# Patient Record
Sex: Female | Born: 1935 | Race: White | Hispanic: No | Smoking: Former smoker
Health system: Southern US, Community
[De-identification: ages and names within clinical notes are randomized; demographics above are authoritative.]

## PROBLEM LIST (undated history)

## (undated) DIAGNOSIS — I10 Essential (primary) hypertension: Secondary | ICD-10-CM

## (undated) DIAGNOSIS — I495 Sick sinus syndrome: Secondary | ICD-10-CM

## (undated) DIAGNOSIS — E785 Hyperlipidemia, unspecified: Secondary | ICD-10-CM

## (undated) DIAGNOSIS — M1990 Unspecified osteoarthritis, unspecified site: Secondary | ICD-10-CM

## (undated) HISTORY — DX: Essential (primary) hypertension: I10

## (undated) HISTORY — PX: TONSILLECTOMY: SUR1361

## (undated) HISTORY — PX: VESICOVAGINAL FISTULA CLOSURE W/ TAH: SUR271

## (undated) HISTORY — DX: Unspecified osteoarthritis, unspecified site: M19.90

## (undated) HISTORY — PX: ABDOMINAL HYSTERECTOMY: SHX81

## (undated) HISTORY — DX: Sick sinus syndrome: I49.5

## (undated) HISTORY — PX: APPENDECTOMY: SHX54

## (undated) HISTORY — DX: Hyperlipidemia, unspecified: E78.5

---

## 2008-03-02 ENCOUNTER — Encounter: Payer: Self-pay | Admitting: Family Medicine

## 2008-07-07 ENCOUNTER — Ambulatory Visit: Payer: Self-pay | Admitting: Family Medicine

## 2008-07-07 DIAGNOSIS — E785 Hyperlipidemia, unspecified: Secondary | ICD-10-CM | POA: Insufficient documentation

## 2008-07-07 DIAGNOSIS — M810 Age-related osteoporosis without current pathological fracture: Secondary | ICD-10-CM | POA: Insufficient documentation

## 2008-07-07 DIAGNOSIS — R3915 Urgency of urination: Secondary | ICD-10-CM | POA: Insufficient documentation

## 2008-07-07 DIAGNOSIS — G894 Chronic pain syndrome: Secondary | ICD-10-CM | POA: Insufficient documentation

## 2008-07-07 DIAGNOSIS — I10 Essential (primary) hypertension: Secondary | ICD-10-CM | POA: Insufficient documentation

## 2008-07-13 ENCOUNTER — Telehealth: Payer: Self-pay | Admitting: Family Medicine

## 2008-08-12 ENCOUNTER — Telehealth: Payer: Self-pay | Admitting: Family Medicine

## 2008-09-10 ENCOUNTER — Ambulatory Visit: Payer: Self-pay | Admitting: Family Medicine

## 2008-09-10 DIAGNOSIS — F341 Dysthymic disorder: Secondary | ICD-10-CM | POA: Insufficient documentation

## 2008-10-11 ENCOUNTER — Telehealth: Payer: Self-pay | Admitting: Family Medicine

## 2008-11-09 ENCOUNTER — Telehealth: Payer: Self-pay | Admitting: Family Medicine

## 2008-12-10 ENCOUNTER — Telehealth: Payer: Self-pay | Admitting: Family Medicine

## 2009-01-10 ENCOUNTER — Telehealth: Payer: Self-pay | Admitting: Family Medicine

## 2009-01-27 ENCOUNTER — Ambulatory Visit: Payer: Self-pay | Admitting: Family Medicine

## 2009-01-28 LAB — CONVERTED CEMR LAB
Chloride: 96 meq/L (ref 96–112)
GFR calc non Af Amer: 56.92 mL/min (ref >60)
Glucose, Bld: 96 mg/dL (ref 70–99)
Potassium: 4.2 meq/L (ref 3.5–5.1)
Sodium: 131 meq/L — ABNORMAL LOW (ref 135–145)
Vit D, 25-Hydroxy: 25 ng/mL — ABNORMAL LOW (ref 30–89)

## 2009-02-09 ENCOUNTER — Telehealth: Payer: Self-pay | Admitting: *Deleted

## 2009-03-09 ENCOUNTER — Ambulatory Visit: Payer: Self-pay | Admitting: Family Medicine

## 2009-03-10 ENCOUNTER — Telehealth: Payer: Self-pay | Admitting: Family Medicine

## 2009-03-10 LAB — CONVERTED CEMR LAB
CO2: 28 meq/L (ref 19–32)
Chloride: 95 meq/L — ABNORMAL LOW (ref 96–112)
Potassium: 4 meq/L (ref 3.5–5.1)

## 2009-04-06 ENCOUNTER — Ambulatory Visit: Payer: Self-pay | Admitting: Family Medicine

## 2009-04-06 DIAGNOSIS — E871 Hypo-osmolality and hyponatremia: Secondary | ICD-10-CM | POA: Insufficient documentation

## 2009-04-06 DIAGNOSIS — S62109A Fracture of unspecified carpal bone, unspecified wrist, initial encounter for closed fracture: Secondary | ICD-10-CM | POA: Insufficient documentation

## 2009-04-08 ENCOUNTER — Ambulatory Visit (HOSPITAL_BASED_OUTPATIENT_CLINIC_OR_DEPARTMENT_OTHER): Admission: RE | Admit: 2009-04-08 | Discharge: 2009-04-08 | Payer: Self-pay | Admitting: Orthopedic Surgery

## 2009-04-14 ENCOUNTER — Telehealth: Payer: Self-pay | Admitting: Family Medicine

## 2009-05-04 ENCOUNTER — Ambulatory Visit: Payer: Self-pay | Admitting: Family Medicine

## 2009-05-05 ENCOUNTER — Telehealth: Payer: Self-pay | Admitting: Family Medicine

## 2009-05-05 LAB — CONVERTED CEMR LAB: Vit D, 25-Hydroxy: 24 ng/mL — ABNORMAL LOW (ref 30–89)

## 2009-05-12 ENCOUNTER — Telehealth: Payer: Self-pay | Admitting: Family Medicine

## 2009-06-13 ENCOUNTER — Telehealth: Payer: Self-pay | Admitting: Family Medicine

## 2009-07-15 ENCOUNTER — Telehealth: Payer: Self-pay | Admitting: Family Medicine

## 2009-08-15 ENCOUNTER — Telehealth: Payer: Self-pay | Admitting: Family Medicine

## 2009-08-25 ENCOUNTER — Ambulatory Visit: Payer: Self-pay | Admitting: Family Medicine

## 2009-08-25 DIAGNOSIS — F22 Delusional disorders: Secondary | ICD-10-CM | POA: Insufficient documentation

## 2009-08-26 ENCOUNTER — Telehealth: Payer: Self-pay | Admitting: Family Medicine

## 2009-08-26 LAB — CONVERTED CEMR LAB
ALT: 20 units/L (ref 0–35)
Basophils Relative: 0.3 % (ref 0.0–3.0)
Bilirubin, Direct: 0.1 mg/dL (ref 0.0–0.3)
CO2: 30 meq/L (ref 19–32)
Chloride: 105 meq/L (ref 96–112)
Eosinophils Relative: 0.8 % (ref 0.0–5.0)
HCT: 36.4 % (ref 36.0–46.0)
Hemoglobin: 12.6 g/dL (ref 12.0–15.0)
Lymphs Abs: 2.3 10*3/uL (ref 0.7–4.0)
MCV: 97 fL (ref 78.0–100.0)
Monocytes Absolute: 0.8 10*3/uL (ref 0.1–1.0)
Neutro Abs: 5.1 10*3/uL (ref 1.4–7.7)
RBC: 3.75 M/uL — ABNORMAL LOW (ref 3.87–5.11)
Sodium: 143 meq/L (ref 135–145)
Total Protein: 6.9 g/dL (ref 6.0–8.3)
Vit D, 25-Hydroxy: 33 ng/mL (ref 30–89)
WBC: 8.3 10*3/uL (ref 4.5–10.5)

## 2009-09-13 ENCOUNTER — Telehealth: Payer: Self-pay | Admitting: Family Medicine

## 2009-09-16 ENCOUNTER — Telehealth: Payer: Self-pay | Admitting: Family Medicine

## 2009-10-17 ENCOUNTER — Telehealth: Payer: Self-pay | Admitting: Family Medicine

## 2009-11-16 ENCOUNTER — Telehealth: Payer: Self-pay | Admitting: Family Medicine

## 2009-11-26 ENCOUNTER — Encounter: Payer: Self-pay | Admitting: Internal Medicine

## 2009-12-16 ENCOUNTER — Telehealth: Payer: Self-pay | Admitting: Family Medicine

## 2009-12-22 ENCOUNTER — Telehealth: Payer: Self-pay | Admitting: Family Medicine

## 2010-01-18 ENCOUNTER — Telehealth: Payer: Self-pay | Admitting: Family Medicine

## 2010-01-23 ENCOUNTER — Ambulatory Visit: Payer: Self-pay | Admitting: Family Medicine

## 2010-01-23 DIAGNOSIS — R5383 Other fatigue: Secondary | ICD-10-CM

## 2010-01-23 DIAGNOSIS — D485 Neoplasm of uncertain behavior of skin: Secondary | ICD-10-CM | POA: Insufficient documentation

## 2010-01-23 DIAGNOSIS — R5381 Other malaise: Secondary | ICD-10-CM | POA: Insufficient documentation

## 2010-01-24 LAB — CONVERTED CEMR LAB
Basophils Absolute: 0 10*3/uL (ref 0.0–0.1)
Calcium: 8.7 mg/dL (ref 8.4–10.5)
GFR calc non Af Amer: 36.69 mL/min (ref >60)
Glucose, Bld: 94 mg/dL (ref 70–99)
Hemoglobin: 12.8 g/dL (ref 12.0–15.0)
Lymphocytes Relative: 34.3 % (ref 12.0–46.0)
Monocytes Relative: 10.2 % (ref 3.0–12.0)
Neutro Abs: 4 10*3/uL (ref 1.4–7.7)
Platelets: 258 10*3/uL (ref 150.0–400.0)
RDW: 12.9 % (ref 11.5–14.6)
Sodium: 135 meq/L (ref 135–145)
Vit D, 25-Hydroxy: 39 ng/mL (ref 30–89)

## 2010-01-26 ENCOUNTER — Encounter: Payer: Self-pay | Admitting: Internal Medicine

## 2010-01-26 ENCOUNTER — Ambulatory Visit: Payer: Self-pay

## 2010-01-26 ENCOUNTER — Encounter: Admission: RE | Admit: 2010-01-26 | Discharge: 2010-01-26 | Payer: Self-pay | Admitting: Family Medicine

## 2010-01-26 ENCOUNTER — Encounter: Payer: Self-pay | Admitting: Family Medicine

## 2010-02-01 ENCOUNTER — Ambulatory Visit: Payer: Self-pay | Admitting: Family Medicine

## 2010-02-01 ENCOUNTER — Telehealth: Payer: Self-pay | Admitting: Family Medicine

## 2010-02-08 ENCOUNTER — Encounter: Admission: RE | Admit: 2010-02-08 | Discharge: 2010-02-08 | Payer: Self-pay | Admitting: Family Medicine

## 2010-02-15 ENCOUNTER — Ambulatory Visit: Payer: Self-pay | Admitting: Family Medicine

## 2010-02-17 ENCOUNTER — Telehealth: Payer: Self-pay | Admitting: Family Medicine

## 2010-03-17 ENCOUNTER — Telehealth: Payer: Self-pay | Admitting: Family Medicine

## 2010-03-22 ENCOUNTER — Telehealth: Payer: Self-pay | Admitting: Family Medicine

## 2010-03-29 ENCOUNTER — Ambulatory Visit
Admission: RE | Admit: 2010-03-29 | Discharge: 2010-03-29 | Payer: Self-pay | Source: Home / Self Care | Attending: Family Medicine | Admitting: Family Medicine

## 2010-03-29 DIAGNOSIS — G4700 Insomnia, unspecified: Secondary | ICD-10-CM | POA: Insufficient documentation

## 2010-04-02 ENCOUNTER — Encounter: Payer: Self-pay | Admitting: Family Medicine

## 2010-04-11 NOTE — Progress Notes (Signed)
Summary: Pt req refill of Oxycontin 60mg  #90 tid, last filled 5/9  Phone Note Call from Patient Call back at [phone] Dianna   Caller: daughter in law - Dianna Summary of Call: Pt needs refill of Oxycontin 60mg  #90 three times a day.   Pls call when ready for pick up.  Initial call taken by: Braulio Bosch,  August 15, 2009 8:16 AM  Follow-up for Phone Call        #90 last filled on 5/9. Nira Conn LPN  June  6, 624THL [id number] AM   Additional Follow-up for Phone Call Additional follow up Details #1::        will refill Additional Follow-up by: Carolann Littler MD,  August 15, 2009 12:24 PM    Additional Follow-up for Phone Call Additional follow up Details #2::    [name] informed , she will pick-up Follow-up by: Nira Conn LPN,  June  6, 624THL 624THL PM  Prescriptions: OXYCONTIN 60 MG XR12H-TAB (OXYCODONE HCL) three times a day  #90 x 0   Entered and Authorized by:   Carolann Littler MD   Signed by:   Carolann Littler MD on 08/15/2009   Method used:   Print then Give to Patient   RxID:   [id number]

## 2010-04-11 NOTE — Procedures (Signed)
Summary: pacer check/[employer] jude   Current Medications (verified): 1)  Vitamin D 50000 Unit Caps (Ergocalciferol) .... One Tab Weekly 2)  Carvedilol 12.5 Mg Tabs (Carvedilol) .... One By Mouth Two Times A Day 3)  Hydralazine Hcl 25 Mg Tabs (Hydralazine Hcl) .... Two Times A Day 4)  Oxycontin 60 Mg Xr12h-Tab (Oxycodone Hcl) .... Three Times A Day 5)  Adult Aspirin Ec Low Strength 81 Mg Tbec (Aspirin) .... Once Daily 6)  Boniva 150 Mg Tabs (Ibandronate Sodium) .... One Tab Monthly 7)  Calcium Gluconate 500 Mg Tabs (Calcium Gluconate) .... Once Daily 8)  Centrum Silver Ultra Womens  Tabs (Multiple Vitamins-Minerals) .... Once Daily 9)  Haloperidol 1 Mg Tabs (Haloperidol) .... One By Mouth At Bedtime  Allergies (verified): No Known Drug Allergies  PPM Specifications Following MD:  Cristopher Peru, MD     Referring MD:  Romeo Apple, MD PPM Vendor:  [facility] Jude     PPM Model Number:  [id number]     PPM Serial Number:  [id number] PPM DOI:  08/27/2006     PPM Implanting MD:  NOT IMPLANTED BY [place]  Lead 1    Location: RA     DOI: 08/27/2006     Model #: [id number]     Serial #: [id number]     Status: active Lead 2    Location: RV     DOI: 08/27/2006     Model #: [id number]     Serial #: [id number]     Status: active  Magnet Response Rate:  BOL 98.6 ERI 86.3  Indications:  Loletha Grayer   PPM Follow Up Remote Check?  No Battery Voltage:  2.73 V     Battery Est. Longevity:  1 years     Pacer Dependent:  No       PPM Device Measurements Atrium  Amplitude: 1.4 mV, Impedance: 471 ohms, Threshold: 0.625 V at 0.4 msec Right Ventricle  Amplitude: 6.1 mV, Impedance: 756 ohms, Threshold: 1.375 V at 0.8 msec  Episodes MS Episodes:  0     Percent Mode Switch:  0     Coumadin:  No Ventricular High Rate:  4     Atrial Pacing:  85%     Ventricular Pacing:  <1%  Parameters Mode:  DDDR     Lower Rate Limit:  60     Upper Rate Limit:  110 Paced AV Delay:  275     Sensed AV Delay:  250 Next Cardiology Appt Due:  04/12/2010 Tech  Comments:  No parameter changes. Device function normal.  Battery nearing ERI.  4 VHR episodes 3-5 seconds.  ROV 3 months with Dr. Lovena Le. Alma Friendly, LPN  November 17, 624THL 2:28 PM

## 2010-04-11 NOTE — Assessment & Plan Note (Signed)
Summary: SKIN LESION EXCISION/BX/NJR   Vital Signs:  Patient profile:   77 year old female Weight:      154 pounds Temp:     98.8 degrees F oral BP sitting:   122 / 62  (left arm) Cuff size:   regular  Vitals Entered By: Nira Conn LPN (November 23, 624THL 11:30 AM)  History of Present Illness: Patient seen for skin lesion excision right wrist. Noted about one month ago. Nonpainful. No personal history of skin cancer. Concerning for possible squamous cell CA  Allergies (verified): No Known Drug Allergies  Past History:  Past Medical History: Last updated: 01/27/2009 Arthritis Hypertension Hyperlipidemia Osteoporosis sick sinus syndrome ? hx cva  Physical Exam  General:  Well-developed,well-nourished,in no acute distress; alert,appropriate and cooperative throughout examination Lungs:  Normal respiratory effort, chest expands symmetrically. Lungs are clear to auscultation, no crackles or wheezes. Heart:  normal rate and regular rhythm.   Skin:  patient has nodular lesion which is approximately 8 mm diameter right wrist dorsally. She has somewhat ulcerative surface. No pigment change   Impression & Recommendations:  Problem # 1:  NEOPLASM OF UNCERTAIN BEHAVIOR OF SKIN (ICD-238.2) risk and benefits of shave excision right wrist lesion discussed. Patient consented.  Anesth 1% Xylocaine with epinephrine. Prepped with Betadine. Using #15 blade shave excision without difficulty. Minimal bleeding controlled with drysol. Antibiotic dressing applied. Specimen sent to pathology for further evaluation Orders: Shave Skin Lesion 0.6-1.0 cm/trunk/arm/leg (11301)  Complete Medication List: 1)  Vitamin D 50000 Unit Caps (Ergocalciferol) .... One tab weekly 2)  Carvedilol 12.5 Mg Tabs (Carvedilol) .... One by mouth two times a day 3)  Hydralazine Hcl 25 Mg Tabs (Hydralazine hcl) .... Two times a day 4)  Oxycontin 60 Mg Xr12h-tab (Oxycodone hcl) .... Three times a day 5)  Adult  Aspirin Ec Low Strength 81 Mg Tbec (Aspirin) .... Once daily 6)  Boniva 150 Mg Tabs (Ibandronate sodium) .... One tab monthly 7)  Calcium Gluconate 500 Mg Tabs (Calcium gluconate) .... Once daily 8)  Centrum Silver Ultra Womens Tabs (Multiple vitamins-minerals) .... Once daily 9)  Haloperidol 1 Mg Tabs (Haloperidol) .... One by mouth at bedtime  Patient Instructions: 1)  Keep wound dry for the first 24 hours then clean with soap and water. 2)  Topical antibiotic daily for one week and keep covered with bandage for 4-5 dayd   Orders Added: 1)  Shave Skin Lesion 0.6-1.0 cm/trunk/arm/leg [11301]

## 2010-04-11 NOTE — Miscellaneous (Signed)
Summary: Device preload  Clinical Lists Changes  Observations: Added new observation of PPM INDICATN: Brady (11/26/2009 14:45) Added new observation of MAGNET RTE: BOL 98.6 ERI 86.3 (11/26/2009 14:45) Added new observation of PPMLEADSTAT2: active (11/26/2009 14:45) Added new observation of PPMLEADSER2: [id number] (11/26/2009 14:45) Added new observation of PPMLEADMOD2: 1788TC (11/26/2009 14:45) Added new observation of PPMLEADDOI2: 08/27/2006 (11/26/2009 14:45) Added new observation of PPMLEADLOC2: RV (11/26/2009 14:45) Added new observation of PPMLEADSTAT1: active (11/26/2009 14:45) Added new observation of PPMLEADSER1: [id number] (11/26/2009 14:45) Added new observation of PPMLEADMOD1: 1788TC (11/26/2009 14:45) Added new observation of PPMLEADDOI1: 08/27/2006 (11/26/2009 14:45) Added new observation of PPMLEADLOC1: RA (11/26/2009 14:45) Added new observation of PPM IMP MD: NOT IMPLANTED BY [place] (11/26/2009 14:45) Added new observation of PPM DOI: 08/27/2006 (11/26/2009 14:45) Added new observation of PPM SERL#: [id number]  (11/26/2009 14:45) Added new observation of PPM MODL#: [id number]  (11/26/2009 14:45) Added new observation of PACEMAKERMFG: [facility]  (11/26/2009 14:45) Added new observation of PPM REFER MD: Romeo Apple, MD  (11/26/2009 14:45) Added new observation of PACEMAKER MD: Cristopher Peru, MD  (11/26/2009 14:45)      PPM Specifications Following MD:  Cristopher Peru, MD     Referring MD:  Romeo Apple, MD PPM Vendor:  [facility]     PPM Model Number:  [id number]     PPM Serial Number:  [id number] PPM DOI:  08/27/2006     PPM Implanting MD:  NOT IMPLANTED BY [place]  Lead 1    Location: RA     DOI: 08/27/2006     Model #: [id number]     Serial #: [id number]     Status: active Lead 2    Location: RV     DOI: 08/27/2006     Model #: [id number]     Serial #: [id number]     Status: active  Magnet Response Rate:  BOL 98.6 ERI 86.3  Indications:  Heather Jarvis

## 2010-04-11 NOTE — Assessment & Plan Note (Signed)
Summary: CPX (PT WILL COME IN FASTING) // RS/PTS DAUGHTER RSC/CJR   Vital Signs:  Patient profile:   77 year old female Height:      60.5 inches Weight:      152 pounds Temp:     98.8 degrees F oral Pulse rate:   72 / minute Pulse rhythm:   regular Resp:     12 per minute BP sitting:   130 / 68  (left arm) Cuff size:   regular  Vitals Entered By: Nira Conn LPN (November 14, 624THL 10:37 AM)  History of Present Illness: Pt here for medicare wellness visit and multiple med problem follow up.  Here for Medicare AWV:  1.   Risk factors based on Past M, S, F history:  Hx osteoporosis, depression, osteoarthritis, hypertension, hyperlipidemia, and delusional disorder controlled with Haldol.  FH of heart disease and diabetes.  Pt has prior hx of ?sick sinus syndrome and ?past hx of CVA. 2.   Physical Activities: walks twice daily for exercise. 3.   Depression/mood: past hx of depression but currently stable. 4.   Hearing: no gross defecits. 5.   ADL's: Independent in all ADLs. 6.   Fall Risk: risks include osteoarthritis and some mild visual difficulty.  No recent falls and steady on feet. 7.   Home Safety: No major concerns identified.  Discussed fall risks such as throw rugs. 8.   Height, weight, &visual acuity:vision tested here today and 20/50 OD and OS but pt did not have here glasses with her (she uses bifocals)  Weight and height are stable. 9.   Counseling: regular weight bearing exercise.  Need for yearly mammogram.  No need for pap as hx hysterctomy for benign diseasse. 10.   Labs ordered based on risk factors: Vit D, CBC, and BMP. 11.           Referral Coordination  no referrals needed at this time.  She is scheduled to see me back for skin lesion excision. 12.           Care Plan  Flu vaccine given,  labs as above.  Skin lesion excision to be scheduled.  Discussed calcium and Vit D use.  Needs f/u DEXA and will schedule with mammogram later this month 13.            Cognitive  Assessment  oriented to time, place, and person.  No short term memory defecits  Osteoporosis hx on Boniva and takes regularly along with Ca and Vit D.  No recent falls. No recent DEXA.  Walks for exercise.  Hx chronic low back pain and has been on Oxycontin for years with good control.  No constipation or other side effects.  Past hx of some mostly nightime delusions which have been well controlled on low dose Haldol.  no side effects but pt and daughter in law state she is not sleepingas well and request increase in dose.  No extrapyramidal side effects.  hypertension on mult meds with no side effects.  New problem of R wrist dorsal skin nodule present for at least a few weeks and seems to be  growing rapidly.  Nonpainful.  Hypertension History:      She denies headache, chest pain, palpitations, dyspnea with exertion, orthopnea, peripheral edema, visual symptoms, syncope, and side effects from treatment.  She notes no problems with any antihypertensive medication side effects.        Positive major cardiovascular risk factors include female age 35 years old or  older, hyperlipidemia, and hypertension.  Negative major cardiovascular risk factors include non-tobacco-user status.     Clinical Review Panels:  Immunizations   Last Flu Vaccine:  Fluvax 3+ (01/23/2010)   Last Pneumovax:  given (01/15/2007)   Allergies (verified): No Known Drug Allergies  Past History:  Past Medical History: Last updated: 01/27/2009 Arthritis Hypertension Hyperlipidemia Osteoporosis sick sinus syndrome ? hx cva  Past Surgical History: Last updated: 07/07/2008 Appendectomy Hysterectomy Tonsillectomy  Family History: Last updated: 01/27/2009  Family History Hypertension mother, sister Heart disease, mother, brother Diabetes, mother  Social History: Last updated: 07/07/2008 Retired Widow/Widower Never Smoked Alcohol use-no  Risk Factors: Smoking Status: never  (07/07/2008) PMH-FH-SH reviewed for relevance  Review of Systems  The patient denies anorexia, fever, weight loss, weight gain, vision loss, decreased hearing, hoarseness, chest pain, syncope, dyspnea on exertion, peripheral edema, prolonged cough, headaches, hemoptysis, abdominal pain, melena, hematochezia, severe indigestion/heartburn, hematuria, muscle weakness, depression, enlarged lymph nodes, and breast masses.         has some fatigue issues off and on.  Sleeping is erratic but delusions have been controlled.  Physical Exam  General:  Well-developed,well-nourished,in no acute distress; alert,appropriate and cooperative throughout examination Head:  Normocephalic and atraumatic without obvious abnormalities. No apparent alopecia or balding. Eyes:  pupils equal, pupils round, and pupils reactive to light.   Ears:  cerumen R ear Mouth:  Oral mucosa and oropharynx without lesions or exudates.  Teeth in good repair. Neck:  No deformities, masses, or tenderness noted. Chest Wall:  She has implanted portacath L upper chest wall from prior hospitalization few years ago. Breasts:  No mass, nodules, thickening, tenderness, bulging, retraction, inflamation, nipple discharge or skin changes noted.   Lungs:  Normal respiratory effort, chest expands symmetrically. Lungs are clear to auscultation, no crackles or wheezes. Heart:  normal rate and regular rhythm.   Abdomen:  Bowel sounds positive,abdomen soft and non-tender without masses, organomegaly or hernias noted. Genitalia:  hx TAH Msk:  No deformity or scoliosis noted of thoracic or lumbar spine.   Extremities:  No clubbing, cyanosis, edema, or deformity noted with normal full range of motion of all joints.   Neurologic:  alert & oriented X3, cranial nerves II-XII intact, strength normal in all extremities, and gait normal.   Skin:  R wrist nodular lesion about 40mm with ulcerative surface. Cervical Nodes:  No lymphadenopathy noted Psych:   Oriented X3, normally interactive, good eye contact, not anxious appearing, and not depressed appearing.     Impression & Recommendations:  Problem # 1:  Preventive Health Care (ICD-V70.0) flu vaccine given.  mammogram has been scheduled and add DEXA, no indication for pap.  Problem # 2:  DELUSIONAL DISORDER (ICD-297.1) controlled but poor sleep.  Titrate to 1 mg at bedtime.  Problem # 3:  DEPRESSION/ANXIETY (ICD-300.4) Assessment: Unchanged stable.  Problem # 4:  CHRONIC PAIN SYNDROME (ICD-338.4) stable on chronic pain meds.  Problem # 5:  OSTEOPOROSIS (ICD-733.00) samples Boniva given.  check Vit D level. adn repeat DEXA. Her updated medication list for this problem includes:    Vitamin D 50000 Unit Caps (Ergocalciferol) ..... One tab weekly    Boniva 150 Mg Tabs (Ibandronate sodium) ..... One tab monthly    Calcium Gluconate 500 Mg Tabs (Calcium gluconate) ..... Once daily  Orders: T-Vitamin D (25-Hydroxy) [id number]) Venipuncture [id number]) Specimen Handling (99000)  Problem # 6:  HYPERTENSION (ICD-401.9)  Her updated medication list for this problem includes:    Carvedilol 12.5 Mg Tabs (Carvedilol) .Marland KitchenMarland KitchenMarland KitchenMarland Kitchen  One by mouth two times a day    Hydralazine Hcl 25 Mg Tabs (Hydralazine hcl) .Marland Kitchen..Marland Kitchen Two times a day    Lisinopril-hydrochlorothiazide 20-12.5 Mg Tabs (Lisinopril-hydrochlorothiazide) ..... One by mouth daily  Orders: TLB-BMP (Basic Metabolic Panel-BMET) (99991111)  Problem # 7:  NEOPLASM OF UNCERTAIN BEHAVIOR OF SKIN (ICD-238.2) Assessment: New pt will schedule for skin bx.  Suspect possible squamous cell cancer.  Complete Medication List: 1)  Vitamin D 50000 Unit Caps (Ergocalciferol) .... One tab weekly 2)  Carvedilol 12.5 Mg Tabs (Carvedilol) .... One by mouth two times a day 3)  Hydralazine Hcl 25 Mg Tabs (Hydralazine hcl) .... Two times a day 4)  Oxycontin 60 Mg Xr12h-tab (Oxycodone hcl) .... Three times a day 5)  Adult Aspirin Ec Low Strength 81 Mg Tbec  (Aspirin) .... Once daily 6)  Lisinopril-hydrochlorothiazide 20-12.5 Mg Tabs (Lisinopril-hydrochlorothiazide) .... One by mouth daily 7)  Boniva 150 Mg Tabs (Ibandronate sodium) .... One tab monthly 8)  Citalopram Hydrobromide 20 Mg Tabs (Citalopram hydrobromide) .... One by mouth once daily 9)  Calcium Gluconate 500 Mg Tabs (Calcium gluconate) .... Once daily 10)  Centrum Silver Ultra Womens Tabs (Multiple vitamins-minerals) .... Once daily 11)  Haloperidol 1 Mg Tabs (Haloperidol) .... One by mouth at bedtime  Other Orders: Flu Vaccine 22yrs + MEDICARE PATIENTS [id number]) Administration Flu vaccine - MCR (G0008) TLB-CBC Platelet - w/Differential (85025-CBCD) Medicare -1st Annual Wellness Visit [phone])  Hypertension Assessment/Plan:      The patient's hypertensive risk group is category B: At least one risk factor (excluding diabetes) with no target organ damage.  Today's blood pressure is 130/68.    Patient Instructions: 1)  Schedule follow up in 2-3 weeks for skin lesion excision/bx. Prescriptions: HALOPERIDOL 1 MG TABS (HALOPERIDOL) one by mouth at bedtime  #30 x 6   Entered and Authorized by:   Carolann Littler MD   Signed by:   Carolann Littler MD on 01/23/2010   Method used:   Electronically to        [facility]. [phone]* (retail)       [place].       [place], [place]  [address]       Ph: [phone]       Fax: [phone]   RxID:   [id number]    Orders Added: 1)  Flu Vaccine 32yrs + MEDICARE PATIENTS [Q2039] 2)  Administration Flu vaccine - MCR [id number] 3)  T-Vitamin D (25-Hydroxy) [id number] 4)  Venipuncture [id number])  Specimen Handling [id number])  TLB-BMP (Basic Metabolic Panel-BMET) [id number] 7)  TLB-CBC Platelet - w/Differential [85025-CBCD] 8)  Medicare -1st Annual Wellness Visit [id number] 9)  Est. Patient Level IV [id number]   Flu Vaccine Consent Questions     Do you have a history of severe allergic reactions  to this vaccine? no    Any prior history of allergic reactions to egg and/or gelatin? no    Do you have a sensitivity to the preservative Thimersol? no    Do you have a past history of Guillan-Barre Syndrome? no    Do you currently have an acute febrile illness? no    Have you ever had a severe reaction to latex? no    Vaccine information given and explained to patient? yes    Are you currently pregnant? no    Lot Number:[id number]   Exp Date:09/09/2010   Site Given  Left Deltoid [email or web address]   Preventive Care Screening  Last Flu Shot:    Date:  01/23/2010    Results:  Fluvax 3+  Last Pneumovax:    Date:  01/15/2007    Results:  given

## 2010-04-11 NOTE — Progress Notes (Signed)
Summary: Pt req refill of Oxycotin 60mg   Phone Note Call from Patient Call back at Home Phone [phone]   Caller: daughter in law - Dianna Summary of Call: Pt req refill Oxycotin 60mg . Please call when ready for pick up.  Initial call taken by: Braulio Bosch,  April 14, 2009 9:32 AM  Follow-up for Phone Call        One tab three times a day, #90 RF 0, last filled 1-3 Nira Conn LPN  February  3, 624THL 9:55 AM will refil Follow-up by: Carolann Littler MD,  April 14, 2009 10:07 AM  Additional Follow-up for Phone Call Additional follow up Details #1::        Daughter in law [name] informed Rx ready for pick-up Additional Follow-up by: Nira Conn LPN,  February  3, 624THL 10:11 AM    Prescriptions: OXYCONTIN 60 MG XR12H-TAB (OXYCODONE HCL) three times a day  #90 x 0   Entered and Authorized by:   Carolann Littler MD   Signed by:   Carolann Littler MD on 04/14/2009   Method used:   Print then Give to Patient   RxID:   [id number]

## 2010-04-11 NOTE — Assessment & Plan Note (Signed)
Summary: fu on meds/njr/pt rsc/cjr   Vital Signs:  Patient profile:   77 year old female Weight:      153 pounds BMI:     29.50 Temp:     98.3 degrees F oral Pulse rate:   60 / minute Pulse rhythm:   regular Resp:     12 per minute BP sitting:   120 / 68  (left arm) Cuff size:   regular  Vitals Entered By: Nira Conn LPN (June 16, 624THL D34-534 AM)  Nutrition Counseling: Patient's BMI is greater than 25 and therefore counseled on weight management options.  History of Present Illness:  follow multiple medical problems as follows.  hypertension treated with multiple medications. Compliant with medications. No side effects. Family requesting prescription for home blood pressure monitor.  Chronic pain syndrome treated with OxyContin. Pain well controlled. They have tried reducing this to twice daily but she's had significant breakthrough pain. No history of medication misuse. They never controlled with over-the-counter medications.  History osteoporosis. Low vitamin D levels. Takes Boniva monthly along with calcium and vitamin D. She is on prescription vitamin D. Last vitamin D level repeat in 24.  History of hyponatremia sodium 130 months ago. Low-dose diuretic use. Good appetite and intake.  Daughter-in-law and patient relate a history of delusions. She has had these for several years but never mentioned this previously. First noted when she lived in [place]. She frequently hears voices but has no hallucinations visually. She has delusional thoughts of someone being under the house and someone putting stuff in her hair frequently. Daughter-in-law relates when she lived in [place] one point she boarded up sec to fear. Chronic poor sleep. No history of being treated for delusional disorder. Symptoms wax and wane.    Allergies (verified): No Known Drug Allergies  Past History:  Past Medical History: Last updated:  01/27/2009 Arthritis Hypertension Hyperlipidemia Osteoporosis sick sinus syndrome ? hx cva  Past Surgical History: Last updated: 07/07/2008 Appendectomy Hysterectomy Tonsillectomy  Family History: Last updated: 01/27/2009  Family History Hypertension mother, sister Heart disease, mother, brother Diabetes, mother  Social History: Last updated: 07/07/2008 Retired Widow/Widower Never Smoked Alcohol use-no  Risk Factors: Smoking Status: never (07/07/2008)  Review of Systems  The patient denies anorexia, fever, weight loss, weight gain, chest pain, syncope, dyspnea on exertion, peripheral edema, prolonged cough, headaches, hemoptysis, abdominal pain, melena, hematochezia, severe indigestion/heartburn, hematuria, and muscle weakness.    Physical Exam  General:  Well-developed,well-nourished,in no acute distress; alert,appropriate and cooperative throughout examination Head:  Normocephalic and atraumatic without obvious abnormalities. No apparent alopecia or balding. Eyes:  No corneal or conjunctival inflammation noted. EOMI. Perrla. Funduscopic exam benign, without hemorrhages, exudates or papilledema. Vision grossly normal. Ears:  External ear exam shows no significant lesions or deformities.  Otoscopic examination reveals clear canals, tympanic membranes are intact bilaterally without bulging, retraction, inflammation or discharge. Hearing is grossly normal bilaterally. Mouth:  Oral mucosa and oropharynx without lesions or exudates.  Teeth in good repair. Neck:  No deformities, masses, or tenderness noted. Lungs:  Normal respiratory effort, chest expands symmetrically. Lungs are clear to auscultation, no crackles or wheezes. Heart:  normal rate and regular rhythm.  soft [id number] systolic murmur right upper sternal border Abdomen:  soft and non-tender.   Extremities:  no edema Neurologic:  alert & oriented X3, cranial nerves II-XII intact, and gait normal.   Psych:  Oriented  X3, normally interactive, good eye contact, not depressed appearing, and slightly anxious.     Impression &  Recommendations:  Problem # 1:  HYPERTENSION (ICD-401.9) stable and at goal. Her updated medication list for this problem includes:    Carvedilol 12.5 Mg Tabs (Carvedilol) ..... One by mouth two times a day    Hydralazine Hcl 25 Mg Tabs (Hydralazine hcl) .Marland Kitchen..Marland Kitchen Two times a day    Lisinopril-hydrochlorothiazide 20-12.5 Mg Tabs (Lisinopril-hydrochlorothiazide) ..... One by mouth daily  Orders: Prescription Created Electronically [phone]) TLB-BMP (Basic Metabolic Panel-BMET) (99991111)  Problem # 2:  OSTEOPOROSIS (ICD-733.00)  Her updated medication list for this problem includes:    Vitamin D 50000 Unit Caps (Ergocalciferol) ..... One tab weekly    Boniva 150 Mg Tabs (Ibandronate sodium) ..... One tab monthly    Calcium Gluconate 500 Mg Tabs (Calcium gluconate) ..... Once daily  Orders: T-Vitamin D (25-Hydroxy) [phone]) Venipuncture HR:[id number])  Problem # 3:  HYPONATREMIA (ICD-276.1) repeat BMP. Orders: TLB-BMP (Basic Metabolic Panel-BMET) (99991111)  Problem # 4:  DELUSIONAL DISORDER (ICD-297.1) Assessment: New Discussed with the patient and daughter-in-law consideration for referral to psychiatry and they will consider. Orders: TLB-TSH (Thyroid Stimulating Hormone) (84443-TSH) TLB-CBC Platelet - w/Differential (85025-CBCD) TLB-Hepatic/Liver Function Pnl (80076-HEPATIC) Venipuncture HR:[id number])  Problem # 5:  DEPRESSION/ANXIETY (ICD-300.4) continue citalopram  Complete Medication List: 1)  Vitamin D 50000 Unit Caps (Ergocalciferol) .... One tab weekly 2)  Carvedilol 12.5 Mg Tabs (Carvedilol) .... One by mouth two times a day 3)  Hydralazine Hcl 25 Mg Tabs (Hydralazine hcl) .... Two times a day 4)  Oxycontin 60 Mg Xr12h-tab (Oxycodone hcl) .... Three times a day 5)  Adult Aspirin Ec Low Strength 81 Mg Tbec (Aspirin) .... Once daily 6)   Lisinopril-hydrochlorothiazide 20-12.5 Mg Tabs (Lisinopril-hydrochlorothiazide) .... One by mouth daily 7)  Boniva 150 Mg Tabs (Ibandronate sodium) .... One tab monthly 8)  Citalopram Hydrobromide 20 Mg Tabs (Citalopram hydrobromide) .... One by mouth once daily 9)  Calcium Gluconate 500 Mg Tabs (Calcium gluconate) .... Once daily 10)  Centrum Silver Ultra Womens Tabs (Multiple vitamins-minerals) .... Once daily  Patient Instructions: 1)  Please schedule a follow-up appointment in 3 months .  Prescriptions: HYDRALAZINE HCL 25 MG TABS (HYDRALAZINE HCL) two times a day  #180 Tablet x 3   Entered and Authorized by:   Carolann Littler MD   Signed by:   Carolann Littler MD on 08/25/2009   Method used:   Electronically to        [facility]. [phone]* (retail)       [address]       [place], [place]  [address]       Ph: [phone]       Fax: [phone]   [id number] CITALOPRAM HYDROBROMIDE 20 MG TABS (CITALOPRAM HYDROBROMIDE) one by mouth once daily  #90 x 3   Entered and Authorized by:   Carolann Littler MD   Signed by:   Carolann Littler MD on 08/25/2009   Method used:   Electronically to        [facility]. [phone]* (retail)       [address].       [place], [place]  [address]       Ph: [phone]       Fax: [phone]   RxID:   [id number] CARVEDILOL 12.5 MG TABS (CARVEDILOL) one by mouth two times a day  #60 x 11   Entered and Authorized by:   Carolann Littler  MD   Signed by:   Carolann Littler MD on 08/25/2009   Method used:   Electronically to        [facility]. [phone]* (retail)       [address].       [place], [place]  [address]       Ph: [phone]       Fax: [phone]   RxID:   [id number]

## 2010-04-11 NOTE — Progress Notes (Signed)
Summary: new rx  Phone Note Refill Request Message from:  Patient  Refills Requested: Medication #1:  OXYCONTIN 60 MG XR12H-TAB three times a day please call carl (son) when ready for pick up [phone]. pt last refill on 11-16-2009  Initial call taken by: Glo Herring,  December 16, 2009 9:51 AM  Follow-up for Phone Call        done Follow-up by: Laurey Morale MD,  December 16, 2009 2:15 PM  Additional Follow-up for Phone Call Additional follow up Details #1::        informed son rx at front desk. informed if came today had to be here by 5:15  verbalized understanding  Additional Follow-up by: Levora Angel, RN,  December 16, 2009 4:29 PM    Prescriptions: OXYCONTIN 60 MG XR12H-TAB (OXYCODONE HCL) three times a day  #90 x 0   Entered and Authorized by:   Laurey Morale MD   Signed by:   Laurey Morale MD on 12/16/2009   Method used:   Print then Give to Patient   RxID:   [id number]

## 2010-04-11 NOTE — Progress Notes (Signed)
Summary:  rx Oxycontin refill  Phone Note Call from Patient Call back at Home Phone [phone] Call back at Work Phone [phone]   Caller: Other Relative Call For: Carolann Littler MD Summary of Call: pt need new rx for oxycontin 60 mg Initial call taken by: Glo Herring,  Jul 15, 2009 8:59 AM  Follow-up for Phone Call        will refill Follow-up by: Carolann Littler MD,  Jul 15, 2009 10:14 AM  Additional Follow-up for Phone Call Additional follow up Details #1::        Family informed Rx ready for pick-up, OK for [name] to pick-up Additional Follow-up by: Nira Conn LPN,  May  6, 624THL D34-534 AM    Prescriptions: OXYCONTIN 60 MG XR12H-TAB (OXYCODONE HCL) three times a day  #90 x 0   Entered and Authorized by:   Carolann Littler MD   Signed by:   Carolann Littler MD on 07/15/2009   Method used:   Print then Give to Patient   RxID:   [id number]

## 2010-04-11 NOTE — Progress Notes (Signed)
Summary: REFILL REQUEST Oxycontin  Phone Note Refill Request Call back at Work Phone [phone] Message from:  Patient's Dghtr-n-law [name]) on January 18, 2010 3:06 PM  Refills Requested: Medication #1:  [id number] MG XR12H-TAB three times a day   Notes: Pts dghtr in law [name]) can be reached at [phone] when Rx is ready for p/u.    Initial call taken by: Duanne Moron,  January 18, 2010 3:06 PM  Follow-up for Phone Call        Last filled 10/7 Nira Conn LPN  November  9, 624THL 4:10 PM will refill.  Pt needs to schedule office f/u Follow-up by: Carolann Littler MD,  January 19, 2010 12:30 PM  Additional Follow-up for Phone Call Additional follow up Details #1::        Pt daughter in law informed, has OV scheduled for next week Additional Follow-up by: Nira Conn LPN,  November 10, 624THL 1:46 PM    Prescriptions: OXYCONTIN 60 MG XR12H-TAB (OXYCODONE HCL) three times a day  #90 x 0   Entered and Authorized by:   Carolann Littler MD   Signed by:   Carolann Littler MD on 01/19/2010   Method used:   Print then Give to Patient   RxID:   [id number]

## 2010-04-11 NOTE — Progress Notes (Signed)
Summary: REFILL REQUEST Haloperidol  Phone Note Call from Patient   Caller: Daughter-N-Law Summary of Call: Pts daughter-n-law [name]) called to adv that she has made appt for pt on 11/3  at  10:30am for cpx / labwork..... She is currently out of state taking care of her terminally ill mother and can't bring pt (callers mother-n-law) in till she returns the week of 11/3..... Req that Rx for enough of medication (HALOPERIDOL 0.5 MG) be sent to the pharmacy ([facility], [address]) till she can bring pt in for cpx appt..... George Ina she can be reached at [phone] with any questions or concerns.  Initial call taken by: Duanne Moron,  December 22, 2009 3:49 PM  Follow-up for Phone Call        Last filled on 9/13 [facility] LPN  October 13, 624THL 4:52 PM OK to refill once until follow up,. Follow-up by: Carolann Littler MD,  December 22, 2009 5:59 PM  Additional Follow-up for Phone Call Additional follow up Details #1::        Daughter in law informed Additional Follow-up by: Nira Conn LPN,  October 14, 624THL 11:26 AM    Prescriptions: HALOPERIDOL 0.5 MG TABS (HALOPERIDOL) one by mouth at bedtime.  #30 Tablet x 0   Entered by:   Nira Conn LPN   Authorized by:   Carolann Littler MD   Signed by:   Nira Conn LPN on [id number]   Method used:   Electronically to        [facility]. [phone]* (retail)       [address].       [place], [place]  [address]       Ph: [phone]       Fax: [phone]   RxID:   [id number]

## 2010-04-11 NOTE — Progress Notes (Signed)
Summary: Order for DEXA Scan  Order for DEXA Scan   Imported By: Laural Benes 01/25/2010 15:35:46  _____________________________________________________________________  External Attachment:    Type:   Image     Comment:   External Document

## 2010-04-11 NOTE — Progress Notes (Signed)
Summary: Office staff cannot find Rx Oxycodone, reprinted  Phone Note Call from Patient   Caller: Patient Call For: Carolann Littler MD Summary of Call: Pt son here to pick-up Oxycodone, front office staff cannot find it.  Re-Printed Initial call taken by: Nira Conn LPN,  July  8, 624THL 579FGE PM  Follow-up for Phone Call        Dr Elease Hashimoto signed, rx given to son as instructed Follow-up by: Nira Conn LPN,  July  8, 624THL 624THL PM    Prescriptions: OXYCONTIN 60 MG XR12H-TAB (OXYCODONE HCL) three times a day  #90 x 0   Entered by:   Nira Conn LPN   Authorized by:   Carolann Littler MD   Signed by:   Nira Conn LPN on 075-GRM   Method used:   Print then Give to Patient   RxID:   [id number]

## 2010-04-11 NOTE — Progress Notes (Signed)
Summary: return call regarding Vit D  Phone Note Call from Patient   Caller: Daughter in law [name] Call For: Izora Gala Summary of Call: returning your call   [phone] Initial call taken by: Chipper Oman, RN,  May 05, 2009 9:10 AM  Follow-up for Phone Call        Daughter in law report pt is consistantly taking the 50,000 units of Vit D weekly and 1,000 units daily already. Follow-up by: Nira Conn LPN,  February 24, 624THL 9:35 AM  Additional Follow-up for Phone Call Additional follow up Details #1::        increase daily to 2,000 International Units and recheck 3 months Additional Follow-up by: Carolann Littler MD,  May 05, 2009 11:46 AM    Additional Follow-up for Phone Call Additional follow up Details #2::    [name] informed, they will recheck Vit D in 3 months Follow-up by: Nira Conn LPN,  February 24, 624THL 12:31 PM

## 2010-04-11 NOTE — Progress Notes (Signed)
Summary: Pt req script for Oxycontin 60mg    Phone Note Refill Request Message from:  daughter-Dianna on September 13, 2009 1:28 PM  Refills Requested: Medication #1:  OXYCONTIN 60 MG XR12H-TAB three times a day   Dosage confirmed as above?Dosage Confirmed  Method Requested: Pick up at Office Initial call taken by: Braulio Bosch,  September 13, 2009 1:28 PM  Follow-up for Phone Call        Last filled 08-15-2009 Nira Conn LPN  July  6, 624THL [id number] AM refill Follow-up by: Carolann Littler MD,  September 15, 2009 8:10 AM  Additional Follow-up for Phone Call Additional follow up Details #1::        Daughter [name] informed RX ready for pick-up.  Naida Sleight (s0n) authorized to pick-up Additional Follow-up by: Nira Conn LPN,  July  7, 624THL 579FGE AM    Prescriptions: OXYCONTIN 60 MG XR12H-TAB (OXYCODONE HCL) three times a day  #90 x 0   Entered and Authorized by:   Carolann Littler MD   Signed by:   Carolann Littler MD on 09/15/2009   Method used:   Print then Give to Patient   RxID:   [id number]

## 2010-04-11 NOTE — Progress Notes (Signed)
Summary: Refill Oxycodone request  Phone Note Call from Patient Call back at Home Phone [phone]   Caller: Daughter-diana Call For: Carolann Littler MD Summary of Call: pt needs new rx oxycontin 60mg  Initial call taken by: Glo Herring,  October 17, 2009 8:40 AM  Follow-up for Phone Call        refill Follow-up by: Carolann Littler MD,  October 17, 2009 1:12 PM  Additional Follow-up for Phone Call Additional follow up Details #1::        Daughter informed RX ready for pick-up Additional Follow-up by: Nira Conn LPN,  August  8, 624THL 1:37 PM    Prescriptions: OXYCONTIN 60 MG XR12H-TAB (OXYCODONE HCL) three times a day  #90 x 0   Entered and Authorized by:   Carolann Littler MD   Signed by:   Carolann Littler MD on 10/17/2009   Method used:   Print then Give to Patient   RxID:   [id number]

## 2010-04-11 NOTE — Progress Notes (Signed)
Summary: Daughter requesting phone call  Phone Note Call from Patient   Caller: Daughter Call For: Carolann Littler MD Complaint: Breathing Problems Summary of Call: Daughter requesting Dr Elease Hashimoto call to discuss her mother's thoughts [phone] Initial call taken by: Nira Conn LPN,  June 17, 624THL [id number] AM  Follow-up for Phone Call        spoke with daughter-in-law.  She has delusions as noted and is very resistent to idea of seeing a psychiatrist.  Poor sleep at night.  We will try low dose Haldol .5 mg at bedtime and office f/u one month. Follow-up by: Carolann Littler MD,  August 26, 2009 5:50 PM    New/Updated Medications: HALOPERIDOL 0.5 MG TABS (HALOPERIDOL) one by mouth at bedtime. Prescriptions: HALOPERIDOL 0.5 MG TABS (HALOPERIDOL) one by mouth at bedtime.  #30 x 1   Entered and Authorized by:   Carolann Littler MD   Signed by:   Carolann Littler MD on 08/26/2009   Method used:   Electronically to        [facility]. [phone]* (retail)       [address].       [place], [place]  [address]       Ph: [phone]       Fax: [phone]   RxID:   [id number]

## 2010-04-11 NOTE — Progress Notes (Signed)
Summary: Pt needs refill of Oxycontin 40mg  XR  Phone Note Refill Request Message from:  daughter in law [name] on February 17, 2010 8:10 AM  Refills Requested: Medication #1:  OXYCONTIN 40 MG XR12H-TAB one by mouth three times a day   Dosage confirmed as above?Dosage Confirmed   Supply Requested: 1 month  Method Requested: Pick up at Office Initial call taken by: Braulio Bosch,  February 17, 2010 8:11 AM  Follow-up for Phone Call        will refill Follow-up by: Carolann Littler MD,  February 17, 2010 10:35 AM  Additional Follow-up for Phone Call Additional follow up Details #1::        daughter in law aware and will pick up  Additional Follow-up by: Levora Angel, RN,  February 17, 2010 10:50 AM    Prescriptions: OXYCONTIN 40 MG XR12H-TAB (OXYCODONE HCL) one by mouth three times a day  #90 x 0   Entered and Authorized by:   Carolann Littler MD   Signed by:   Carolann Littler MD on 02/17/2010   Method used:   Print then Give to Patient   RxID:   [id number]

## 2010-04-11 NOTE — Progress Notes (Signed)
Summary: refill Oxycontin, last filled 05/12/09  Phone Note Refill Request Call back at Home Phone [phone] Message from:  daughter in law----diana  Refills Requested: Medication #1:  OXYCONTIN 60 MG XR12H-TAB three times a day call when ready.  Initial call taken by: Despina Arias,  June 13, 2009 1:59 PM  Follow-up for Phone Call        Last refill #90, 0 refills on 05/12/2009 Nira Conn LPN  April  4, 624THL QA348G PM refil Follow-up by: Carolann Littler MD,  June 14, 2009 7:50 AM  Additional Follow-up for Phone Call Additional follow up Details #1::        Pt daughter in [place] informed Additional Follow-up by: Nira Conn LPN,  April  5, 624THL 8:29 AM    Prescriptions: OXYCONTIN 60 MG XR12H-TAB (OXYCODONE HCL) three times a day  #90 x 0   Entered and Authorized by:   Carolann Littler MD   Signed by:   Carolann Littler MD on 06/14/2009   Method used:   Print then Give to Patient   RxID:   [id number]

## 2010-04-11 NOTE — Assessment & Plan Note (Signed)
Summary: Follow-up low sodium/nn   Vital Signs:  Patient profile:   77 year old female Weight:      153 pounds Temp:     99.7 degrees F oral BP sitting:   146 / 80  (left arm) Cuff size:   regular  Vitals Entered By: Nira Conn LPN (January 26, 624THL 1:16 PM) CC: follow-up low sodium, fell last night, left arm pain   History of Present Illness: Patient seen for the following items today.  Hypertension treated with lisinopril HCTZ, hydralazine, and carvedilol.  In November we increased her lisinopril HCTZ. Recent sodium low at 130. Good appetite and intake. Daughter-in-law restricts sodium in her diet generally.  Denies any weakness, nausea, headaches or other complaints. No recent orthostatic symptoms, dizziness, or chest pains.  Tripped on a rug and fell last night. Left wrist pain and swelling and ecchymosis. Also complains of some dorsal left foot pain but no swelling there. Ambulating okay. No head injury. Denies any elbow pain, shoulder pain, or hip pain.  Allergies: No Known Drug Allergies  Past History:  Past Medical History: Last updated: 01/27/2009 Arthritis Hypertension Hyperlipidemia Osteoporosis sick sinus syndrome ? hx cva  Past Surgical History: Last updated: 07/07/2008 Appendectomy Hysterectomy Tonsillectomy  Social History: Last updated: 07/07/2008 Retired Widow/Widower Never Smoked Alcohol use-no  Review of Systems  The patient denies anorexia, fever, weight loss, weight gain, vision loss, chest pain, syncope, dyspnea on exertion, peripheral edema, prolonged cough, headaches, hemoptysis, abdominal pain, and muscle weakness.    Physical Exam  General:  patient is alert cooperative and in no distress Head:  Normocephalic and atraumatic without obvious abnormalities. No apparent alopecia or balding. Mouth:  Oral mucosa and oropharynx without lesions or exudates.  Teeth in good repair. Neck:  No deformities, masses, or tenderness noted. Lungs:   Normal respiratory effort, chest expands symmetrically. Lungs are clear to auscultation, no crackles or wheezes. Heart:  normal rate and regular rhythm.   Extremities:  left wrist real significant edema and ecchymosis. She has tenderness on the distal radius. Normal range of motion left elbow. No hand tenderness.left foot reveals no visible swelling or ecchymosis. She has nonspecific tenderness along the proximal aspect of the fourth and fifth metatarsals Neurologic:  no focal strength deficits. Gait normal   Impression & Recommendations:  Problem # 1:  WRIST PAIN (ICD-719.43) clinically suspicious for frature.  get x-rays. Orders: T-Wrist Comp Left Min 3 Views ([id number])  Problem # 2:  FOOT PAIN (ICD-729.5)  Orders: T-Foot Left Min 3 Views ([id number])  Problem # 3:  HYPONATREMIA (ICD-276.1) discussed possible discontinuation of diuretic but patient is asymptomatic and has good oral intake and levels relatively stable. Reassess in 2 months  Problem # 4:  HYPERTENSION (ICD-401.9) Assessment: Improved  Her updated medication list for this problem includes:    Carvedilol 25 Mg Tabs (Carvedilol) ..... Once daily    Hydralazine Hcl 25 Mg Tabs (Hydralazine hcl) .Marland Kitchen..Marland Kitchen Two times a day    Lisinopril-hydrochlorothiazide 20-12.5 Mg Tabs (Lisinopril-hydrochlorothiazide) ..... One by mouth daily  Complete Medication List: 1)  Vitamin D 50000 Unit Caps (Ergocalciferol) .... One tab weekly 2)  Carvedilol 25 Mg Tabs (Carvedilol) .... Once daily 3)  Hydralazine Hcl 25 Mg Tabs (Hydralazine hcl) .... Two times a day 4)  Oxycontin 60 Mg Xr12h-tab (Oxycodone hcl) .... Three times a day 5)  Adult Aspirin Ec Low Strength 81 Mg Tbec (Aspirin) .... Once daily 6)  Lisinopril-hydrochlorothiazide 20-12.5 Mg Tabs (Lisinopril-hydrochlorothiazide) .... One by mouth daily 7)  Boniva 150 Mg Tabs (Ibandronate sodium) .... One tab monthly 8)  Citalopram Hydrobromide 20 Mg Tabs (Citalopram hydrobromide) .... One by  mouth once daily  Patient Instructions: 1)  Please schedule a follow-up appointment in 2 months.  2)  BMP prior to visit, ICD-9: 276.1

## 2010-04-11 NOTE — Progress Notes (Signed)
Summary: Pt req script for Oxycontin 60mg   Phone Note Refill Request Call back at Work Phone [phone] Message from:  dauhter in law - Dianna on November 16, 2009 4:30 PM  Refills Requested: Medication #1:  OXYCONTIN 60 MG XR12H-TAB three times a day   Dosage confirmed as above?Dosage Confirmed Pts son, [name], will be picking up script when ready.     Method Requested: Pick up at Office Initial call taken by: Braulio Bosch,  November 16, 2009 4:31 PM  Follow-up for Phone Call        Last filled 10/17/09 Nira Conn LPN  September  7, 624THL 4:36 PM will refill Follow-up by: Carolann Littler MD,  November 17, 2009 9:34 AM  Additional Follow-up for Phone Call Additional follow up Details #1::        Msg left on Diana's personally identified VM ready for pick-up Additional Follow-up by: Nira Conn LPN,  September  8, 624THL 10:09 AM    Prescriptions: OXYCONTIN 60 MG XR12H-TAB (OXYCODONE HCL) three times a day  #90 x 0   Entered and Authorized by:   Carolann Littler MD   Signed by:   Carolann Littler MD on 11/17/2009   Method used:   Print then Give to Patient   RxID:   [id number]

## 2010-04-11 NOTE — Assessment & Plan Note (Signed)
Summary: fup on pts arm/per dr/cjr   Vital Signs:  Patient profile:   77 year old female Weight:      155 pounds Temp:     98.8 degrees F BP sitting:   110 / 68  (left arm) Cuff size:   regular  Vitals Entered By: Nira Conn LPN (December  7, 624THL 11:08 AM)  History of Present Illness: Patient seen for the following  Recent skin lesion right dorsal wrist. Equivocal diagnosis. Did have some slightly atypical cells possible keratoacanthoma. No clear squamous cell carcinoma. This has healed well no signs of recurrence thus far.  Patient accompanied by daughter-in-law. Daughter-in-law has concerns that she is become progressively less active. No clear depression issues. No falls. Has been on chronic OxyContin 60 mg t.i.d. for several years and the family would like to consider scaling back. She does not show objective signs of pain or discomfort even though she states she is always in severe pain.  Family has not observed grimacing or other behaviors suggestive of pain.  Hypertension History:      She denies headache, chest pain, palpitations, dyspnea with exertion, orthopnea, PND, peripheral edema, visual symptoms, neurologic problems, syncope, and side effects from treatment.        Positive major cardiovascular risk factors include female age 41 years old or older, hyperlipidemia, and hypertension.  Negative major cardiovascular risk factors include non-tobacco-user status.     Allergies (verified): No Known Drug Allergies  Past History:  Past Medical History: Last updated: 01/27/2009 Arthritis Hypertension Hyperlipidemia Osteoporosis sick sinus syndrome ? hx cva  Past Surgical History: Last updated: 07/07/2008 Appendectomy Hysterectomy Tonsillectomy  Family History: Last updated: 01/27/2009  Family History Hypertension mother, sister Heart disease, mother, brother Diabetes, mother  Social History: Last updated: 07/07/2008 Retired Widow/Widower Never  Smoked Alcohol use-no  Risk Factors: Smoking Status: never (07/07/2008) PMH-FH-SH reviewed for relevance  Review of Systems  The patient denies anorexia, fever, weight loss, chest pain, syncope, dyspnea on exertion, peripheral edema, prolonged cough, headaches, hemoptysis, abdominal pain, melena, hematochezia, and severe indigestion/heartburn.    Physical Exam  General:  Well-developed,well-nourished,in no acute distress; alert,appropriate and cooperative throughout examination Mouth:  Oral mucosa and oropharynx without lesions or exudates.  Teeth in good repair. Neck:  No deformities, masses, or tenderness noted. Lungs:  Normal respiratory effort, chest expands symmetrically. Lungs are clear to auscultation, no crackles or wheezes. Heart:  normal rate and regular rhythm.   Skin:  right wrist dorsally reveals very small eschar from previous site of excision of lesion. No recurrence. Cervical Nodes:  No lymphadenopathy noted Psych:  normally interactive, good eye contact, not anxious appearing, and not depressed appearing.     Impression & Recommendations:  Problem # 1:  NEOPLASM OF UNCERTAIN BEHAVIOR OF SKIN (ICD-238.2) follow closely for signs of recurrence.  No clear cancer from bx.  ?early keratoacanthoma.  Problem # 2:  HYPERTENSION (ICD-401.9)  Her updated medication list for this problem includes:    Carvedilol 12.5 Mg Tabs (Carvedilol) ..... One by mouth two times a day    Hydralazine Hcl 25 Mg Tabs (Hydralazine hcl) .Marland Kitchen..Marland Kitchen Two times a day    Lisinopril-hydrochlorothiazide 10-12.5 Mg Tabs (Lisinopril-hydrochlorothiazide) ..... Once daily  Problem # 3:  CHRONIC PAIN SYNDROME (ICD-338.4) we discussed trying to scale back her pain medications to OxyContin 40 mg t.i.d. Reassess in one to 2 months and if stable that point consider dosing to b.i.d.  I feel we need to taper back at her age, esp  if she can tolerate lower doses.  Complete Medication List: 1)  Vitamin D 50000 Unit  Caps (Ergocalciferol) .... One tab weekly 2)  Carvedilol 12.5 Mg Tabs (Carvedilol) .... One by mouth two times a day 3)  Hydralazine Hcl 25 Mg Tabs (Hydralazine hcl) .... Two times a day 4)  Oxycontin 40 Mg Xr12h-tab (Oxycodone hcl) .... One by mouth three times a day 5)  Adult Aspirin Ec Low Strength 81 Mg Tbec (Aspirin) .... Once daily 6)  Boniva 150 Mg Tabs (Ibandronate sodium) .... One tab monthly 7)  Calcium Gluconate 500 Mg Tabs (Calcium gluconate) .... Once daily 8)  Centrum Silver Ultra Womens Tabs (Multiple vitamins-minerals) .... Once daily 9)  Haloperidol 1 Mg Tabs (Haloperidol) .... One by mouth at bedtime 10)  Lisinopril-hydrochlorothiazide 10-12.5 Mg Tabs (Lisinopril-hydrochlorothiazide) .... Once daily 11)  Citalopram Hydrobromide 20 Mg Tabs (Citalopram hydrobromide) .... Once daily  Hypertension Assessment/Plan:      The patient's hypertensive risk group is category B: At least one risk factor (excluding diabetes) with no target organ damage.  Today's blood pressure is 110/68.    Patient Instructions: 1)  Please schedule a follow-up appointment in 2 months.  Prescriptions: CITALOPRAM HYDROBROMIDE 20 MG TABS (CITALOPRAM HYDROBROMIDE) once daily  #90 x 3   Entered by:   Nira Conn LPN   Authorized by:   Carolann Littler MD   Signed by:   Nira Conn LPN on [id number]   Method used:   Electronically to        [facility]. [phone]* (retail)       [address]       [place], [place]  [address]       Ph: [phone]       Fax: [phone]   RxID:   [id number]    Orders Added: 1)  Est. Patient Level IV [id number]

## 2010-04-11 NOTE — Progress Notes (Signed)
Summary: REQ FOR REFILL (OXYCONTIN)  Phone Note Call from Patient   Caller: Daughter-N-Law Shauna Hugh)  [phone] Reason for Call: Refill Medication Summary of Call: Pts dghtr-n-law (Diane) called to adv that pt needs a refill on med: OXYCONTIN 60 MG XR12H-TAB (OXYCODONE HCL)...... She can be reached at [phone] when same is ready for p/u.  Initial call taken by: Duanne Moron,  May 12, 2009 9:25 AM  Follow-up for Phone Call        Last filled on 04/14/09, #90 Nira Conn LPN  March  3, 624THL [id number] AM  Will refill Follow-up by: Carolann Littler MD,  May 12, 2009 10:08 AM  Additional Follow-up for Phone Call Additional follow up Details #1::        Message left on [phone] as requested, Rx ready for pick-up Additional Follow-up by: Nira Conn LPN,  March  3, 624THL 10:13 AM    Prescriptions: OXYCONTIN 60 MG XR12H-TAB (OXYCODONE HCL) three times a day  #90 x 0   Entered and Authorized by:   Carolann Littler MD   Signed by:   Carolann Littler MD on 05/12/2009   Method used:   Print then Give to Patient   RxID:   [id number]

## 2010-04-11 NOTE — Progress Notes (Signed)
Summary: Chg of pain med request, to discuss in private  Phone Note Call from Patient Call back at Home Phone [phone]   Caller: Daughter in law [name] Call For: Carolann Littler MD Summary of Call: Daughter in law [name] requesting a change in pt pain medication (pt not aware of request).  Per pharmacist, Opana is in the same family of meds, quite a bit cheaper.  requesting Opana 40mg  (lower dose also) three times a day time released Initial call taken by: Nira Conn LPN,  November 23, 624THL 12:47 PM  Follow-up for Phone Call        Actually Opana is oxymorphone.  This is an opioid but not the same as oxycodone.  I would rec follow up in 2 weeks and we can diiscuss then.  That would give [place] a chance to evaluate her wound then as well. Follow-up by: Carolann Littler MD,  February 01, 2010 1:26 PM  Additional Follow-up for Phone Call Additional follow up Details #1::        Daughter in law informed.  She wants to discuss med chg with Dr Elease Hashimoto in person, not with pt in the room at F/U visit in 2 weeks.  FYI Additional Follow-up by: Nira Conn LPN,  November 23, 624THL 2:12 PM    Additional Follow-up for Phone Call Additional follow up Details #2::    noted. Follow-up by: Carolann Littler MD,  February 01, 2010 4:44 PM

## 2010-04-13 NOTE — Assessment & Plan Note (Signed)
Summary: f/u to review sleep hygeine and go over options//ALP   Vital Signs:  Patient profile:   77 year old female Weight:      152 pounds Temp:     98.2 degrees F oral BP sitting:   120 / 74  (left arm) Cuff size:   regular  Vitals Entered By: Cay Schillings LPN (January 18, [id number] 10:06 AM) CC: c/o insomnia Is Patient Diabetic? No   History of Present Illness: Insomnia for several weeks.  Difficulty falling asleep and waking up. Goes to bed around 9PM.  Frequently does not feel sleepy when goes to bed.   Watches TV in bed-at night and day according to daughter. No naps.  Some Dr Malachi Bonds at night-caffeine free.  No ETOH. Freq wakes at 4AM.  Gets up 8-9 AM.  No exercise. Daughter in law concerned because she is getting up freq at night. She already takes Haldol and Oxycontin for pain.  Denies any depression.  Chronic pain but she states controlled fairly  well at present.  Daughter in law gave her one Ativan 0.5 mg and that seemed to help.  Allergies (verified): No Known Drug Allergies  Past History:  Past Medical History: Last updated: 01/27/2009 Arthritis Hypertension Hyperlipidemia Osteoporosis sick sinus syndrome ? hx cva  Social History: Last updated: 07/07/2008 Retired Widow/Widower Never Smoked Alcohol use-no PMH reviewed for relevance, SH/Risk Factors reviewed for relevance  Review of Systems  The patient denies anorexia, fever, weight loss, chest pain, syncope, dyspnea on exertion, prolonged cough, headaches, abdominal pain, melena, hematochezia, severe indigestion/heartburn, incontinence, and depression.    Physical Exam  General:  Well-developed,well-nourished,in no acute distress; alert,appropriate and cooperative throughout examination Head:  normocephalic and atraumatic.   Eyes:  pupils equal, pupils round, and pupils reactive to light.   Mouth:  Oral mucosa and oropharynx without lesions or exudates.  Teeth in good repair. Neck:  No  deformities, masses, or tenderness noted. Lungs:  Normal respiratory effort, chest expands symmetrically. Lungs are clear to auscultation, no crackles or wheezes. Heart:  normal rate and regular rhythm.   Neurologic:  alert & oriented X3, cranial nerves II-XII intact, and strength normal in all extremities.   Psych:  Oriented X3, normally interactive, good eye contact, not depressed appearing, and slightly anxious.     Impression & Recommendations:  Problem # 1:  INSOMNIA (ICD-780.52) Assessment Deteriorated sleep hygiene discussed with handout given.  I would like to avoid further sedative hypnotics with her  on Haldol and Oxycontin. She will try melatonin and if no relief Tylenol PM. NO TV in bed.  Go to bed later and regimented time to rise.  recheck one month.  Problem # 2:  CHRONIC PAIN SYNDROME (ICD-338.4) Assessment: Unchanged controlled with current meds.  Complete Medication List: 1)  Vitamin D 50000 Unit Caps (Ergocalciferol) .... One tab weekly 2)  Carvedilol 12.5 Mg Tabs (Carvedilol) .... One by mouth two times a day 3)  Hydralazine Hcl 25 Mg Tabs (Hydralazine hcl) .... Two times a day 4)  Oxycontin 40 Mg Xr12h-tab (Oxycodone hcl) .... One by mouth three times a day 5)  Adult Aspirin Ec Low Strength 81 Mg Tbec (Aspirin) .... Once daily 6)  Boniva 150 Mg Tabs (Ibandronate sodium) .... One tab monthly 7)  Calcium Gluconate 500 Mg Tabs (Calcium gluconate) .... Once daily 8)  Centrum Silver Ultra Womens Tabs (Multiple vitamins-minerals) .... Once daily 9)  Haloperidol 1 Mg Tabs (Haloperidol) .... One by mouth at bedtime 10)  Lisinopril-hydrochlorothiazide 10-12.5  Mg Tabs (Lisinopril-hydrochlorothiazide) .... Once daily 11)  Citalopram Hydrobromide 20 Mg Tabs (Citalopram hydrobromide) .... Once daily  Patient Instructions: 1)  Try over the counter melatonin 2)  No TV in bed. 3)  Go to bed later at night. 4)  Have regular wake up time in morning.   Orders Added: 1)  Est.  Patient Level IV [id number]

## 2010-04-13 NOTE — Progress Notes (Signed)
Summary: Trouble Sleeping  Phone Note Call from Patient   Caller: Daughter-IN lAW Summary of Call: Daughter In-Law called to state that Pt. has not been sleeping wants to get something called in to help her sleep. Please call [phone].  Initial call taken by: Betsy Pries,  March 22, 2010 3:28 PM  Follow-up for Phone Call        she is already on alot of medications and we need to discuss options.  We have to be careful about drug interactions. Have them set up f/u to review sleep hygeine and go over ooptions. Follow-up by: Carolann Littler MD,  March 22, 2010 6:09 PM     They are coming in for an appt. tomm. 1/18/ @ 10am

## 2010-04-13 NOTE — Cardiovascular Report (Signed)
Summary: Office Visit   Office Visit   Imported By: Sallee Provencal 02/20/2010 13:58:57  _____________________________________________________________________  External Attachment:    Type:   Image     Comment:   External Document

## 2010-04-13 NOTE — Progress Notes (Signed)
Summary: new rx oxycontin  Phone Note Refill Request Call back at Home Phone [phone] Message from:  daugh-in law-dianne  Refills Requested: Medication #1:  OXYCONTIN 40 MG XR12H-TAB one by mouth three times a day pt will be out of med on sunday  Initial call taken by: Glo Herring,  March 17, 2010 8:18 AM  Follow-up for Phone Call        will refill Follow-up by: Carolann Littler MD,  March 17, 2010 12:53 PM  Additional Follow-up for Phone Call Additional follow up Details #1::        Daughter [name] informed Additional Follow-up by: Nira Conn LPN,  January  6, [id number] 1:39 PM    Prescriptions: OXYCONTIN 40 MG XR12H-TAB (OXYCODONE HCL) one by mouth three times a day  #90 x 0   Entered and Authorized by:   Carolann Littler MD   Signed by:   Carolann Littler MD on 03/17/2010   Method used:   Print then Give to Patient   RxID:   [id number]

## 2010-04-17 ENCOUNTER — Telehealth: Payer: Self-pay | Admitting: Family Medicine

## 2010-04-17 NOTE — Telephone Encounter (Signed)
Pt needs new rx oxycontin 40 mg. Pt will be out of med today

## 2010-04-18 ENCOUNTER — Telehealth: Payer: Self-pay | Admitting: Family Medicine

## 2010-04-18 DIAGNOSIS — G894 Chronic pain syndrome: Secondary | ICD-10-CM

## 2010-04-18 MED ORDER — OXYCODONE HCL 40 MG PO TB12: 40.0000 mg | ORAL_TABLET | Freq: Three times a day (TID) | ORAL | Status: DC

## 2010-04-18 NOTE — Telephone Encounter (Signed)
Pt completely out of oxycotin. Req script. Pls call pts daughter when ready for pick.

## 2010-04-18 NOTE — Telephone Encounter (Signed)
Last filled #90, 0RF on 03/17/2010

## 2010-04-18 NOTE — Telephone Encounter (Signed)
Will refill.

## 2010-04-18 NOTE — Telephone Encounter (Signed)
Daughter in law [name] informed Rx ready for pick-up

## 2010-04-24 ENCOUNTER — Ambulatory Visit (INDEPENDENT_AMBULATORY_CARE_PROVIDER_SITE_OTHER): Payer: Self-pay | Admitting: Family Medicine

## 2010-04-24 ENCOUNTER — Encounter: Payer: Self-pay | Admitting: Family Medicine

## 2010-04-24 DIAGNOSIS — D485 Neoplasm of uncertain behavior of skin: Secondary | ICD-10-CM

## 2010-04-24 DIAGNOSIS — F22 Delusional disorders: Secondary | ICD-10-CM

## 2010-04-24 DIAGNOSIS — G894 Chronic pain syndrome: Secondary | ICD-10-CM

## 2010-04-24 DIAGNOSIS — M810 Age-related osteoporosis without current pathological fracture: Secondary | ICD-10-CM

## 2010-04-24 DIAGNOSIS — G4700 Insomnia, unspecified: Secondary | ICD-10-CM

## 2010-04-24 MED ORDER — TEMAZEPAM 15 MG PO CAPS: 15.0000 mg | ORAL_CAPSULE | Freq: Every evening | ORAL | Status: DC | PRN

## 2010-04-24 MED ORDER — OXYCODONE HCL 30 MG PO TB12: 30.0000 mg | ORAL_TABLET | Freq: Two times a day (BID) | ORAL | Status: DC

## 2010-04-24 NOTE — Progress Notes (Signed)
  Subjective:    Patient ID: Heather Jarvis, female    DOB: Nov 27, 1928, 77 y.o.   MRN: [id number]  HPI  Patient is seen today for the following several items   she is accompanied by daughter-in-law who notices that she has frequent nasal drainage. She frequently has a habit of sniffing constantly. They've tried Claritin and Benadryl with no relief. She has refused nasal sprays in the past. Has never tried Zyrtec or Allegra. No fever or chills.    History of chronic back pain and chronic pain syndrome. She came to this practice on OxyContin but family questions whether she really has much chronic pain necessitating this amount of medication. We tapered her OxyContin down currently at 40 mg twice daily and will consider tapering again at this time. She has not showed objective evidence of pain.   Chronic insomnia. They've tried melatonin and Benadryl without relief. Sleep hygiene discussed in detail last visit. No alcohol use.   No history of depression. Has used Restoril the past with good success   history of delusions. Stable with none on Haldol 1 mg.   Right neck rash which daughter-in-law states has been present for about a couple of years. Stable in size. Hydrocortisone cream without relief.   Review of Systems     Objective:   Physical Exam  patient is alert cooperative and in no distress  neck exam no thyromegaly or adenopathy Chest clear to auscultation Heart regular rhythm and rate Extremities no pitting edema  Skin exam right posterior neck 0.5 x 1 cm mostly macular to slightly elevated skin lesion which is slightly scaly and a couple of locations. On magnification she has somewhat of the elevated border in places       Assessment & Plan:   #1 chronic insomnia. Try low-dose Restoril 7.5 mg each bedtime. Sleep hygiene again discussed #2 probable perennial allergic rhinitis. Try over-the-counter Zyrtec or Allegra  #3 osteoporosis. Continue Boniva  #4 chronic pain syndrome.  Continue tapering of OxyContin to 30 mg bid. #5 skin rash right posterior neck , rule out basal cell carcinoma. Refer skin surgery Center.

## 2010-04-24 NOTE — Patient Instructions (Signed)
Try  Over-the-counter Allegra or Zyrtec one daily for allergy symptoms  Try addition of low-dose Restoril for sleep difficulties at night  Reduce Haldol to one half tablet at night

## 2010-05-01 ENCOUNTER — Ambulatory Visit: Payer: Self-pay | Admitting: Family Medicine

## 2010-05-02 ENCOUNTER — Encounter: Payer: Self-pay | Admitting: Internal Medicine

## 2010-05-02 ENCOUNTER — Encounter (INDEPENDENT_AMBULATORY_CARE_PROVIDER_SITE_OTHER): Payer: Medicare Other | Admitting: Internal Medicine

## 2010-05-02 DIAGNOSIS — I10 Essential (primary) hypertension: Secondary | ICD-10-CM

## 2010-05-02 DIAGNOSIS — I495 Sick sinus syndrome: Secondary | ICD-10-CM

## 2010-05-02 DIAGNOSIS — I4891 Unspecified atrial fibrillation: Secondary | ICD-10-CM

## 2010-05-02 DIAGNOSIS — Z950 Presence of cardiac pacemaker: Secondary | ICD-10-CM | POA: Insufficient documentation

## 2010-05-09 NOTE — Assessment & Plan Note (Signed)
Summary: device/ck/saf   Visit Type:  Follow-up Primary Provider:  Carolann Littler MD   History of Present Illness: Heather Jarvis is referred today for ongoing PPM evaluation.  She is a pleasant 77 yo woman with a h/o HTN, symptomatic bradycardia, s/p PPM.  She has a h/o dyslipidemia.    Current Medications (verified): 1)  Vitamin D 50000 Unit Caps (Ergocalciferol) .... One Tab Weekly 2)  Carvedilol 12.5 Mg Tabs (Carvedilol) .... One By Mouth Two Times A Day 3)  Hydralazine Hcl 25 Mg Tabs (Hydralazine Hcl) .... Two Times A Day 4)  Oxycontin 40 Mg Xr12h-Tab (Oxycodone Hcl) .... One By Mouth Three Times A Day 5)  Adult Aspirin Ec Low Strength 81 Mg Tbec (Aspirin) .... Once Daily 6)  Boniva 150 Mg Tabs (Ibandronate Sodium) .... One Tab Monthly 7)  Calcium Gluconate 500 Mg Tabs (Calcium Gluconate) .... Once Daily 8)  Centrum Silver Ultra Womens  Tabs (Multiple Vitamins-Minerals) .... Once Daily 9)  Haloperidol 1 Mg Tabs (Haloperidol) .... One By Mouth At Bedtime 10)  Lisinopril-Hydrochlorothiazide 20-12.5 Mg Tabs (Lisinopril-Hydrochlorothiazide) .... Once Daily 11)  Citalopram Hydrobromide 20 Mg Tabs (Citalopram Hydrobromide) .... Once Daily 12)  Temazepam 15 Mg Caps (Temazepam) .... At Bedtime 13)  Oscal 500/200 D-3 500-200 Mg-Unit Tabs (Calcium Carbonate-Vitamin D) .... Two Times A Day 14)  Vitamin D 2000 Unit Tabs (Cholecalciferol) .... Daily  Allergies (verified): No Known Drug Allergies  Past History:  Past Medical History: Last updated: 01/27/2009 Arthritis Hypertension Hyperlipidemia Osteoporosis sick sinus syndrome ? hx cva  Past Surgical History: Last updated: 07/07/2008 Appendectomy Hysterectomy Tonsillectomy  Review of Systems  The patient denies chest pain, syncope, dyspnea on exertion, and peripheral edema.    Vital Signs:  Patient profile:   77 year old female Height:      60.5 inches Weight:      151 pounds BMI:     29.11 Pulse rate:   60 / minute BP  sitting:   104 / 62  (right arm)  Vitals Entered By: Margaretmary Bayley CMA (May 02, 2010 3:08 PM)  Physical Exam  General:  Well-developed,well-nourished,in no acute distress; alert,appropriate and cooperative throughout examination Head:  normocephalic and atraumatic.   Eyes:  pupils equal, pupils round, and pupils reactive to light.   Mouth:  Oral mucosa and oropharynx without lesions or exudates.  Teeth in good repair. Neck:  No deformities, masses, or tenderness noted. Chest Wall:  She has implanted portacath L upper chest wall from prior hospitalization few years ago. Lungs:  Normal respiratory effort, chest expands symmetrically. Lungs are clear to auscultation, no crackles or wheezes. Heart:  normal rate and regular rhythm.  No murmurs. Abdomen:  Bowel sounds positive,abdomen soft and non-tender without masses, organomegaly or hernias noted. Msk:  No deformity or scoliosis noted of thoracic or lumbar spine.   Pulses:  good dorsalis pedis pulses bilaterally Extremities:  No clubbing, cyanosis, edema, or deformity noted with normal full range of motion of all joints.   Neurologic:  alert & oriented X3, cranial nerves II-XII intact, and strength normal in all extremities.     PPM Specifications Following MD:  Cristopher Peru, MD     Referring MD:  Romeo Apple, MD [facility] Vendor:  [facility] Jude     PPM Model Number:  [id number]     PPM Serial Number:  [id number] PPM DOI:  08/27/2006     PPM Implanting MD:  NOT IMPLANTED BY [place]  Lead 1    Location: RA  DOI: 08/27/2006     Model #: [id number]     Serial #: [id number]     Status: active Lead 2    Location: RV     DOI: 08/27/2006     Model #: [id number]     Serial #: [id number]     Status: active  Magnet Response Rate:  BOL 98.6 ERI 86.3  Indications:  Heather Jarvis   PPM Follow Up Pacer Dependent:  No      Episodes Coumadin:  No  Parameters Mode:  DDDR     Lower Rate Limit:  60     Upper Rate Limit:  110 Paced AV Delay:  275     Sensed AV Delay:  250 MD  Comments:  Her device is working normally.  Impression & Recommendations:  Problem # 1:  CARDIAC PACEMAKER IN SITU (ICD-V45.01) Her device is working normally. Will continue her current meds.  Problem # 2:  HYPERTENSION (ICD-401.9) Her blood pressure is well controlled. She will continue her current meds. Her updated medication list for this problem includes:    Carvedilol 12.5 Mg Tabs (Carvedilol) ..... One by mouth two times a day    Hydralazine Hcl 25 Mg Tabs (Hydralazine hcl) .Marland Kitchen..Marland Kitchen Two times a day    Adult Aspirin Ec Low Strength 81 Mg Tbec (Aspirin) ..... Once daily    Lisinopril-hydrochlorothiazide 20-12.5 Mg Tabs (Lisinopril-hydrochlorothiazide) ..... Once daily  Problem # 3:  HYPERLIPIDEMIA (ICD-272.4)  Patient Instructions: 1)  Your physician wants you to follow-up in: 6 months with Dr Knox Saliva will receive a reminder letter in the mail two months in advance. If you don't receive a letter, please call our office to schedule the follow-up appointment. 2)  Your physician recommends that you continue on your current medications as directed. Please refer to the Current Medication list given to you today.

## 2010-05-18 NOTE — Cardiovascular Report (Signed)
Summary: Office Visit   Office Visit   Imported By: Sallee Provencal 05/12/2010 12:51:39  _____________________________________________________________________  External Attachment:    Type:   Image     Comment:   External Document

## 2010-05-21 ENCOUNTER — Other Ambulatory Visit: Payer: Self-pay | Admitting: Family Medicine

## 2010-05-22 ENCOUNTER — Telehealth: Payer: Self-pay | Admitting: Family Medicine

## 2010-05-22 DIAGNOSIS — G894 Chronic pain syndrome: Secondary | ICD-10-CM

## 2010-05-22 NOTE — Telephone Encounter (Signed)
Last filled 04/24/10

## 2010-05-22 NOTE — Telephone Encounter (Signed)
Please advise 

## 2010-05-22 NOTE — Telephone Encounter (Signed)
Refill Oxycontin. Call daughter when ready.

## 2010-05-22 NOTE — Telephone Encounter (Signed)
OK to refill for 6 months 

## 2010-05-23 MED ORDER — OXYCODONE HCL 30 MG PO TB12: 30.0000 mg | ORAL_TABLET | Freq: Two times a day (BID) | ORAL | Status: DC

## 2010-05-23 NOTE — Telephone Encounter (Signed)
Will refill.

## 2010-05-29 LAB — POCT I-STAT, CHEM 8
Chloride: 101 mEq/L (ref 96–112)
HCT: 39 % (ref 36.0–46.0)
Potassium: 3.7 mEq/L (ref 3.5–5.1)
Sodium: 135 mEq/L (ref 135–145)

## 2010-06-22 ENCOUNTER — Telehealth: Payer: Self-pay | Admitting: Family Medicine

## 2010-06-22 DIAGNOSIS — G894 Chronic pain syndrome: Secondary | ICD-10-CM

## 2010-06-22 MED ORDER — OXYCODONE HCL 30 MG PO TB12: 30.0000 mg | ORAL_TABLET | Freq: Two times a day (BID) | ORAL | Status: DC

## 2010-06-22 NOTE — Telephone Encounter (Signed)
Daughter informed Rx ready for pick-up

## 2010-06-22 NOTE — Telephone Encounter (Signed)
OK to refill

## 2010-06-22 NOTE — Telephone Encounter (Signed)
Pt needs new rx oxycodone 30mg 

## 2010-07-03 ENCOUNTER — Ambulatory Visit (INDEPENDENT_AMBULATORY_CARE_PROVIDER_SITE_OTHER): Payer: Medicare Other | Admitting: Family Medicine

## 2010-07-03 ENCOUNTER — Encounter: Payer: Self-pay | Admitting: Family Medicine

## 2010-07-03 VITALS — BP 98/78 | Temp 98.0°F | Wt 147.0 lb

## 2010-07-03 DIAGNOSIS — R4182 Altered mental status, unspecified: Secondary | ICD-10-CM

## 2010-07-03 DIAGNOSIS — G894 Chronic pain syndrome: Secondary | ICD-10-CM

## 2010-07-03 DIAGNOSIS — F341 Dysthymic disorder: Secondary | ICD-10-CM

## 2010-07-03 LAB — BASIC METABOLIC PANEL
CO2: 29 mEq/L (ref 19–32)
Chloride: 93 mEq/L — ABNORMAL LOW (ref 96–112)
Potassium: 4.7 mEq/L (ref 3.5–5.1)
Sodium: 135 mEq/L (ref 135–145)

## 2010-07-03 NOTE — Progress Notes (Signed)
  Subjective:    Patient ID: Heather Jarvis, female    DOB: 1928/07/18, 77 y.o.   MRN: [id number]  HPI Patient is seen accompanied by daughter-in-law with concerns of mental status changes. More gradual than acute. Short-term memory deficits but long term intact. No known history of dementia. Daughter and all has noticed that she is having more struggles with activities of daily living such as self hygiene and toileting. She is still able to dress and feed herself without difficulty. She's had some chronic sleep difficulties which are unchanged.  She does have history of depression but no recent crying spells. No appetite or weight changes. Her chronic problems include history of osteoporosis, chronic insomnia, hypertension, hyperlipidemia, history of depression, cardiac pacemaker and chronic pain syndrome. We have been slowly tapering back her OxyContin. She had moved here fairly high dosage and we've been able to taper down  No recent head injury. No fever or chills. Rare complaints of headache. No focal weakness. No speech difficulty.   Review of Systems  Constitutional: Negative for fever, chills, activity change, appetite change and unexpected weight change.  HENT: Negative for sore throat, trouble swallowing and voice change.   Eyes: Negative for visual disturbance.  Respiratory: Negative for cough, shortness of breath and wheezing.   Cardiovascular: Negative for chest pain, palpitations and leg swelling.  Gastrointestinal: Negative for nausea, vomiting, abdominal pain, diarrhea and blood in stool.  Genitourinary: Negative for dysuria.  Musculoskeletal: Positive for back pain. Negative for joint swelling.  Neurological: Negative for dizziness, seizures, syncope and facial asymmetry.  Hematological: Negative for adenopathy.  Psychiatric/Behavioral: Positive for confusion. Negative for agitation.       Objective:   Physical Exam  Constitutional: She is oriented to person, place, and time.  She appears well-developed and well-nourished. No distress.  Neck: Neck supple. No thyromegaly present.  Cardiovascular: Normal rate, regular rhythm and normal heart sounds.  Exam reveals no gallop.   Pulmonary/Chest: Effort normal and breath sounds normal. No respiratory distress. She has no wheezes. She has no rales.  Musculoskeletal: She exhibits no edema.  Lymphadenopathy:    She has no cervical adenopathy.  Neurological: She is alert and oriented to person, place, and time. She displays normal reflexes. No cranial nerve deficit. She exhibits normal muscle tone. Coordination normal.       Mini-Mental Status exam is 23/30. No prior comparison  Skin: No rash noted.  Psychiatric: She has a normal mood and affect. Her behavior is normal.          Assessment & Plan:  #1 mental status changes. More short-term memory deficit. Gradual changes. Suspect probably some early dementia. Recent thyroid functions okay. Repeat electrolytes. Check B12. CT of the head without contrast. Consider Aricept if all above normal #2 chronic pain syndrome. Try continued tapering of OxyContin.  We will plan to reduce to 20 mg twice daily at next refill. Daughter-in-law has not seen any objective evidence that she is in increased pain since we have tapered her medicines down

## 2010-07-04 NOTE — Progress Notes (Signed)
Quick Note:  Daughter informed ______

## 2010-07-07 ENCOUNTER — Ambulatory Visit (INDEPENDENT_AMBULATORY_CARE_PROVIDER_SITE_OTHER)
Admission: RE | Admit: 2010-07-07 | Discharge: 2010-07-07 | Disposition: A | Discharge status: Home / Self Care | Payer: Medicare Other | Source: Ambulatory Visit | Attending: Family Medicine | Admitting: Family Medicine

## 2010-07-07 DIAGNOSIS — R4182 Altered mental status, unspecified: Secondary | ICD-10-CM

## 2010-07-12 NOTE — Progress Notes (Signed)
patient  Is aware.  Spoke with daughter in law [name]

## 2010-07-24 ENCOUNTER — Telehealth: Payer: Self-pay | Admitting: Family Medicine

## 2010-07-24 MED ORDER — OXYCODONE HCL 20 MG PO TB12: 20.0000 mg | ORAL_TABLET | Freq: Two times a day (BID) | ORAL | Status: DC

## 2010-07-24 NOTE — Telephone Encounter (Signed)
OK to refill if this has been one month

## 2010-07-24 NOTE — Telephone Encounter (Signed)
Pt needs new rx oxycodone 20mg  . Pt is out of med

## 2010-07-24 NOTE — Telephone Encounter (Signed)
Last filled on 4/12 #60 with 0 refills.  Rx printed, signed, pt informed

## 2010-07-24 NOTE — Telephone Encounter (Signed)
Pt called to check on status of refill. Pls call.

## 2010-07-31 ENCOUNTER — Ambulatory Visit (INDEPENDENT_AMBULATORY_CARE_PROVIDER_SITE_OTHER): Payer: Medicare Other | Admitting: Family Medicine

## 2010-07-31 ENCOUNTER — Encounter: Payer: Self-pay | Admitting: Family Medicine

## 2010-07-31 DIAGNOSIS — F09 Unspecified mental disorder due to known physiological condition: Secondary | ICD-10-CM

## 2010-07-31 MED ORDER — DONEPEZIL HCL 5 MG PO TABS: 5.0000 mg | ORAL_TABLET | Freq: Every evening | ORAL | Status: DC | PRN

## 2010-07-31 NOTE — Progress Notes (Signed)
  Subjective:    Patient ID: Heather Jarvis, female    DOB: 1928/04/18, 77 y.o.   MRN: [id number]  HPI Patient seen for followup cognitive changes. Refer to prior note. Patient scored 23/30 Mini-Mental status exam. CT of head unremarkable. B12 normal. Previous thyroid function normal. Electrolytes unremarkable. No acute changes since last visit. Has been seen by dermatologist for multiple skin lesions had several cancers which were removed and are healing well. Patient denies depressive symptoms   Review of Systems  Constitutional: Negative for fever and chills.  Respiratory: Negative for cough and shortness of breath.   Cardiovascular: Negative for chest pain, palpitations and leg swelling.  Gastrointestinal: Negative for abdominal pain.  Neurological: Negative for dizziness, seizures, syncope, weakness and headaches.  Hematological: Negative for adenopathy. Does not bruise/bleed easily.  Psychiatric/Behavioral: Negative for agitation.       Objective:   Physical Exam  Constitutional: She is oriented to person, place, and time. She appears well-developed and well-nourished.  HENT:  Right Ear: External ear normal.  Left Ear: External ear normal.  Mouth/Throat: Oropharynx is clear and moist. No oropharyngeal exudate.  Cardiovascular: Normal rate, regular rhythm and normal heart sounds.   Pulmonary/Chest: Effort normal and breath sounds normal. No respiratory distress. She has no rales.  Musculoskeletal: She exhibits no edema.  Lymphadenopathy:    She has no cervical adenopathy.  Neurological: She is alert and oriented to person, place, and time.  Psychiatric: She has a normal mood and affect.          Assessment & Plan:  The patient has history of cognitive decline. Suspect early dementia. Initiate Aricept 5 mg daily. Reviewed potential side effects. Reassess one month.  Will titrate to 10 mg then as tolerated

## 2010-08-16 ENCOUNTER — Other Ambulatory Visit: Payer: Self-pay | Admitting: Family Medicine

## 2010-08-23 ENCOUNTER — Other Ambulatory Visit: Payer: Self-pay | Admitting: Family Medicine

## 2010-08-23 MED ORDER — OXYCODONE HCL 20 MG PO TB12: 20.0000 mg | ORAL_TABLET | Freq: Two times a day (BID) | ORAL | Status: DC

## 2010-08-23 NOTE — Telephone Encounter (Signed)
Ok to refill 

## 2010-08-23 NOTE — Telephone Encounter (Signed)
Rx printed, signed, pt informed ready for pick-up

## 2010-08-23 NOTE — Telephone Encounter (Signed)
Refill Oxycodone 

## 2010-08-23 NOTE — Telephone Encounter (Signed)
Last filled #60 with 0 refills on 5/14

## 2010-09-07 ENCOUNTER — Ambulatory Visit: Payer: Medicare Other | Admitting: Family Medicine

## 2010-09-15 ENCOUNTER — Other Ambulatory Visit: Payer: Self-pay | Admitting: Family Medicine

## 2010-09-21 ENCOUNTER — Telehealth: Payer: Self-pay | Admitting: Family Medicine

## 2010-09-21 NOTE — Telephone Encounter (Signed)
Last refill done on 08/23/10.

## 2010-09-21 NOTE — Telephone Encounter (Signed)
Refill Oxycodone 20mg .

## 2010-09-21 NOTE — Telephone Encounter (Signed)
OK to refill

## 2010-09-22 MED ORDER — OXYCODONE HCL 20 MG PO TB12: 20.0000 mg | ORAL_TABLET | Freq: Two times a day (BID) | ORAL | Status: DC

## 2010-09-22 NOTE — Telephone Encounter (Signed)
Daughter informed Rx ready for pick-up

## 2010-09-27 ENCOUNTER — Other Ambulatory Visit: Payer: Self-pay | Admitting: Family Medicine

## 2010-09-28 ENCOUNTER — Encounter: Payer: Self-pay | Admitting: Internal Medicine

## 2010-10-18 ENCOUNTER — Other Ambulatory Visit: Payer: Self-pay | Admitting: *Deleted

## 2010-10-18 MED ORDER — DONEPEZIL HCL 5 MG PO TABS: 5.0000 mg | ORAL_TABLET | Freq: Every evening | ORAL | Status: DC | PRN

## 2010-10-23 ENCOUNTER — Telehealth: Payer: Self-pay | Admitting: Family Medicine

## 2010-10-23 NOTE — Telephone Encounter (Signed)
Ok to refill 

## 2010-10-23 NOTE — Telephone Encounter (Signed)
Last filled #60 on 7/13

## 2010-10-23 NOTE — Telephone Encounter (Signed)
Pt needs refill of oxyCODONE (OXYCONTIN) 20 MG 12 hr tablet

## 2010-10-24 MED ORDER — OXYCODONE HCL 20 MG PO TB12: 20.0000 mg | ORAL_TABLET | Freq: Two times a day (BID) | ORAL | Status: DC

## 2010-10-24 NOTE — Telephone Encounter (Signed)
Daughter [name] informed on cell VM ready to pick-up

## 2010-11-01 ENCOUNTER — Ambulatory Visit (INDEPENDENT_AMBULATORY_CARE_PROVIDER_SITE_OTHER): Payer: Medicare Other | Admitting: Internal Medicine

## 2010-11-01 ENCOUNTER — Encounter: Payer: Self-pay | Admitting: Internal Medicine

## 2010-11-01 DIAGNOSIS — I498 Other specified cardiac arrhythmias: Secondary | ICD-10-CM

## 2010-11-01 DIAGNOSIS — I10 Essential (primary) hypertension: Secondary | ICD-10-CM

## 2010-11-01 DIAGNOSIS — Z950 Presence of cardiac pacemaker: Secondary | ICD-10-CM

## 2010-11-01 NOTE — Patient Instructions (Signed)
Your physician wants you to follow-up in: 6 months with Zacarias Pontes for a device check & 1 year with Dr. Lovena Le. You will receive a reminder letter in the mail two months in advance. If you don't receive a letter, please call our office to schedule the follow-up appointment.  Your physician recommends that you continue on your current medications as directed. Please refer to the Current Medication list given to you today.

## 2010-11-01 NOTE — Progress Notes (Signed)
HPI Mrs. Fontanez returns today for followup. She is a pleasant 77 year old woman with a history of hypertension and symptomatic bradycardia. She is status post permanent pacemaker insertion. She has dementia. The patient denies chest pain or shortness of breath. She has difficulty sleeping which has been a chronic problem. She denies peripheral edema. No syncope. No Known Allergies   Current Outpatient Prescriptions  Medication Sig Dispense Refill  . aspirin 81 MG tablet Take 81 mg by mouth daily.        . calcium-vitamin D (OSCAL WITH D) 500-200 MG-UNIT per tablet Take 1 tablet by mouth 2 (two) times daily.        . carvedilol (COREG) 12.5 MG tablet take 1 tablet by mouth twice a day  60 tablet  11  . Cholecalciferol (VITAMIN D) 2000 UNITS CAPS Take by mouth daily.        . citalopram (CELEXA) 20 MG tablet Take 20 mg by mouth daily.        . ergocalciferol (VITAMIN D2) 50000 UNITS capsule Take 50,000 Units by mouth once a week.        . haloperidol (HALDOL) 1 MG tablet One half tab At bedtime      . hydrALAZINE (APRESOLINE) 25 MG tablet take 1 tablet by mouth twice a day  180 tablet  3  . ibandronate (BONIVA) 150 MG tablet Take 150 mg by mouth every 30 (thirty) days. Take in the morning with a full glass of water, on an empty stomach, and do not take anything else by mouth or lie down for the next 30 min.       Marland Kitchen lisinopril-hydrochlorothiazide (PRINZIDE,ZESTORETIC) 20-12.5 MG per tablet take 1 tablet by mouth once daily  90 tablet  2  . Multiple Vitamins-Minerals (CENTRUM SILVER PO) Take by mouth daily.        Marland Kitchen oxyCODONE (OXYCONTIN) 20 MG 12 hr tablet Take 1 tablet (20 mg total) by mouth every 12 (twelve) hours.  60 tablet  0  . temazepam (RESTORIL) 15 MG capsule take 1 capsule by mouth at bedtime if needed for sleep  30 capsule  5  . donepezil (ARICEPT) 5 MG tablet Take 1 tablet (5 mg total) by mouth at bedtime as needed.  90 tablet  2     Past Medical History  Diagnosis Date  . Arthritis     . Hyperlipidemia   . Hypertension   . Osteoporosis   . Sick sinus syndrome     ROS:   All systems reviewed and negative except as noted in the HPI.   Past Surgical History  Procedure Date  . Appendectomy   . Vesicovaginal fistula closure w/ tah   . Tonsillectomy      Family History  Problem Relation Age of Onset  . Hypertension Mother   . Heart disease Mother   . Diabetes Mother   . Hypertension Sister   . Heart disease Brother      History   Social History  . Marital Status: Widowed    Spouse Name: N/A    Number of Children: N/A  . Years of Education: N/A   Occupational History  . Not on file.   Social History Main Topics  . Smoking status: Never Smoker   . Smokeless tobacco: Former Systems developer    Quit date: 04/24/1969  . Alcohol Use: No  . Drug Use: No  . Sexually Active: Not on file   Other Topics Concern  . Not on file   Social  History Narrative  . No narrative on file     BP 108/64  Pulse 64  Ht 5' (1.524 m)  Wt 144 lb (65.318 kg)  BMI 28.12 kg/m2  Physical Exam:  Well appearing elderly woman, NAD HEENT: Unremarkable Neck:  No JVD, no thyromegally Lymphatics:  No adenopathy Back:  No CVA tenderness Lungs:  Clear. Well-healed pacemaker incision. HEART:  Regular rate rhythm, no murmurs, no rubs, no clicks Abd:  soft, positive bowel sounds, no organomegally, no rebound, no guarding Ext:  2 plus pulses, no edema, no cyanosis, no clubbing Skin:  No rashes no nodules Neuro:  CN II through XII intact, motor grossly intact, mask facies  DEVICE  Normal device function.  See PaceArt for details.   Assess/Plan:

## 2010-11-01 NOTE — Assessment & Plan Note (Signed)
Her device is working normally. She is approaching ERI which appears to be premature. She will be follow up in 6 months for pacemaker check.

## 2010-11-01 NOTE — Assessment & Plan Note (Signed)
Her blood pressure is well controlled. She will continue her current medical therapy and maintain a low-sodium diet. 

## 2010-11-05 ENCOUNTER — Other Ambulatory Visit: Payer: Self-pay | Admitting: Family Medicine

## 2010-11-17 ENCOUNTER — Other Ambulatory Visit: Payer: Self-pay | Admitting: Family Medicine

## 2010-11-17 NOTE — Telephone Encounter (Signed)
OK to refill once. Needs office follow up in one to two months.

## 2010-11-17 NOTE — Telephone Encounter (Signed)
Last seen 07/31/10  Last written 05/21/10 #30 5Rf Please advise

## 2010-11-20 NOTE — Telephone Encounter (Signed)
Last filled on 05/21/10, #30 with 5 refills

## 2010-11-22 ENCOUNTER — Other Ambulatory Visit: Payer: Self-pay | Admitting: Family Medicine

## 2010-11-22 NOTE — Telephone Encounter (Signed)
Pt needs new rx oxycontin 20 mg.

## 2010-11-22 NOTE — Telephone Encounter (Signed)
Last filled on 10/24/10, #60 with 0 refills

## 2010-11-23 NOTE — Telephone Encounter (Signed)
Refill for one month 

## 2010-11-24 MED ORDER — OXYCODONE HCL 20 MG PO TB12: 20.0000 mg | ORAL_TABLET | Freq: Two times a day (BID) | ORAL | Status: DC

## 2010-12-21 ENCOUNTER — Other Ambulatory Visit: Payer: Self-pay | Admitting: Family Medicine

## 2010-12-21 NOTE — Telephone Encounter (Signed)
Pt need new rx oxycodone 20mg .

## 2010-12-21 NOTE — Telephone Encounter (Signed)
OK to refill

## 2010-12-22 MED ORDER — OXYCODONE HCL 20 MG PO TB12: 20.0000 mg | ORAL_TABLET | Freq: Two times a day (BID) | ORAL | Status: DC

## 2010-12-22 NOTE — Telephone Encounter (Signed)
[name] informed on VM ready to pick-up

## 2011-01-17 ENCOUNTER — Ambulatory Visit (INDEPENDENT_AMBULATORY_CARE_PROVIDER_SITE_OTHER): Payer: Medicare Other | Admitting: Family Medicine

## 2011-01-17 ENCOUNTER — Encounter: Payer: Self-pay | Admitting: Family Medicine

## 2011-01-17 VITALS — BP 100/58 | Temp 98.4°F | Wt 132.0 lb

## 2011-01-17 DIAGNOSIS — S01309A Unspecified open wound of unspecified ear, initial encounter: Secondary | ICD-10-CM

## 2011-01-17 DIAGNOSIS — R634 Abnormal weight loss: Secondary | ICD-10-CM

## 2011-01-17 DIAGNOSIS — Z23 Encounter for immunization: Secondary | ICD-10-CM

## 2011-01-17 DIAGNOSIS — F341 Dysthymic disorder: Secondary | ICD-10-CM

## 2011-01-17 DIAGNOSIS — G894 Chronic pain syndrome: Secondary | ICD-10-CM

## 2011-01-17 LAB — CBC WITH DIFFERENTIAL/PLATELET
Basophils Relative: 0.2 % (ref 0.0–3.0)
Eosinophils Relative: 1.1 % (ref 0.0–5.0)
Hemoglobin: 13.3 g/dL (ref 12.0–15.0)
Lymphocytes Relative: 29.7 % (ref 12.0–46.0)
Monocytes Relative: 10.8 % (ref 3.0–12.0)
Neutrophils Relative %: 58.2 % (ref 43.0–77.0)
RBC: 3.87 Mil/uL (ref 3.87–5.11)
WBC: 7.5 10*3/uL (ref 4.5–10.5)

## 2011-01-17 LAB — BASIC METABOLIC PANEL
Calcium: 10 mg/dL (ref 8.4–10.5)
Creatinine, Ser: 1.5 mg/dL — ABNORMAL HIGH (ref 0.4–1.2)
Sodium: 137 mEq/L (ref 135–145)

## 2011-01-17 LAB — POCT URINALYSIS DIPSTICK
Bilirubin, UA: NEGATIVE
Glucose, UA: NEGATIVE
Ketones, UA: NEGATIVE
Leukocytes, UA: NEGATIVE
Protein, UA: NEGATIVE

## 2011-01-17 LAB — HEPATIC FUNCTION PANEL
ALT: 16 U/L (ref 0–35)
AST: 21 U/L (ref 0–37)
Albumin: 3.9 g/dL (ref 3.5–5.2)
Total Bilirubin: 0.7 mg/dL (ref 0.3–1.2)

## 2011-01-17 MED ORDER — DONEPEZIL HCL 10 MG PO TABS: 10.0000 mg | ORAL_TABLET | Freq: Every evening | ORAL | Status: DC | PRN

## 2011-01-17 MED ORDER — OXYCODONE HCL 20 MG PO TB12: 20.0000 mg | ORAL_TABLET | Freq: Two times a day (BID) | ORAL | Status: DC

## 2011-01-17 NOTE — Progress Notes (Signed)
Subjective:    Patient ID: Heather Jarvis, female    DOB: 03/24/1928, 77 y.o.   MRN: [id number]  HPI  Multiple medical problems. She has history of skin cancer, hyperlipidemia, delusional disorder, depression, chronic pain syndrome, hypertension, osteoporosis and chronic insomnia. She is seen today accompanied by daughter-in-law with several issues as follows  Nonhealing wound right external ear. Has followup with dermatologist in a couple of weeks. She has had some weight loss of about 15 pounds since last May. Daughter-in-law all has encouraged her to eat frequently. She has history of depression which they think is stable. She's had some progressive confusion though she's done fairly well mental status exams previously. Currently Aricept 5 mg daily. No nausea or other side effect. She has some fatigue and low motivation to get out of bed each day and has to be encouraged to get out of bed. She states her pain is fairly well-controlled. She remains on chronic OxyContin 20 mg twice daily.  Denies any headaches, swallowing difficulties, chest pains, dyspnea, abdominal pain, or dysuria. Has occasional urine urgency. No stool changes.  Past Medical History  Diagnosis Date  . Arthritis   . Hyperlipidemia   . Hypertension   . Osteoporosis   . Sick sinus syndrome    Past Surgical History  Procedure Date  . Appendectomy   . Vesicovaginal fistula closure w/ tah   . Tonsillectomy     reports that she has never smoked. She quit smokeless tobacco use about 41 years ago. She reports that she does not drink alcohol or use illicit drugs. family history includes Diabetes in her mother; Heart disease in her brother and mother; and Hypertension in her mother and sister. No Known Allergies    Review of Systems  Constitutional: Positive for appetite change and unexpected weight change. Negative for fever and chills.  HENT: Negative for sore throat, trouble swallowing and voice change.   Eyes: Negative  for visual disturbance.  Respiratory: Negative for cough and shortness of breath.   Cardiovascular: Negative for chest pain.  Gastrointestinal: Negative for nausea, vomiting, abdominal pain, diarrhea, constipation and blood in stool.  Genitourinary: Negative for hematuria.  Neurological: Negative for dizziness and headaches.  Hematological: Negative for adenopathy.       Objective:   Physical Exam  Constitutional: She is oriented to person, place, and time. She appears well-developed and well-nourished.  HENT:  Right Ear: External ear normal.  Left Ear: External ear normal.  Mouth/Throat: Oropharynx is clear and moist.  Neck: Neck supple. No thyromegaly present.  Cardiovascular: Normal rate, regular rhythm and normal heart sounds.   Pulmonary/Chest: Effort normal and breath sounds normal. No respiratory distress. She has no wheezes. She has no rales.  Musculoskeletal: She exhibits no edema.  Lymphadenopathy:    She has no cervical adenopathy.  Neurological: She is alert and oriented to person, place, and time.  Skin:       Small scabbed ulcer about 57mm R auricle.  Psychiatric: She has a normal mood and affect. Her behavior is normal.          Assessment & Plan:  #1 weight loss. Difficult to sort out how much of this may be mood related, dementia related, versus other. Start with some screening labs. #2 dementia. Increase Aricept 10 mg daily  #3 chronic pain syndrome. Refill OxyContin 20 mg twice a day #60 with no refill #4 nonhealing wound right external ear  concerning for possible squamous cell cancer. Followup with dermatologist in 2 weeks  as scheduled #5 health maintenance. Flu vaccine given

## 2011-01-18 NOTE — Progress Notes (Signed)
Quick Note:  Pt daughter informed ______

## 2011-01-29 ENCOUNTER — Other Ambulatory Visit: Payer: Self-pay | Admitting: Family Medicine

## 2011-01-31 NOTE — Telephone Encounter (Signed)
Last filled #30 with 0 refills on 12/21/10

## 2011-01-31 NOTE — Telephone Encounter (Signed)
Refill OK

## 2011-02-19 ENCOUNTER — Other Ambulatory Visit: Payer: Self-pay | Admitting: Family Medicine

## 2011-02-19 MED ORDER — OXYCODONE HCL 20 MG PO TB12: 20.0000 mg | ORAL_TABLET | Freq: Two times a day (BID) | ORAL | Status: DC

## 2011-02-19 NOTE — Telephone Encounter (Signed)
Refill Oxycodone. Thanks. °

## 2011-02-19 NOTE — Telephone Encounter (Signed)
Son informed Rx ready to pick-up

## 2011-02-19 NOTE — Telephone Encounter (Signed)
Last filled 01-17-11, #60 with 0 refills

## 2011-02-19 NOTE — Telephone Encounter (Signed)
Refill OK

## 2011-03-08 ENCOUNTER — Other Ambulatory Visit: Payer: Self-pay | Admitting: Family Medicine

## 2011-03-12 ENCOUNTER — Other Ambulatory Visit: Payer: Self-pay | Admitting: Family Medicine

## 2011-03-21 ENCOUNTER — Telehealth: Payer: Self-pay | Admitting: Family Medicine

## 2011-03-21 NOTE — Telephone Encounter (Signed)
Refill okay?  

## 2011-03-21 NOTE — Telephone Encounter (Signed)
Last filled 02-19-11 #60 with 0 refills

## 2011-03-21 NOTE — Telephone Encounter (Signed)
Requesting refill on oxyCODONE (OXYCONTIN) 20 MG 12 hr tablet

## 2011-03-22 MED ORDER — OXYCODONE HCL 20 MG PO TB12: 20.0000 mg | ORAL_TABLET | Freq: Two times a day (BID) | ORAL | Status: DC

## 2011-03-22 NOTE — Telephone Encounter (Signed)
Daughter informed ready

## 2011-04-01 ENCOUNTER — Encounter (HOSPITAL_COMMUNITY): Payer: Self-pay

## 2011-04-01 ENCOUNTER — Inpatient Hospital Stay (HOSPITAL_COMMUNITY)
Admission: EM | Admit: 2011-04-01 | Discharge: 2011-04-06 | DRG: 389 | Disposition: A | Discharge status: Home / Self Care | Payer: Medicare Other | Source: Ambulatory Visit | Attending: Internal Medicine | Admitting: Internal Medicine

## 2011-04-01 ENCOUNTER — Emergency Department (HOSPITAL_COMMUNITY): Payer: Medicare Other

## 2011-04-01 ENCOUNTER — Other Ambulatory Visit: Payer: Self-pay

## 2011-04-01 DIAGNOSIS — M810 Age-related osteoporosis without current pathological fracture: Secondary | ICD-10-CM

## 2011-04-01 DIAGNOSIS — E876 Hypokalemia: Secondary | ICD-10-CM | POA: Diagnosis not present

## 2011-04-01 DIAGNOSIS — D72829 Elevated white blood cell count, unspecified: Secondary | ICD-10-CM

## 2011-04-01 DIAGNOSIS — F341 Dysthymic disorder: Secondary | ICD-10-CM | POA: Diagnosis not present

## 2011-04-01 DIAGNOSIS — Z7982 Long term (current) use of aspirin: Secondary | ICD-10-CM

## 2011-04-01 DIAGNOSIS — R5381 Other malaise: Secondary | ICD-10-CM

## 2011-04-01 DIAGNOSIS — R112 Nausea with vomiting, unspecified: Secondary | ICD-10-CM | POA: Diagnosis not present

## 2011-04-01 DIAGNOSIS — F22 Delusional disorders: Secondary | ICD-10-CM

## 2011-04-01 DIAGNOSIS — F0390 Unspecified dementia without behavioral disturbance: Secondary | ICD-10-CM

## 2011-04-01 DIAGNOSIS — D7289 Other specified disorders of white blood cells: Secondary | ICD-10-CM | POA: Diagnosis not present

## 2011-04-01 DIAGNOSIS — F411 Generalized anxiety disorder: Secondary | ICD-10-CM | POA: Diagnosis present

## 2011-04-01 DIAGNOSIS — I77810 Thoracic aortic ectasia: Secondary | ICD-10-CM | POA: Diagnosis not present

## 2011-04-01 DIAGNOSIS — D649 Anemia, unspecified: Secondary | ICD-10-CM | POA: Diagnosis present

## 2011-04-01 DIAGNOSIS — D485 Neoplasm of uncertain behavior of skin: Secondary | ICD-10-CM

## 2011-04-01 DIAGNOSIS — F3289 Other specified depressive episodes: Secondary | ICD-10-CM | POA: Diagnosis present

## 2011-04-01 DIAGNOSIS — R3915 Urgency of urination: Secondary | ICD-10-CM

## 2011-04-01 DIAGNOSIS — R55 Syncope and collapse: Secondary | ICD-10-CM

## 2011-04-01 DIAGNOSIS — I951 Orthostatic hypotension: Secondary | ICD-10-CM | POA: Diagnosis not present

## 2011-04-01 DIAGNOSIS — N179 Acute kidney failure, unspecified: Secondary | ICD-10-CM | POA: Diagnosis not present

## 2011-04-01 DIAGNOSIS — E871 Hypo-osmolality and hyponatremia: Secondary | ICD-10-CM

## 2011-04-01 DIAGNOSIS — K5649 Other impaction of intestine: Secondary | ICD-10-CM | POA: Diagnosis not present

## 2011-04-01 DIAGNOSIS — R197 Diarrhea, unspecified: Secondary | ICD-10-CM | POA: Diagnosis not present

## 2011-04-01 DIAGNOSIS — R109 Unspecified abdominal pain: Secondary | ICD-10-CM

## 2011-04-01 DIAGNOSIS — G4700 Insomnia, unspecified: Secondary | ICD-10-CM | POA: Diagnosis present

## 2011-04-01 DIAGNOSIS — Z950 Presence of cardiac pacemaker: Secondary | ICD-10-CM | POA: Diagnosis not present

## 2011-04-01 DIAGNOSIS — S62109A Fracture of unspecified carpal bone, unspecified wrist, initial encounter for closed fracture: Secondary | ICD-10-CM

## 2011-04-01 DIAGNOSIS — F329 Major depressive disorder, single episode, unspecified: Secondary | ICD-10-CM | POA: Diagnosis present

## 2011-04-01 DIAGNOSIS — E785 Hyperlipidemia, unspecified: Secondary | ICD-10-CM | POA: Diagnosis not present

## 2011-04-01 DIAGNOSIS — T4275XA Adverse effect of unspecified antiepileptic and sedative-hypnotic drugs, initial encounter: Secondary | ICD-10-CM | POA: Diagnosis present

## 2011-04-01 DIAGNOSIS — G894 Chronic pain syndrome: Secondary | ICD-10-CM

## 2011-04-01 DIAGNOSIS — G8929 Other chronic pain: Secondary | ICD-10-CM | POA: Diagnosis present

## 2011-04-01 DIAGNOSIS — R5383 Other fatigue: Secondary | ICD-10-CM

## 2011-04-01 DIAGNOSIS — I10 Essential (primary) hypertension: Secondary | ICD-10-CM | POA: Diagnosis not present

## 2011-04-01 DIAGNOSIS — R1084 Generalized abdominal pain: Secondary | ICD-10-CM | POA: Diagnosis not present

## 2011-04-01 DIAGNOSIS — K5641 Fecal impaction: Secondary | ICD-10-CM | POA: Diagnosis not present

## 2011-04-01 LAB — COMPREHENSIVE METABOLIC PANEL
ALT: 29 U/L (ref 0–35)
AST: 35 U/L (ref 0–37)
Albumin: 3.7 g/dL (ref 3.5–5.2)
Alkaline Phosphatase: 163 U/L — ABNORMAL HIGH (ref 39–117)
Calcium: 9.6 mg/dL (ref 8.4–10.5)
Potassium: 4.4 mEq/L (ref 3.5–5.1)
Sodium: 138 mEq/L (ref 135–145)
Total Protein: 7 g/dL (ref 6.0–8.3)

## 2011-04-01 LAB — URINALYSIS, ROUTINE W REFLEX MICROSCOPIC
Glucose, UA: NEGATIVE mg/dL
Specific Gravity, Urine: 1.015 (ref 1.005–1.030)

## 2011-04-01 LAB — URINE MICROSCOPIC-ADD ON

## 2011-04-01 LAB — CBC
Hemoglobin: 12.4 g/dL (ref 12.0–15.0)
MCH: 34 pg (ref 26.0–34.0)
MCV: 100 fL (ref 78.0–100.0)
MCV: 98.9 fL (ref 78.0–100.0)
Platelets: 209 10*3/uL (ref 150–400)
RBC: 4.18 MIL/uL (ref 3.87–5.11)
RBC: 3.65 MIL/uL — ABNORMAL LOW (ref 3.87–5.11)
WBC: 14.4 10*3/uL — ABNORMAL HIGH (ref 4.0–10.5)

## 2011-04-01 LAB — POCT I-STAT, CHEM 8
BUN: 36 mg/dL — ABNORMAL HIGH (ref 6–23)
Creatinine, Ser: 1.8 mg/dL — ABNORMAL HIGH (ref 0.50–1.10)
Glucose, Bld: 140 mg/dL — ABNORMAL HIGH (ref 70–99)
Hemoglobin: 15 g/dL (ref 12.0–15.0)
Potassium: 4.4 mEq/L (ref 3.5–5.1)
Sodium: 140 mEq/L (ref 135–145)

## 2011-04-01 LAB — TYPE AND SCREEN
ABO/RH(D): O POS
Antibody Screen: NEGATIVE

## 2011-04-01 LAB — DIFFERENTIAL
Eosinophils Relative: 1 % (ref 0–5)
Lymphocytes Relative: 15 % (ref 12–46)
Lymphs Abs: 2.1 10*3/uL (ref 0.7–4.0)
Neutro Abs: 10.8 10*3/uL — ABNORMAL HIGH (ref 1.7–7.7)
Neutrophils Relative %: 75 % (ref 43–77)

## 2011-04-01 LAB — HEPATIC FUNCTION PANEL
AST: 56 U/L — ABNORMAL HIGH (ref 0–37)
Albumin: 3.3 g/dL — ABNORMAL LOW (ref 3.5–5.2)
Bilirubin, Direct: <0.1 mg/dL (ref 0.0–0.3)

## 2011-04-01 LAB — CARDIAC PANEL(CRET KIN+CKTOT+MB+TROPI)
Relative Index: INVALID (ref 0.0–2.5)
Total CK: 90 U/L (ref 7–177)
Troponin I: <0.3 ng/mL (ref <0.30)

## 2011-04-01 LAB — OCCULT BLOOD, POC DEVICE: Fecal Occult Bld: NEGATIVE

## 2011-04-01 MED ORDER — HALOPERIDOL 1 MG PO TABS
1.0000 mg | ORAL_TABLET | Freq: Every day | ORAL | Status: DC
  Administered 2011-04-01 – 2011-04-05 (×5): 1 mg via ORAL
  Filled 2011-04-01 (×6): qty 1

## 2011-04-01 MED ORDER — DOCUSATE SODIUM 100 MG PO CAPS
100.0000 mg | ORAL_CAPSULE | Freq: Two times a day (BID) | ORAL | Status: DC
  Administered 2011-04-02: 100 mg via ORAL
  Filled 2011-04-01 (×5): qty 1

## 2011-04-01 MED ORDER — POLYETHYLENE GLYCOL 3350 17 G PO PACK
17.0000 g | PACK | Freq: Every day | ORAL | Status: DC
  Filled 2011-04-01 (×3): qty 1

## 2011-04-01 MED ORDER — HYDRALAZINE HCL 25 MG PO TABS
25.0000 mg | ORAL_TABLET | Freq: Three times a day (TID) | ORAL | Status: DC
  Administered 2011-04-01 – 2011-04-03 (×6): 25 mg via ORAL
  Filled 2011-04-01 (×8): qty 1

## 2011-04-01 MED ORDER — ACETAMINOPHEN 650 MG RE SUPP: 650.0000 mg | Freq: Four times a day (QID) | RECTAL | Status: DC | PRN

## 2011-04-01 MED ORDER — ALUM & MAG HYDROXIDE-SIMETH 200-200-20 MG/5ML PO SUSP: 30.0000 mL | Freq: Four times a day (QID) | ORAL | Status: DC | PRN

## 2011-04-01 MED ORDER — TEMAZEPAM 7.5 MG PO CAPS: 15.0000 mg | ORAL_CAPSULE | Freq: Every evening | ORAL | Status: DC | PRN

## 2011-04-01 MED ORDER — FLEET ENEMA 7-19 GM/118ML RE ENEM
1.0000 | ENEMA | Freq: Once | RECTAL | Status: AC
  Administered 2011-04-01: 1 via RECTAL
  Filled 2011-04-01: qty 1

## 2011-04-01 MED ORDER — SODIUM CHLORIDE 0.9 % IV BOLUS (SEPSIS)
1000.0000 mL | Freq: Once | INTRAVENOUS | Status: AC
  Administered 2011-04-01: 1000 mL via INTRAVENOUS

## 2011-04-01 MED ORDER — ONDANSETRON HCL 4 MG/2ML IJ SOLN: 4.0000 mg | Freq: Four times a day (QID) | INTRAMUSCULAR | Status: DC | PRN

## 2011-04-01 MED ORDER — OXYCODONE HCL 5 MG PO TABS
5.0000 mg | ORAL_TABLET | ORAL | Status: DC | PRN
  Administered 2011-04-05 – 2011-04-06 (×2): 5 mg via ORAL
  Filled 2011-04-01 (×2): qty 1

## 2011-04-01 MED ORDER — ASPIRIN 81 MG PO TABS: 81.0000 mg | ORAL_TABLET | Freq: Every day | ORAL | Status: DC

## 2011-04-01 MED ORDER — CALCIUM CARBONATE-VITAMIN D 500-200 MG-UNIT PO TABS
1.0000 | ORAL_TABLET | Freq: Two times a day (BID) | ORAL | Status: DC
  Administered 2011-04-01 – 2011-04-06 (×10): 1 via ORAL
  Filled 2011-04-01 (×11): qty 1

## 2011-04-01 MED ORDER — SODIUM CHLORIDE 0.9 % IV SOLN
INTRAVENOUS | Status: DC
  Administered 2011-04-01: 100 mL via INTRAVENOUS

## 2011-04-01 MED ORDER — ADULT MULTIVITAMIN W/MINERALS CH
1.0000 | ORAL_TABLET | Freq: Every day | ORAL | Status: DC
  Administered 2011-04-02 – 2011-04-06 (×5): 1 via ORAL
  Filled 2011-04-01 (×5): qty 1

## 2011-04-01 MED ORDER — CITALOPRAM HYDROBROMIDE 20 MG PO TABS
20.0000 mg | ORAL_TABLET | Freq: Every day | ORAL | Status: DC
  Administered 2011-04-02 – 2011-04-06 (×5): 20 mg via ORAL
  Filled 2011-04-01 (×5): qty 1

## 2011-04-01 MED ORDER — SODIUM CHLORIDE 0.9 % IV SOLN
INTRAVENOUS | Status: DC
  Administered 2011-04-01: 75 mL via INTRAVENOUS
  Administered 2011-04-02: 1000 mL via INTRAVENOUS
  Administered 2011-04-02 – 2011-04-03 (×2): via INTRAVENOUS

## 2011-04-01 MED ORDER — CARVEDILOL 12.5 MG PO TABS
12.5000 mg | ORAL_TABLET | Freq: Two times a day (BID) | ORAL | Status: DC
  Administered 2011-04-01 – 2011-04-06 (×10): 12.5 mg via ORAL
  Filled 2011-04-01 (×12): qty 1

## 2011-04-01 MED ORDER — ASPIRIN EC 81 MG PO TBEC
81.0000 mg | DELAYED_RELEASE_TABLET | Freq: Every day | ORAL | Status: DC
  Administered 2011-04-02 – 2011-04-06 (×5): 81 mg via ORAL
  Filled 2011-04-01 (×5): qty 1

## 2011-04-01 MED ORDER — ENOXAPARIN SODIUM 40 MG/0.4ML ~~LOC~~ SOLN
40.0000 mg | SUBCUTANEOUS | Status: DC
  Administered 2011-04-01 – 2011-04-02 (×2): 40 mg via SUBCUTANEOUS
  Filled 2011-04-01 (×3): qty 0.4

## 2011-04-01 MED ORDER — MORPHINE SULFATE 2 MG/ML IJ SOLN
1.0000 mg | INTRAMUSCULAR | Status: DC | PRN
  Administered 2011-04-01 – 2011-04-02 (×3): 1 mg via INTRAVENOUS
  Filled 2011-04-01 (×3): qty 1

## 2011-04-01 MED ORDER — VITAMIN D3 25 MCG (1000 UNIT) PO TABS
1000.0000 [IU] | ORAL_TABLET | Freq: Every day | ORAL | Status: DC
  Administered 2011-04-02 – 2011-04-06 (×5): 1000 [IU] via ORAL
  Filled 2011-04-01 (×5): qty 1

## 2011-04-01 MED ORDER — ONDANSETRON HCL 4 MG PO TABS: 4.0000 mg | ORAL_TABLET | Freq: Four times a day (QID) | ORAL | Status: DC | PRN

## 2011-04-01 MED ORDER — ACETAMINOPHEN 325 MG PO TABS
650.0000 mg | ORAL_TABLET | Freq: Four times a day (QID) | ORAL | Status: DC | PRN
  Administered 2011-04-01 – 2011-04-06 (×4): 650 mg via ORAL
  Filled 2011-04-01 (×4): qty 2

## 2011-04-01 MED ORDER — ONDANSETRON HCL 4 MG/2ML IJ SOLN: 4.0000 mg | Freq: Three times a day (TID) | INTRAMUSCULAR | Status: DC | PRN

## 2011-04-01 NOTE — ED Notes (Signed)
Pt presents to ed by ems for weakness while walking with son. On arrival pt continously sts she needs to use the restroom. No output when attempts to have bowelmovement, hypotensive after attempt. Belly tender with palpation. Hr normal.

## 2011-04-01 NOTE — ED Notes (Signed)
Pt here by ems for general weakness, was walking with son and got weak, went to restroom and then when son went back to check on her 10 mins alter pt was pale diaphoretic and dizzy and weak. Did vomit x 1 with initial medic on scene. Orthostatic on scene.

## 2011-04-01 NOTE — ED Notes (Signed)
Family at bedside.SON

## 2011-04-01 NOTE — ED Provider Notes (Signed)
History     CSN: [id number]  Arrival date & time 04/01/11  1112   First MD Initiated Contact with Patient 04/01/11 1115      Chief Complaint  Patient presents with  . Weakness  . Diarrhea  . Nausea    (Consider location/radiation/quality/duration/timing/severity/associated sxs/prior treatment) HPI Comments: -year-old female brought by EMS with generalized weakness and syncopal episode at home. She got pale and diaphoretic upon getting up from the toilet as long as well lightheaded. She had nausea and vomiting times one at the scene. Complaining of lower abdominal pain low back pain. She is profoundly hypotensive on arrival to 50/30. Heart rate is 60. Her stool is guaiac-negative, there is a firm stool ball in her proximal rectum  The history is provided by the patient and the EMS personnel.    Past Medical History  Diagnosis Date  . Arthritis   . Hyperlipidemia   . Hypertension   . Osteoporosis   . Sick sinus syndrome     Past Surgical History  Procedure Date  . Appendectomy   . Vesicovaginal fistula closure w/ tah   . Tonsillectomy   . Abdominal hysterectomy     Family History  Problem Relation Age of Onset  . Hypertension Mother   . Heart disease Mother   . Diabetes Mother   . Hypertension Sister   . Heart disease Brother     History  Substance Use Topics  . Smoking status: Never Smoker   . Smokeless tobacco: Former Systems developer    Quit date: 04/24/1969  . Alcohol Use: No    OB History    Grav Para Term Preterm Abortions TAB SAB Ect Mult Living                  Review of Systems  Unable to perform ROS: Unstable vital signs  Gastrointestinal: Positive for diarrhea.  Neurological: Positive for weakness.    Allergies  Review of patient's allergies indicates no known allergies.  Home Medications   No current outpatient prescriptions on file.  BP 149/65  Pulse 75  Temp(Src) 99.1 F (37.3 C) (Oral)  Resp 16  Ht 5' (1.524 m)  Wt 136 lb 14.5 oz (62.1  kg)  BMI 26.74 kg/m2  SpO2 93%  Physical Exam  Constitutional: She is oriented to person, place, and time. She appears well-developed and well-nourished. No distress.       Pale  HENT:  Head: Normocephalic and atraumatic.  Eyes: Conjunctivae are normal. Pupils are equal, round, and reactive to light.  Neck: Normal range of motion. Neck supple.  Cardiovascular: Normal rate, regular rhythm and normal heart sounds.   Pulmonary/Chest: Effort normal and breath sounds normal. No respiratory distress.  Abdominal: Soft. She exhibits distension. There is tenderness. There is guarding.       Firm midline abdominal mass over well-healed scar. Nonpulsatile  Genitourinary: Guaiac negative stool.       Firm stool in proximal rectum I cannot reach  Musculoskeletal: Normal range of motion. She exhibits no edema and no tenderness.  Neurological: She is alert and oriented to person, place, and time. No cranial nerve deficit.  Skin: Skin is warm.    ED Course  Procedures (including critical care time)  Labs Reviewed  CBC - Abnormal; Notable for the following:    WBC 14.4 (*)    All other components within normal limits  DIFFERENTIAL - Abnormal; Notable for the following:    Neutro Abs 10.8 (*)    Monocytes Absolute  1.3 (*)    All other components within normal limits  COMPREHENSIVE METABOLIC PANEL - Abnormal; Notable for the following:    Glucose, Bld 147 (*)    BUN 34 (*)    Creatinine, Ser 1.63 (*)    Alkaline Phosphatase 163 (*)    GFR calc non Af Amer 29 (*)    GFR calc Af Amer 33 (*)    All other components within normal limits  LACTIC ACID, PLASMA - Abnormal; Notable for the following:    Lactic Acid, Venous 3.3 (*)    All other components within normal limits  POCT I-STAT, CHEM 8 - Abnormal; Notable for the following:    BUN 36 (*)    Creatinine, Ser 1.80 (*)    Glucose, Bld 140 (*)    All other components within normal limits  CBC - Abnormal; Notable for the following:    WBC  18.9 (*)    RBC 3.65 (*)    All other components within normal limits  TYPE AND SCREEN  LIPASE, BLOOD  OCCULT BLOOD, POC DEVICE  CARDIAC PANEL(CRET KIN+CKTOT+MB+TROPI)  PROTIME-INR  ABO/RH  URINALYSIS, ROUTINE W REFLEX MICROSCOPIC  I-STAT, CHEM 8  HEPATIC FUNCTION PANEL  TSH  LIPID PANEL   Ct Abdomen Pelvis Wo Contrast  04/01/2011  *RADIOLOGY REPORT*  Clinical Data:  Hypotension, diaphoresis, palpable abdominal mass, concern for ruptured AAA.  CT CHEST, ABDOMEN AND PELVIS WITHOUT CONTRAST  Technique:  Multidetector CT imaging of the chest, abdomen and pelvis was performed following the standard protocol without IV contrast.  Comparison:  None  CT CHEST  Findings:  Mild dependent atelectasis / scarring in the bilateral lower lobes.  Lungs otherwise essentially clear.  No suspicious pulmonary nodules. No pleural effusion or pneumothorax.  Visualized thyroid is unremarkable.  The heart is normal in size.  No pericardial effusion.  Left subclavian pacemaker.  Coronary atherosclerosis.  Atherosclerotic calcifications of the aortic arch.  Mild ectasia of the ascending aortic arch measuring up to 3.7 cm.  No evidence of thoracic aortic aneurysm.  No suspicious mediastinal or axillary lymphadenopathy.  Degenerative changes of the thoracic spine.  Moderate superior endplate compression deformity at T12, age indeterminate.  IMPRESSION: No evidence of acute cardiopulmonary disease.  Ectasia of the ascending aortic arch up to 3.7 cm.  No evidence of thoracic aortic aneurysm.  Moderate superior endplate compression deformity at T12, age indeterminate.  Per CMS PQRS reporting requirements (PQRS Measure 24): Given the patient's age of greater than 20 and the fracture site (spine), the patient should be tested for osteoporosis using DXA, and the appropriate treatment considered based on the DXA results.  CT ABDOMEN AND PELVIS  Findings:  Left Bochdalek hernia.  Unenhanced liver, spleen, pancreas, and right adrenal  gland are within normal limits.  1.8 x 1.7 cm left adrenal adenoma.  Status post cholecystectomy.  No intrahepatic ductal dilatation.  Bilateral kidneys are notable for multiple hyperdense lesions, including: --2.3 x 2.6 cm exophytic left upper pole lesion (series 2/image 48) --1.3 x 1.3 cm exophytic left lower pole lesion (series 2/image 63) --1.5 x 1.4 cm right medial interpolar lesion (series 2/image 60)  While these (and other lesions) may reflect hemorrhagic cysts, there are strictly indeterminate on unenhanced CT. No hydronephrosis.  No evidence of bowel obstruction.  Large volume fecal impaction within the distal sigmoid/rectum, likely accounting for the palpable abnormality.  Atherosclerotic calcifications of the abdominal aorta and branch vessels.  No evidence of abdominal aortic aneurysm.  No abdominopelvic ascites.  No suspicious abdominopelvic lymphadenopathy.  The uterus is not discretely visualized and is likely surgically absent.  No adnexal masses.  Bladder is decompressed and anteriorly displaced.  Deformity from prior midline pelvic fractures.  IMPRESSION: Large volume fecal impaction within the distal sigmoid/rectum, likely accounting for the palpable abnormality.  No evidence of abdominal aortic aneurysm.  Multiple hyperdense renal lesions, as described above, possibly hemorrhagic cysts but strictly indeterminate on unenhanced CT.  If clinically warranted, consider unenhanced MRI abdomen for definitive characterization on a non emergent basis.  Original Report Authenticated By: Julian Hy, M.D.   Ct Chest Wo Contrast  04/01/2011  *RADIOLOGY REPORT*  Clinical Data:  Hypotension, diaphoresis, palpable abdominal mass, concern for ruptured AAA.  CT CHEST, ABDOMEN AND PELVIS WITHOUT CONTRAST  Technique:  Multidetector CT imaging of the chest, abdomen and pelvis was performed following the standard protocol without IV contrast.  Comparison:  None  CT CHEST  Findings:  Mild dependent  atelectasis / scarring in the bilateral lower lobes.  Lungs otherwise essentially clear.  No suspicious pulmonary nodules. No pleural effusion or pneumothorax.  Visualized thyroid is unremarkable.  The heart is normal in size.  No pericardial effusion.  Left subclavian pacemaker.  Coronary atherosclerosis.  Atherosclerotic calcifications of the aortic arch.  Mild ectasia of the ascending aortic arch measuring up to 3.7 cm.  No evidence of thoracic aortic aneurysm.  No suspicious mediastinal or axillary lymphadenopathy.  Degenerative changes of the thoracic spine.  Moderate superior endplate compression deformity at T12, age indeterminate.  IMPRESSION: No evidence of acute cardiopulmonary disease.  Ectasia of the ascending aortic arch up to 3.7 cm.  No evidence of thoracic aortic aneurysm.  Moderate superior endplate compression deformity at T12, age indeterminate.  Per CMS PQRS reporting requirements (PQRS Measure 24): Given the patient's age of greater than 31 and the fracture site (spine), the patient should be tested for osteoporosis using DXA, and the appropriate treatment considered based on the DXA results.  CT ABDOMEN AND PELVIS  Findings:  Left Bochdalek hernia.  Unenhanced liver, spleen, pancreas, and right adrenal gland are within normal limits.  1.8 x 1.7 cm left adrenal adenoma.  Status post cholecystectomy.  No intrahepatic ductal dilatation.  Bilateral kidneys are notable for multiple hyperdense lesions, including: --2.3 x 2.6 cm exophytic left upper pole lesion (series 2/image 48) --1.3 x 1.3 cm exophytic left lower pole lesion (series 2/image 63) --1.5 x 1.4 cm right medial interpolar lesion (series 2/image 60)  While these (and other lesions) may reflect hemorrhagic cysts, there are strictly indeterminate on unenhanced CT. No hydronephrosis.  No evidence of bowel obstruction.  Large volume fecal impaction within the distal sigmoid/rectum, likely accounting for the palpable abnormality.   Atherosclerotic calcifications of the abdominal aorta and branch vessels.  No evidence of abdominal aortic aneurysm.  No abdominopelvic ascites.  No suspicious abdominopelvic lymphadenopathy.  The uterus is not discretely visualized and is likely surgically absent.  No adnexal masses.  Bladder is decompressed and anteriorly displaced.  Deformity from prior midline pelvic fractures.  IMPRESSION: Large volume fecal impaction within the distal sigmoid/rectum, likely accounting for the palpable abnormality.  No evidence of abdominal aortic aneurysm.  Multiple hyperdense renal lesions, as described above, possibly hemorrhagic cysts but strictly indeterminate on unenhanced CT.  If clinically warranted, consider unenhanced MRI abdomen for definitive characterization on a non emergent basis.  Original Report Authenticated By: Julian Hy, M.D.   Dg Chest Portable 1 View  04/01/2011  *RADIOLOGY REPORT*  Clinical Data:  Abdominal pain  PORTABLE CHEST - 1 VIEW  Comparison: CT 04/01/2011  Findings: Aorta is ectatic and unfolded.  Dual lead left-sided pacemaker noted.  Right hemidiaphragmatic eventration again noted. Mildly diffusely prominent interstitial markings are low without focal pulmonary opacity.  Mild enlargement of the cardiomediastinal silhouette is present without edema.  No pleural effusion.  No acute osseous finding.  IMPRESSION: No acute cardiopulmonary process.  Original Report Authenticated By: Arline Asp, M.D.     1. Syncope   2. Orthostatic hypotension   3. Fecal impaction       MDM  Patient's vital sign abnormalities on arrival were very concerning. In the setting of syncope, abdominal pain, nausea vomiting and hypotension the possibility of ruptured aortic aneurysm was considered. Patient with no known history of the same no previous imaging. I performed a bedside ultrasound while performeing IV resuscitation was unable to see the aorta secondary overlying structures. I contact the  vascular surgeon on call Dr. Kellie Simmering to let him know of my concerns and he recommended sending her to CT scan despite her hypotension. Her heart rate remained in the 60s and her blood pressure after a liter of fluid was in the 80s.   Status CT scan was performed without contrast and did not reveal any evidence of aortic pathology. Patient's blood pressure responded to fluids. Large fecal impaction was found in the sigmoid and rectum likely accounting for the patient's abdominal mass.  Patient given enema with successful multiple large BM in ED.  Pain and nausea did not substaintially improve.  ARF with leukocytosis on labs. Will needs admission given initial profound hypotension and orthostasis.   Date: 04/01/2011  Rate: 60  Rhythm: paced  QRS Axis: normal  Intervals: normal  ST/T Wave abnormalities: nonspecific ST/T changes  Conduction Disutrbances:none  Narrative Interpretation: Septal Q waves  Old EKG Reviewed: none available    CRITICAL CARE Performed by: Ezequiel Essex   Total critical care time: 45  Critical care time was exclusive of separately billable procedures and treating other patients.  Critical care was necessary to treat or prevent imminent or life-threatening deterioration.  Critical care was time spent personally by me on the following activities: development of treatment plan with patient and/or surrogate as well as nursing, discussions with consultants, evaluation of patient's response to treatment, examination of patient, obtaining history from patient or surrogate, ordering and performing treatments and interventions, ordering and review of laboratory studies, ordering and review of radiographic studies, pulse oximetry and re-evaluation of patient's condition.   Ezequiel Essex, MD 04/01/11 [phone]

## 2011-04-01 NOTE — H&P (Signed)
PCP:   Eulas Post, MD, MD   Chief Complaint:  Weakness, nausea/vomiting, syncope and abdominal pain; hypertension  HPI: 78 year old female with a past medical history significant for hypertension, hyperlipidemia, depression, osteoporosis and history of sick sinus syndrome status post pacemaker implantation; came to the major department secondary to generalized weakness and syncopal episode at home he did patient reports getting up from the toilet fail lightheaded and passed out. She reported having abdominal pain, nausea/vomiting X1 at the scene. Patient was found to be profoundly hypotensive on arrival to the emergency department requiring almost 2 L of fluid to stabilize her. Patient also reports having some difficulty moving her bowels lately and endorses being taken regular pain medications. Patient denies any chest pain, palpitations, sweating, fever, hematemesis/hematochezia or any other acute complaints. In the ED a CT scan of the abdomen demonstrated significant fecal impaction in her colon Ascending up to her sigmoid sigmoid area. Patient received an enema treatment and had a significant bowel movement in the ED. Laboratory workup suggest hypokalemia, acute renal failure and also WBC's sales up to 14,000. Patient was also found to be positive for orthostatic changes.  Allergies:  No Known Allergies    Past Medical History  Diagnosis Date  . Arthritis   . Hyperlipidemia   . Hypertension   . Osteoporosis   . Sick sinus syndrome     Past Surgical History  Procedure Date  . Appendectomy   . Vesicovaginal fistula closure w/ tah   . Tonsillectomy     Prior to Admission medications   Medication Sig Start Date End Date Taking? Authorizing Provider  aspirin 81 MG tablet Take 81 mg by mouth daily.    Yes Historical Provider, MD  calcium-vitamin D (OSCAL WITH D) 500-200 MG-UNIT per tablet Take 1 tablet by mouth 2 (two) times daily.    Yes Historical Provider, MD  carvedilol  (COREG) 12.5 MG tablet Take 12.5 mg by mouth 2 (two) times daily with a meal.   Yes Historical Provider, MD  cholecalciferol (VITAMIN D) 1000 UNITS tablet Take 1,000 Units by mouth daily.   Yes Historical Provider, MD  citalopram (CELEXA) 20 MG tablet Take 20 mg by mouth daily.   Yes Historical Provider, MD  donepezil (ARICEPT) 10 MG tablet Take 10 mg by mouth at bedtime as needed. For memory 01/17/11 01/17/12 Yes Eulas Post, MD  ergocalciferol (VITAMIN D2) 50000 UNITS capsule Take 50,000 Units by mouth once a week. On Fridays   Yes Historical Provider, MD  haloperidol (HALDOL) 1 MG tablet Take 1 mg by mouth at bedtime.   Yes Historical Provider, MD  hydrALAZINE (APRESOLINE) 25 MG tablet Take 25 mg by mouth 2 (two) times daily.   Yes Historical Provider, MD  ibandronate (BONIVA) 150 MG tablet Take 150 mg by mouth every 30 (thirty) days. Take in the morning with a full glass of water, on an empty stomach, and do not take anything else by mouth or lie down for the next 30 min. (take on the 6th day of each month)   Yes Historical Provider, MD  lisinopril-hydrochlorothiazide (PRINZIDE,ZESTORETIC) 20-12.5 MG per tablet Take 1 tablet by mouth daily.   Yes Historical Provider, MD  Multiple Vitamin (MULITIVITAMIN WITH MINERALS) TABS Take 1 tablet by mouth daily. Centrum silver   Yes Historical Provider, MD  oxyCODONE (OXYCONTIN) 20 MG 12 hr tablet Take 20 mg by mouth every 12 (twelve) hours. 03/22/11  Yes Eulas Post, MD  temazepam (RESTORIL) 15 MG capsule Take 15  mg by mouth at bedtime as needed. For sleep   Yes Historical Provider, MD    Social History:  reports that she has never smoked. She quit smokeless tobacco use about 41 years ago. She reports that she does not drink alcohol or use illicit drugs.  Family History  Problem Relation Age of Onset  . Hypertension Mother   . Heart disease Mother   . Diabetes Mother   . Hypertension Sister   . Heart disease Brother     Review of  Systems:  Negative except as mention on HPI.  Physical Exam: Blood pressure 149/65, pulse 75, temperature 99.1 F (37.3 C), temperature source Oral, resp. rate 16, height 5' (1.524 m), weight 62.1 kg (136 lb 14.5 oz), SpO2 93.00%. Gen: Mild distress, cooperative to examination, dry mucous membranes. HEENT: Normocephalic and atraumatic; PERRLA, extra ocular muscles intact, no icterus; ears no bulging or erythema on tympanic membranes bilaterally; no discharges coming out of her nose; throw without erythema or exudates. Neck: Supple, no thyromegaly. Heart: Positive systolic ejection murmur, S1 and S2, no rubs or gallops. Respiratory system: Clear to auscultation bilaterally. Abdomen soft, tender to the patient with bulging mass sensation in her middle area  and left upper quadrant area; no bruits, positive bowel sounds. Extremities: No edema, no cyanosis, no clubbing. Neurologic exam: Patient was alert, awake, oriented x3, cranial nerve 2-12 grossly intact, muscle strength 4 or 5 bilaterally symmetrically; no focal neurologic deficit. Psych: appropriate  Labs on Admission:  Results for orders placed during the hospital encounter of 04/01/11 (from the past 48 hour(s))  TYPE AND SCREEN     Status: Normal   Collection Time   04/01/11 11:30 AM      Component Value Range Comment   ABO/RH(D) O POS      Antibody Screen NEG      Sample Expiration 04/04/2011     ABO/RH     Status: Normal   Collection Time   04/01/11 11:30 AM      Component Value Range Comment   ABO/RH(D) O POS     CBC     Status: Abnormal   Collection Time   04/01/11 11:35 AM      Component Value Range Comment   WBC 14.4 (*) 4.0 - 10.5 (K/uL)    RBC 4.18  3.87 - 5.11 (MIL/uL)    Hemoglobin 14.1  12.0 - 15.0 (g/dL)    HCT 41.8  36.0 - 46.0 (%)    MCV 100.0  78.0 - 100.0 (fL)    MCH 33.7  26.0 - 34.0 (pg)    MCHC 33.7  30.0 - 36.0 (g/dL)    RDW 12.6  11.5 - 15.5 (%)    Platelets 209  150 - 400 (K/uL)   DIFFERENTIAL      Status: Abnormal   Collection Time   04/01/11 11:35 AM      Component Value Range Comment   Neutrophils Relative 75  43 - 77 (%)    Neutro Abs 10.8 (*) 1.7 - 7.7 (K/uL)    Lymphocytes Relative 15  12 - 46 (%)    Lymphs Abs 2.1  0.7 - 4.0 (K/uL)    Monocytes Relative 9  3 - 12 (%)    Monocytes Absolute 1.3 (*) 0.1 - 1.0 (K/uL)    Eosinophils Relative 1  0 - 5 (%)    Eosinophils Absolute 0.1  0.0 - 0.7 (K/uL)    Basophils Relative 0  0 - 1 (%)  Basophils Absolute 0.0  0.0 - 0.1 (K/uL)   COMPREHENSIVE METABOLIC PANEL     Status: Abnormal   Collection Time   04/01/11 11:35 AM      Component Value Range Comment   Sodium 138  135 - 145 (mEq/L)    Potassium 4.4  3.5 - 5.1 (mEq/L)    Chloride 104  96 - 112 (mEq/L)    CO2 22  19 - 32 (mEq/L)    Glucose, Bld 147 (*) 70 - 99 (mg/dL)    BUN 34 (*) 6 - 23 (mg/dL)    Creatinine, Ser 1.63 (*) 0.50 - 1.10 (mg/dL)    Calcium 9.6  8.4 - 10.5 (mg/dL)    Total Protein 7.0  6.0 - 8.3 (g/dL)    Albumin 3.7  3.5 - 5.2 (g/dL)    AST 35  0 - 37 (U/L)    ALT 29  0 - 35 (U/L)    Alkaline Phosphatase 163 (*) 39 - 117 (U/L)    Total Bilirubin 0.4  0.3 - 1.2 (mg/dL)    GFR calc non Af Amer 29 (*) >90 (mL/min)    GFR calc Af Amer 33 (*) >90 (mL/min)   LIPASE, BLOOD     Status: Normal   Collection Time   04/01/11 11:35 AM      Component Value Range Comment   Lipase 45  11 - 59 (U/L)   PROTIME-INR     Status: Normal   Collection Time   04/01/11 11:35 AM      Component Value Range Comment   Prothrombin Time 14.0  11.6 - 15.2 (seconds)    INR 1.06  0.00 - 1.49    LACTIC ACID, PLASMA     Status: Abnormal   Collection Time   04/01/11 11:36 AM      Component Value Range Comment   Lactic Acid, Venous 3.3 (*) 0.5 - 2.2 (mmol/L)   OCCULT BLOOD, POC DEVICE     Status: Normal   Collection Time   04/01/11 11:40 AM      Component Value Range Comment   Fecal Occult Bld NEGATIVE     CARDIAC PANEL(CRET KIN+CKTOT+MB+TROPI)     Status: Normal   Collection Time    04/01/11 11:40 AM      Component Value Range Comment   Total CK 90  7 - 177 (U/L)    CK, MB 1.5  0.3 - 4.0 (ng/mL)    Troponin I <0.30  <0.30 (ng/mL)    Relative Index RELATIVE INDEX IS INVALID  0.0 - 2.5    POCT I-STAT, CHEM 8     Status: Abnormal   Collection Time   04/01/11 11:52 AM      Component Value Range Comment   Sodium 140  135 - 145 (mEq/L)    Potassium 4.4  3.5 - 5.1 (mEq/L)    Chloride 109  96 - 112 (mEq/L)    BUN 36 (*) 6 - 23 (mg/dL)    Creatinine, Ser 1.80 (*) 0.50 - 1.10 (mg/dL)    Glucose, Bld 140 (*) 70 - 99 (mg/dL)    Calcium, Ion 1.19  1.12 - 1.32 (mmol/L)    TCO2 23  0 - 100 (mmol/L)    Hemoglobin 15.0  12.0 - 15.0 (g/dL)    HCT 44.0  36.0 - 46.0 (%)     Radiological Exams on Admission: Ct Abdomen Pelvis Wo Contrast  04/01/2011  *RADIOLOGY REPORT*  Clinical Data:  Hypotension, diaphoresis, palpable abdominal mass, concern  for ruptured AAA.  CT CHEST, ABDOMEN AND PELVIS WITHOUT CONTRAST  Technique:  Multidetector CT imaging of the chest, abdomen and pelvis was performed following the standard protocol without IV contrast.  Comparison:  None  CT CHEST  Findings:  Mild dependent atelectasis / scarring in the bilateral lower lobes.  Lungs otherwise essentially clear.  No suspicious pulmonary nodules. No pleural effusion or pneumothorax.  Visualized thyroid is unremarkable.  The heart is normal in size.  No pericardial effusion.  Left subclavian pacemaker.  Coronary atherosclerosis.  Atherosclerotic calcifications of the aortic arch.  Mild ectasia of the ascending aortic arch measuring up to 3.7 cm.  No evidence of thoracic aortic aneurysm.  No suspicious mediastinal or axillary lymphadenopathy.  Degenerative changes of the thoracic spine.  Moderate superior endplate compression deformity at T12, age indeterminate.  IMPRESSION: No evidence of acute cardiopulmonary disease.  Ectasia of the ascending aortic arch up to 3.7 cm.  No evidence of thoracic aortic aneurysm.  Moderate  superior endplate compression deformity at T12, age indeterminate.  Per CMS PQRS reporting requirements (PQRS Measure 24): Given the patient's age of greater than 37 and the fracture site (spine), the patient should be tested for osteoporosis using DXA, and the appropriate treatment considered based on the DXA results.  CT ABDOMEN AND PELVIS  Findings:  Left Bochdalek hernia.  Unenhanced liver, spleen, pancreas, and right adrenal gland are within normal limits.  1.8 x 1.7 cm left adrenal adenoma.  Status post cholecystectomy.  No intrahepatic ductal dilatation.  Bilateral kidneys are notable for multiple hyperdense lesions, including: --2.3 x 2.6 cm exophytic left upper pole lesion (series 2/image 48) --1.3 x 1.3 cm exophytic left lower pole lesion (series 2/image 63) --1.5 x 1.4 cm right medial interpolar lesion (series 2/image 60)  While these (and other lesions) may reflect hemorrhagic cysts, there are strictly indeterminate on unenhanced CT. No hydronephrosis.  No evidence of bowel obstruction.  Large volume fecal impaction within the distal sigmoid/rectum, likely accounting for the palpable abnormality.  Atherosclerotic calcifications of the abdominal aorta and branch vessels.  No evidence of abdominal aortic aneurysm.  No abdominopelvic ascites.  No suspicious abdominopelvic lymphadenopathy.  The uterus is not discretely visualized and is likely surgically absent.  No adnexal masses.  Bladder is decompressed and anteriorly displaced.  Deformity from prior midline pelvic fractures.  IMPRESSION: Large volume fecal impaction within the distal sigmoid/rectum, likely accounting for the palpable abnormality.  No evidence of abdominal aortic aneurysm.  Multiple hyperdense renal lesions, as described above, possibly hemorrhagic cysts but strictly indeterminate on unenhanced CT.  If clinically warranted, consider unenhanced MRI abdomen for definitive characterization on a non emergent basis.  Original Report  Authenticated By: Julian Hy, M.D.   Ct Chest Wo Contrast  04/01/2011  *RADIOLOGY REPORT*  Clinical Data:  Hypotension, diaphoresis, palpable abdominal mass, concern for ruptured AAA.  CT CHEST, ABDOMEN AND PELVIS WITHOUT CONTRAST  Technique:  Multidetector CT imaging of the chest, abdomen and pelvis was performed following the standard protocol without IV contrast.  Comparison:  None  CT CHEST  Findings:  Mild dependent atelectasis / scarring in the bilateral lower lobes.  Lungs otherwise essentially clear.  No suspicious pulmonary nodules. No pleural effusion or pneumothorax.  Visualized thyroid is unremarkable.  The heart is normal in size.  No pericardial effusion.  Left subclavian pacemaker.  Coronary atherosclerosis.  Atherosclerotic calcifications of the aortic arch.  Mild ectasia of the ascending aortic arch measuring up to 3.7 cm.  No evidence  of thoracic aortic aneurysm.  No suspicious mediastinal or axillary lymphadenopathy.  Degenerative changes of the thoracic spine.  Moderate superior endplate compression deformity at T12, age indeterminate.  IMPRESSION: No evidence of acute cardiopulmonary disease.  Ectasia of the ascending aortic arch up to 3.7 cm.  No evidence of thoracic aortic aneurysm.  Moderate superior endplate compression deformity at T12, age indeterminate.  Per CMS PQRS reporting requirements (PQRS Measure 24): Given the patient's age of greater than 8 and the fracture site (spine), the patient should be tested for osteoporosis using DXA, and the appropriate treatment considered based on the DXA results.  CT ABDOMEN AND PELVIS  Findings:  Left Bochdalek hernia.  Unenhanced liver, spleen, pancreas, and right adrenal gland are within normal limits.  1.8 x 1.7 cm left adrenal adenoma.  Status post cholecystectomy.  No intrahepatic ductal dilatation.  Bilateral kidneys are notable for multiple hyperdense lesions, including: --2.3 x 2.6 cm exophytic left upper pole lesion (series  2/image 48) --1.3 x 1.3 cm exophytic left lower pole lesion (series 2/image 63) --1.5 x 1.4 cm right medial interpolar lesion (series 2/image 60)  While these (and other lesions) may reflect hemorrhagic cysts, there are strictly indeterminate on unenhanced CT. No hydronephrosis.  No evidence of bowel obstruction.  Large volume fecal impaction within the distal sigmoid/rectum, likely accounting for the palpable abnormality.  Atherosclerotic calcifications of the abdominal aorta and branch vessels.  No evidence of abdominal aortic aneurysm.  No abdominopelvic ascites.  No suspicious abdominopelvic lymphadenopathy.  The uterus is not discretely visualized and is likely surgically absent.  No adnexal masses.  Bladder is decompressed and anteriorly displaced.  Deformity from prior midline pelvic fractures.  IMPRESSION: Large volume fecal impaction within the distal sigmoid/rectum, likely accounting for the palpable abnormality.  No evidence of abdominal aortic aneurysm.  Multiple hyperdense renal lesions, as described above, possibly hemorrhagic cysts but strictly indeterminate on unenhanced CT.  If clinically warranted, consider unenhanced MRI abdomen for definitive characterization on a non emergent basis.  Original Report Authenticated By: Julian Hy, M.D.   Dg Chest Portable 1 View  04/01/2011  *RADIOLOGY REPORT*  Clinical Data: Abdominal pain  PORTABLE CHEST - 1 VIEW  Comparison: CT 04/01/2011  Findings: Aorta is ectatic and unfolded.  Dual lead left-sided pacemaker noted.  Right hemidiaphragmatic eventration again noted. Mildly diffusely prominent interstitial markings are low without focal pulmonary opacity.  Mild enlargement of the cardiomediastinal silhouette is present without edema.  No pleural effusion.  No acute osseous finding.  IMPRESSION: No acute cardiopulmonary process.  Original Report Authenticated By: Arline Asp, M.D.     Assessment/Plan 1-Fecal impaction: Most likely responsible  for patient nausea/vomiting and abdominal pain. Patient received enema in the ED which accomplish a good bowel movement. Will start MiraLAX and Colace as part of bowel regimen. Patient fecal impaction most likely secondary to the use of narcotics medication. Will check thyroid function.  2-syncope: Most likely vasovagal in etiology; also with a coadjuvant of orthostasis. Will place patient on telemetry, check 2-D echo (do to systolic normal leaflet on auscultation) and will provide fluid resuscitation. Will repeat orthostatic vital signs in a.m.  3-HYPERLIPIDEMIA: Will check lipid profile. Continue statins.  4-DEPRESSION/ANXIETY: Stable continue current medications.  5-HYPERTENSION: Will hold nephrotoxic agent (lisinopril/HCTZ); continue hydralazine and colonic for blood pressure control.  6-INSOMNIA: Continue Restoril.  7-ARF (acute renal failure): Most likely secondary to decreased by mouth intake and mild dehydration. Will provide fluid resuscitation and will follow kidney function in a.m.  8-delusional disorder: Stable, continue Haldol  9-Leukocytosis: No fever; chest x-Regas without findings of acute cardiopulmonary process; will follow urinalysis, will provide fluid resuscitation and follow WBCs trend. This will be most likely secondary to demargination.  10-DVT:lovenox.   Time Spent on Admission: 50 minutes  Eden Roc [phone]  04/01/2011, 5:09 PM

## 2011-04-01 NOTE — ED Notes (Signed)
MD at bedside. Dr Wyvonnia Dusky, and dr Kellie Simmering with vascular

## 2011-04-01 NOTE — ED Notes (Signed)
Patient transported to CT 

## 2011-04-01 NOTE — ED Notes (Signed)
Dr. Wyvonnia Dusky states will enter admit orders so pt can be transported upstairs.  Report given to Larene Beach, RN in ED until pt goes upstairs.

## 2011-04-02 ENCOUNTER — Inpatient Hospital Stay (HOSPITAL_COMMUNITY): Payer: Medicare Other

## 2011-04-02 LAB — LIPID PANEL
Cholesterol: 121 mg/dL (ref 0–200)
Total CHOL/HDL Ratio: 2.4 RATIO
Triglycerides: 56 mg/dL (ref <150)
VLDL: 11 mg/dL (ref 0–40)

## 2011-04-02 LAB — TSH: TSH: 0.881 u[IU]/mL (ref 0.350–4.500)

## 2011-04-02 MED ORDER — BIOTENE DRY MOUTH MT LIQD
15.0000 mL | Freq: Two times a day (BID) | OROMUCOSAL | Status: DC
  Administered 2011-04-02 – 2011-04-06 (×6): 15 mL via OROMUCOSAL

## 2011-04-02 MED ORDER — FLEET ENEMA 7-19 GM/118ML RE ENEM
1.0000 | ENEMA | Freq: Once | RECTAL | Status: AC
  Administered 2011-04-02: 1 via RECTAL
  Filled 2011-04-02: qty 1

## 2011-04-02 NOTE — Progress Notes (Signed)
Fleets enema given; a extra large hard bowel movement in return.  Pt has periods of confusion Heather Jarvis

## 2011-04-02 NOTE — Progress Notes (Signed)
PATIENT DETAILS Name: Heather Jarvis Age: 78 y.o. Sex: female Date of Birth: 1929/01/14 Admit Date: 04/01/2011 [id number] W, MD, MD POA:   CONSULTS: None  Subjective: Pt c/o some abdominal discomfort. Denies any n/v. No BM since admit. Son at bedside on exam  Objective: Vital signs in last 24 hours: Temp:  [97.7 F (36.5 C)-100 F (37.8 C)] 99.6 F (37.6 C) (01/21 0520) Pulse Rate:  [60-82] 73  (01/21 0520) Resp:  [16-18] 18  (01/21 0520) BP: (92-152)/(46-74) 152/71 mmHg (01/21 0520) SpO2:  [93 %-100 %] 94 % (01/21 0520) Weight:  [62.1 kg (136 lb 14.5 oz)] 62.1 kg (136 lb 14.5 oz) (01/20 1654) Weight change:  Last BM Date: 04/02/11  Intake/Output from previous day:  Intake/Output Summary (Last 24 hours) at 04/02/11 1138 Last data filed at 04/02/11 0700  Gross per 24 hour  Intake 1180.92 ml  Output      0 ml  Net 1180.92 ml     Physical Exam:  Gen:  Awake, alert in NAD Cardiovascular:  S1S2 c sem Respiratory: CTAB, no w/r/c, no increased wob Gastrointestinal: Abdomen soft, NT, BS hypoactive. No rebound or guarding Extremities: no c/c/e   Lab Results:  Lab 04/01/11 1743 04/01/11 1152 04/01/11 1135  HGB 12.4 15.0 14.1  HCT 36.1 44.0 41.8  WBC 18.9* -- 14.4*  PLT 187 -- 209     Lab 04/01/11 1152 04/01/11 1135  NA 140 138  K 4.4 4.4  CL 109 104  CO2 -- 22  GLUCOSE 140* 147*  BUN 36* 34*  CREATININE 1.80* 1.63*  CALCIUM -- 9.6  MG -- --  PHOS -- --    Studies/Results: Ct Abdomen Pelvis Wo Contrast  04/01/2011  *RADIOLOGY REPORT*  Clinical Data:  Hypotension, diaphoresis, palpable abdominal mass, concern for ruptured AAA.  CT CHEST, ABDOMEN AND PELVIS WITHOUT CONTRAST  Technique:  Multidetector CT imaging of the chest, abdomen and pelvis was performed following the standard protocol without IV contrast.  Comparison:  None  CT CHEST  Findings:  Mild dependent atelectasis / scarring in the bilateral lower lobes.  Lungs otherwise essentially clear.   No suspicious pulmonary nodules. No pleural effusion or pneumothorax.  Visualized thyroid is unremarkable.  The heart is normal in size.  No pericardial effusion.  Left subclavian pacemaker.  Coronary atherosclerosis.  Atherosclerotic calcifications of the aortic arch.  Mild ectasia of the ascending aortic arch measuring up to 3.7 cm.  No evidence of thoracic aortic aneurysm.  No suspicious mediastinal or axillary lymphadenopathy.  Degenerative changes of the thoracic spine.  Moderate superior endplate compression deformity at T12, age indeterminate.  IMPRESSION: No evidence of acute cardiopulmonary disease.  Ectasia of the ascending aortic arch up to 3.7 cm.  No evidence of thoracic aortic aneurysm.  Moderate superior endplate compression deformity at T12, age indeterminate.  Per CMS PQRS reporting requirements (PQRS Measure 24): Given the patient's age of greater than 10 and the fracture site (spine), the patient should be tested for osteoporosis using DXA, and the appropriate treatment considered based on the DXA results.  CT ABDOMEN AND PELVIS  Findings:  Left Bochdalek hernia.  Unenhanced liver, spleen, pancreas, and right adrenal gland are within normal limits.  1.8 x 1.7 cm left adrenal adenoma.  Status post cholecystectomy.  No intrahepatic ductal dilatation.  Bilateral kidneys are notable for multiple hyperdense lesions, including: --2.3 x 2.6 cm exophytic left upper pole lesion (series 2/image 48) --1.3 x 1.3 cm exophytic left lower pole lesion (series 2/image 63) --  1.5 x 1.4 cm right medial interpolar lesion (series 2/image 60)  While these (and other lesions) may reflect hemorrhagic cysts, there are strictly indeterminate on unenhanced CT. No hydronephrosis.  No evidence of bowel obstruction.  Large volume fecal impaction within the distal sigmoid/rectum, likely accounting for the palpable abnormality.  Atherosclerotic calcifications of the abdominal aorta and branch vessels.  No evidence of abdominal  aortic aneurysm.  No abdominopelvic ascites.  No suspicious abdominopelvic lymphadenopathy.  The uterus is not discretely visualized and is likely surgically absent.  No adnexal masses.  Bladder is decompressed and anteriorly displaced.  Deformity from prior midline pelvic fractures.  IMPRESSION: Large volume fecal impaction within the distal sigmoid/rectum, likely accounting for the palpable abnormality.  No evidence of abdominal aortic aneurysm.  Multiple hyperdense renal lesions, as described above, possibly hemorrhagic cysts but strictly indeterminate on unenhanced CT.  If clinically warranted, consider unenhanced MRI abdomen for definitive characterization on a non emergent basis.  Original Report Authenticated By: Julian Hy, M.D.   Abd 1 View (kub)  January 06, 202013  *RADIOLOGY REPORT*  Clinical Data: Fecal impaction  ABDOMEN - 1 VIEW  Comparison: None  Findings: Gaseous distention of the colon is identified up to the level of the rectum.  Prominent desiccated stool ball is identified within the central pelvis within the rectum.  A few air-filled loops of small bowel are noted within the right lower quadrant of the abdomen.  IMPRESSION:  1.  Findings compatible with rectal impaction with resultant gaseous distention of the proximal large bowel loops.  Original Report Authenticated By: Angelita Ingles, M.D.   Ct Chest Wo Contrast  04/01/2011  *RADIOLOGY REPORT*  Clinical Data:  Hypotension, diaphoresis, palpable abdominal mass, concern for ruptured AAA.  CT CHEST, ABDOMEN AND PELVIS WITHOUT CONTRAST  Technique:  Multidetector CT imaging of the chest, abdomen and pelvis was performed following the standard protocol without IV contrast.  Comparison:  None  CT CHEST  Findings:  Mild dependent atelectasis / scarring in the bilateral lower lobes.  Lungs otherwise essentially clear.  No suspicious pulmonary nodules. No pleural effusion or pneumothorax.  Visualized thyroid is unremarkable.  The heart is normal  in size.  No pericardial effusion.  Left subclavian pacemaker.  Coronary atherosclerosis.  Atherosclerotic calcifications of the aortic arch.  Mild ectasia of the ascending aortic arch measuring up to 3.7 cm.  No evidence of thoracic aortic aneurysm.  No suspicious mediastinal or axillary lymphadenopathy.  Degenerative changes of the thoracic spine.  Moderate superior endplate compression deformity at T12, age indeterminate.  IMPRESSION: No evidence of acute cardiopulmonary disease.  Ectasia of the ascending aortic arch up to 3.7 cm.  No evidence of thoracic aortic aneurysm.  Moderate superior endplate compression deformity at T12, age indeterminate.  Per CMS PQRS reporting requirements (PQRS Measure 24): Given the patient's age of greater than 55 and the fracture site (spine), the patient should be tested for osteoporosis using DXA, and the appropriate treatment considered based on the DXA results.  CT ABDOMEN AND PELVIS  Findings:  Left Bochdalek hernia.  Unenhanced liver, spleen, pancreas, and right adrenal gland are within normal limits.  1.8 x 1.7 cm left adrenal adenoma.  Status post cholecystectomy.  No intrahepatic ductal dilatation.  Bilateral kidneys are notable for multiple hyperdense lesions, including: --2.3 x 2.6 cm exophytic left upper pole lesion (series 2/image 48) --1.3 x 1.3 cm exophytic left lower pole lesion (series 2/image 63) --1.5 x 1.4 cm right medial interpolar lesion (series 2/image 60)  While these (and other lesions) may reflect hemorrhagic cysts, there are strictly indeterminate on unenhanced CT. No hydronephrosis.  No evidence of bowel obstruction.  Large volume fecal impaction within the distal sigmoid/rectum, likely accounting for the palpable abnormality.  Atherosclerotic calcifications of the abdominal aorta and branch vessels.  No evidence of abdominal aortic aneurysm.  No abdominopelvic ascites.  No suspicious abdominopelvic lymphadenopathy.  The uterus is not discretely  visualized and is likely surgically absent.  No adnexal masses.  Bladder is decompressed and anteriorly displaced.  Deformity from prior midline pelvic fractures.  IMPRESSION: Large volume fecal impaction within the distal sigmoid/rectum, likely accounting for the palpable abnormality.  No evidence of abdominal aortic aneurysm.  Multiple hyperdense renal lesions, as described above, possibly hemorrhagic cysts but strictly indeterminate on unenhanced CT.  If clinically warranted, consider unenhanced MRI abdomen for definitive characterization on a non emergent basis.  Original Report Authenticated By: Julian Hy, M.D.   Dg Chest Portable 1 View  04/01/2011  *RADIOLOGY REPORT*  Clinical Data: Abdominal pain  PORTABLE CHEST - 1 VIEW  Comparison: CT 04/01/2011  Findings: Aorta is ectatic and unfolded.  Dual lead left-sided pacemaker noted.  Right hemidiaphragmatic eventration again noted. Mildly diffusely prominent interstitial markings are low without focal pulmonary opacity.  Mild enlargement of the cardiomediastinal silhouette is present without edema.  No pleural effusion.  No acute osseous finding.  IMPRESSION: No acute cardiopulmonary process.  Original Report Authenticated By: Arline Asp, M.D.    Medications: Scheduled Meds:   . antiseptic oral rinse  15 mL Mouth Rinse BID  . aspirin EC  81 mg Oral Daily  . calcium-vitamin D  1 tablet Oral BID  . carvedilol  12.5 mg Oral BID WC  . cholecalciferol  1,000 Units Oral Daily  . citalopram  20 mg Oral Daily  . docusate sodium  100 mg Oral BID  . enoxaparin  40 mg Subcutaneous Q24H  . haloperidol  1 mg Oral QHS  . hydrALAZINE  25 mg Oral Q8H  . mulitivitamin with minerals  1 tablet Oral Daily  . polyethylene glycol  17 g Oral Daily  . sodium chloride  1,000 mL Intravenous Once  . sodium chloride  1,000 mL Intravenous Once  . sodium phosphate  1 enema Rectal Once  . DISCONTD: sodium chloride   Intravenous STAT  . DISCONTD: aspirin   81 mg Oral Daily   Continuous Infusions:   . sodium chloride 75 mL/hr at 04/02/11 0533   PRN Meds:.acetaminophen, acetaminophen, alum & mag hydroxide-simeth, morphine, ondansetron (ZOFRAN) IV, ondansetron, oxyCODONE, temazepam, DISCONTD: ondansetron (ZOFRAN) IV Antibiotics: Anti-infectives    None       Assessment/Plan:  1-Fecal impaction: Most likely responsible for patient nausea/vomiting and abdominal pain. Patient received enema in the ED which accomplish a good bowel movement. Will start MiraLAX and Colace as part of bowel regimen. Follow-up film this am perhaps some improvement, but still with considerable amount of stool. Will repeat fleets enema and continue bowel regimen. Patient fecal impaction most likely secondary to the use of narcotics medication. Thyroid fx wnl.   2-syncope: Most likely vasovagal in etiology; also with a coadjuvant of orthostasis. No events on telemetry, 2Decho pending (ordered due to systolic murmur on auscultation and syncope) and will provide fluid resuscitation. Order for orthostatics (none done this am).   3. Acute kidney injury: likely prerenal in nature given dehydration and with hypotension on arrival to ED. Will recheck in am. Diuretic/ACEI combo remain on hold  4.  Hypotension: resolved s/p IVF resuscitation. Will continue IVFs at 75cc/hr. Cont coreg and hydralazine  5. Leukocytosis: no evidence of infectious etiology as pt afebrile, CXR and u/a unremarkable. ? Stress rxn to above. Will recheck in am to monitor trend. Check urine culture.  6. HYPERLIPIDEMIA: Will check lipid profile. Continue statins.   7. DEPRESSION/ANXIETY: Stable continue current medications.   8. HYPERTENSION: Will hold nephrotoxic agent (lisinopril/HCTZ); continue hydralazine and coreg for blood pressure control.   9. INSOMNIA: Continue Restoril.   10-delusional disorder: Stable, continue Haldol   [id number].  12. Dispo: d/c home where she live with son and  daugher-in-law when medically stable    LOS: 1 day    July 02, 202013, 11:38 AM

## 2011-04-02 NOTE — Progress Notes (Signed)
  Echocardiogram 2D Echocardiogram has been performed.  Heather Jarvis, Heather Jarvis 04/17/2011, 11:50 AM

## 2011-04-02 NOTE — Progress Notes (Signed)
Physical Therapy Evaluation Patient Details Name: Heather Jarvis MRN: [id number] DOB: November 15, 1928 Today's Date: 10-Nov-202013  Problem List:  Patient Active Problem List  Diagnoses  . NEOPLASM OF UNCERTAIN BEHAVIOR OF SKIN  . HYPERLIPIDEMIA  . HYPONATREMIA  . DELUSIONAL DISORDER  . DEPRESSION/ANXIETY  . CHRONIC PAIN SYNDROME  . HYPERTENSION  . OSTEOPOROSIS  . FATIGUE  . URINARY URGENCY  . FRACTURE, WRIST, LEFT  . INSOMNIA  . Cardiac pacemaker in situ  . Orthostatic hypotension  . Fecal impaction  . Syncope  . ARF (acute renal failure)  . Dementia  . Leukocytosis  . Abdominal  pain, other specified site    Past Medical History:  Past Medical History  Diagnosis Date  . Arthritis   . Hyperlipidemia   . Hypertension   . Osteoporosis   . Sick sinus syndrome    Past Surgical History:  Past Surgical History  Procedure Date  . Appendectomy   . Vesicovaginal fistula closure w/ tah   . Tonsillectomy   . Abdominal hysterectomy     PT Assessment/Plan/Recommendation PT Assessment Clinical Impression Statement: Patient presents with significant abdominal pain, lethargy, and disorientation to time and situation. Due to pain and fatigue, patient did not tolerate mobility with PT today. ? if cognitive impairments are baseline. Will follow up with family when family is available. If cognitive impairments are new onset, patient may benefit from SLP eval. Acute PT to follow patient at frequency of 3x/week to address strength and activity tolerance deficits to maximize functional gains and decrease risk of fall. Anticipate patient will need 24/7 assist at time of discharge. If assist is unavailable, recommend SNF for continued therapies prior to discharge home. PT Recommendation/Assessment: Patient will need skilled PT in the acute care venue PT Problem List: Decreased strength;Decreased activity tolerance;Decreased balance;Decreased mobility;Decreased cognition;Decreased knowledge of use of  DME;Decreased safety awareness;Pain Barriers to Discharge: None PT Therapy Diagnosis : Difficulty walking;Generalized weakness;Acute pain PT Plan PT Frequency: Min 3X/week PT Treatment/Interventions: DME instruction;Gait training;Functional mobility training;Therapeutic activities;Therapeutic exercise;Balance training;Cognitive remediation;Patient/family education PT Recommendation Recommendations for Other Services: OT consult Follow Up Recommendations: Home health PT;Skilled nursing facility;Supervision/Assistance - 24 hour Equipment Recommended: Other (comment) (will follow up with family to determine equipment needs) PT Goals  Acute Rehab PT Goals PT Goal Formulation: With patient Time For Goal Achievement: 7 days Pt will go Supine/Side to Sit: with supervision;with HOB not 0 degrees (comment degree) PT Goal: Supine/Side to Sit - Progress: Goal set today Pt will go Sit to Supine/Side: with supervision;with HOB not 0 degrees (comment degree) PT Goal: Sit to Supine/Side - Progress: Goal set today Pt will go Sit to Stand: with supervision;with upper extremity assist PT Goal: Sit to Stand - Progress: Goal set today Pt will go Stand to Sit: with supervision;with upper extremity assist PT Goal: Stand to Sit - Progress: Goal set today Pt will Transfer Bed to Chair/Chair to Bed: with supervision PT Transfer Goal: Bed to Chair/Chair to Bed - Progress: Goal set today Pt will Ambulate: 16 - 50 feet;with least restrictive assistive device;with supervision PT Goal: Ambulate - Progress: Goal set today  PT Evaluation Precautions/Restrictions  Precautions Precautions: Fall Required Braces or Orthoses: No Restrictions Weight Bearing Restrictions: No Prior Functioning  Home Living Lives With: Clarene Duke Help From: Family Type of Home: House Home Layout: One level Home Access: Ramped entrance Bathroom Shower/Tub: Chiropodist: Standard Bathroom Accessibility: Yes How  Accessible: Accessible via walker Home Adaptive Equipment: None Additional Comments: history per patient report, not  consistently oriented per RN. Will follow up with family when family available. Prior Function Level of Independence: Independent with basic ADLs;Independent with homemaking with ambulation;Independent with gait;Independent with transfers;Other (comment) ("My son helped me walk sometimes.") Driving: No Vocation: Retired Comments: history per patient report, not consistently oriented per Therapist, sports. Will follow up with family when family available. Cognition Cognition Arousal/Alertness: Lethargic Overall Cognitive Status: No family/caregiver present to determine baseline cognitive functioning Orientation Level: Oriented to person;Oriented to place;Disoriented to time;Disoriented to situation Cognition - Other Comments: increased processing time Sensation/Coordination Sensation Light Touch: Appears Intact Stereognosis: Not tested Hot/Cold: Not tested Proprioception: Appears Intact Coordination Gross Motor Movements are Fluid and Coordinated: Yes Fine Motor Movements are Fluid and Coordinated: Yes Heel Shin Test: not assessed Extremity Assessment RLE Assessment RLE Assessment: Within Functional Limits (strength grossly 3+/5) LLE Assessment LLE Assessment: Within Functional Limits (strength grossly 3+/5) Mobility (including Balance) Bed Mobility Bed Mobility: Yes Rolling Right: 4: Min assist Rolling Left: 4: Min assist Supine to Sit: 4: Min assist Scooting to HOB: 4: Min assist Transfers Transfers: No (patient declined OOB with PT due to abdominal pain) Ambulation/Gait Ambulation/Gait: No (patient declined OOB with PT due to abdominal pain) Stairs: No (patient declined OOB with PT due to abdominal pain) Wheelchair Mobility Wheelchair Mobility: No  Posture/Postural Control Posture/Postural Control: No significant limitations Balance Balance Assessed: Yes Static Sitting  Balance Static Sitting - Balance Support: Bilateral upper extremity supported;Feet supported Static Sitting - Level of Assistance: 5: Stand by assistance   End of Session PT - End of Session Activity Tolerance: Patient limited by pain Patient left: in bed;with call bell in reach Nurse Communication: Other (comment) (pain and decline of mobility with PT today) General Behavior During Session: [facility] for tasks performed Cognition: Impaired (disoriented, increased processing time. ? if this is baseline. Will follow up with family when family is available.)  Kendrick Ranch 02-27-202013, 3:06 PM

## 2011-04-03 LAB — BASIC METABOLIC PANEL
BUN: 19 mg/dL (ref 6–23)
Chloride: 108 mEq/L (ref 96–112)
GFR calc Af Amer: 64 mL/min — ABNORMAL LOW (ref >90)
GFR calc non Af Amer: 55 mL/min — ABNORMAL LOW (ref >90)
Potassium: 2.4 mEq/L — CL (ref 3.5–5.1)
Sodium: 138 mEq/L (ref 135–145)

## 2011-04-03 LAB — CBC
HCT: 31.3 % — ABNORMAL LOW (ref 36.0–46.0)
Hemoglobin: 10.9 g/dL — ABNORMAL LOW (ref 12.0–15.0)
MCHC: 34.8 g/dL (ref 30.0–36.0)
RBC: 3.22 MIL/uL — ABNORMAL LOW (ref 3.87–5.11)

## 2011-04-03 MED ORDER — LISINOPRIL 20 MG PO TABS
20.0000 mg | ORAL_TABLET | Freq: Every day | ORAL | Status: DC
  Administered 2011-04-03 – 2011-04-06 (×4): 20 mg via ORAL
  Filled 2011-04-03 (×4): qty 1

## 2011-04-03 MED ORDER — POTASSIUM CHLORIDE CRYS ER 20 MEQ PO TBCR
40.0000 meq | EXTENDED_RELEASE_TABLET | ORAL | Status: AC
  Administered 2011-04-03 (×4): 40 meq via ORAL
  Filled 2011-04-03 (×5): qty 2

## 2011-04-03 MED ORDER — MAGNESIUM SULFATE 40 MG/ML IJ SOLN
2.0000 g | Freq: Once | INTRAMUSCULAR | Status: AC
  Administered 2011-04-03: 2 g via INTRAVENOUS
  Filled 2011-04-03: qty 50

## 2011-04-03 NOTE — Progress Notes (Signed)
Physical Therapy Treatment Patient Details Name: Heather Jarvis MRN: [id number] DOB: Oct 05, 1928 Today's Date: 04/03/2011  PT Assessment/Plan  PT - Assessment/Plan Comments on Treatment Session: Pt participated briefly with PT today with encouragment.  She reports she had not been OOB today and continued to refuse, nurse aware and did state pt had some diarreha.  Question if family presence may increase particiaption? PT Plan: Discharge plan remains appropriate PT Goals  Acute Rehab PT Goals PT Goal: Supine/Side to Sit - Progress: Progressing toward goal PT Goal: Sit to Supine/Side - Progress: Progressing toward goal  PT Treatment Precautions/Restrictions  Precautions Precautions: Fall Required Braces or Orthoses: No Restrictions Weight Bearing Restrictions: No Mobility (including Balance) Bed Mobility Rolling Right: 4: Min assist;With rail Rolling Left:  (refused) Supine to Sit: 4: Min assist;With rails;HOB elevated (Comment degrees) Supine to Sit Details (indicate cue type and reason): ; declined supine to sit to L side of bed Scooting to [facility]: 3: Mod assist Transfers Transfers: No (refused)  Static Sitting Balance Static Sitting - Balance Support: Feet supported Static Sitting - Level of Assistance: 5: Stand by assistance Exercise  General Exercises - Lower Extremity Ankle Circles/Pumps: 10 reps;AROM Long Arc Quad: PROM;AROM;Strengthening;10 reps;Seated (tight 2 joint hamstring, unable to fully extend knee) Other Exercises Other Exercises: passive stretch to two joint hamstring with poor tolerance End of Session PT - End of Session Activity Tolerance: Patient limited by pain;Patient limited by fatigue (pt declined OOB but agreed to EOB activity ) Patient left: in bed (after limited exercise, pt refused further) General Behavior During Session:  (not fully participative) Cognition: Impaired (nurse reports sundowning, poor problem solving)  Othelia Pulling 04/03/2011, 3:27 PM

## 2011-04-03 NOTE — Progress Notes (Signed)
CRITICAL VALUE ALERT  Critical value received:  K 2.4  Date of notification:  04/03/2011  Time of notification:  7:22 AM   Critical value read back:yes  Nurse who received alert:  Driggers, Allene Pyo   MD notified (1st page):  Ghimire  Time of first page:  7:23 AM   MD notified (2nd page):  Time of second page:  Responding MD:  ghimire  Time MD responded:  7:27am

## 2011-04-03 NOTE — Progress Notes (Signed)
Patient ID: Heather Jarvis, female   DOB: 08-01-1928, 78 y.o.   MRN: [id number]  PATIENT DETAILS Name: Heather Jarvis Age: 78 y.o. Sex: female Date of Birth: 01-31-1929 Admit Date: 04/01/2011 [id number] W, MD, MD POA:   Subjective: Is generally weak and uncomfortable. Has had multiple BM.  Now watery diarrhea.  Tolerating Clear liquids.   Nothing specific hurts, but she does not feel well.  Objective: Vital signs in last 24 hours: Temp:  [98.9 F (37.2 C)-100.1 F (37.8 C)] 99.3 F (37.4 C) (01/22 0528) Pulse Rate:  [64-77] 64  (01/22 0528) Resp:  [18-20] 20  (01/22 0528) BP: (132-161)/(67-81) 149/78 mmHg (01/22 0528) SpO2:  [96 %-98 %] 98 % (01/22 0528) Weight change:  Last BM Date: 04/03/11  Intake/Output from previous day:  Intake/Output Summary (Last 24 hours) at 04/03/11 1129 Last data filed at 04/03/11 [id number]  Gross per 24 hour  Intake 1543.75 ml  Output      0 ml  Net 1543.75 ml     Physical Exam:  Gen:  Awake, alert in NAD, says few words.  Appears weak Cardiovascular:  A999333 with systolic murmur Respiratory: CTAB, no w/r/c, no increased wob Gastrointestinal: Abdomen soft, NT, BS hypoactive. No rebound or guarding Extremities: no c/c/e   Lab Results:  Lab 04/03/11 0537 04/01/11 1743 04/01/11 1152 04/01/11 1135  HGB 10.9* 12.4 15.0 --  HCT 31.3* 36.1 44.0 --  WBC 13.2* 18.9* -- 14.4*  PLT 121* 187 -- 209     Lab 04/03/11 0537 04/01/11 1152 04/01/11 1135  NA 138 140 138  K 2.4* 4.4 --  CL 108 109 104  CO2 19 -- 22  GLUCOSE 101* 140* 147*  BUN 19 36* 34*  CREATININE 0.95 1.80* 1.63*  CALCIUM 7.8* -- 9.6  MG -- -- --  PHOS -- -- --    Studies/Results: Ct Abdomen Pelvis Wo Contrast  04/01/2011  *RADIOLOGY REPORT*  Clinical Data:  Hypotension, diaphoresis, palpable abdominal mass, concern for ruptured AAA.  CT CHEST, ABDOMEN AND PELVIS WITHOUT CONTRAST   IMPRESSION: No evidence of acute cardiopulmonary disease.  Ectasia of the ascending aortic arch  up to 3.7 cm.  No evidence of thoracic aortic aneurysm.  Moderate superior endplate compression deformity at T12, age indeterminate.  Per CMS PQRS reporting requirements (PQRS Measure 24): Given the patient's age of greater than 76 and the fracture site (spine), the patient should be tested for osteoporosis using DXA, and the appropriate treatment considered based on the DXA results.    CT ABDOMEN AND PELVIS IMPRESSION: Large volume fecal impaction within the distal sigmoid/rectum, likely accounting for the palpable abnormality.  No evidence of abdominal aortic aneurysm.  Multiple hyperdense renal lesions, as described above, possibly hemorrhagic cysts but strictly indeterminate on unenhanced CT.  If clinically warranted, consider unenhanced MRI abdomen for definitive characterization on a non emergent basis.  Original Report Authenticated By: Julian Hy, M.D.   Abd 1 View (kub)  2020/02/1412  *RADIOLOGY REPORT*   IMPRESSION:  1.  Findings compatible with rectal impaction with resultant gaseous distention of the proximal large bowel loops.  Original Report Authenticated By: Angelita Ingles, M.D.   Dg Chest Portable 1 View  04/01/2011  *RADIOLOGY REPORT*  Clinical Data: Abdominal pain  PORTABLE CHEST - 1 VIEW  Comparison: CT 04/01/2011  Findings: Aorta is ectatic and unfolded.  Dual lead left-sided pacemaker noted.  Right hemidiaphragmatic eventration again noted. Mildly diffusely prominent interstitial markings are low without focal pulmonary opacity.  Mild enlargement  of the cardiomediastinal silhouette is present without edema.  No pleural effusion.  No acute osseous finding.  IMPRESSION: No acute cardiopulmonary process.  Original Report Authenticated By: Arline Asp, M.D.   2D Echo: - Left ventricle: The cavity size was normal. Systolic function was vigorous. The estimated ejection fraction was in the range of 65% to 70%. Wall motion was normal; there were no regional wall motion  abnormalities. - Mitral valve: Calcified annulus. Mild regurgitation.  - Right ventricle: The cavity size was mildly dilated. Wall thickness was normal. - Right atrium: The atrium was mildly dilated. - Pulmonary arteries: Systolic pressure was moderately increased. PA peak pressure: 81mm Hg (S).  Medications: Scheduled Meds:    . antiseptic oral rinse  15 mL Mouth Rinse BID  . aspirin EC  81 mg Oral Daily  . calcium-vitamin D  1 tablet Oral BID  . carvedilol  12.5 mg Oral BID WC  . cholecalciferol  1,000 Units Oral Daily  . citalopram  20 mg Oral Daily  . docusate sodium  100 mg Oral BID  . enoxaparin  40 mg Subcutaneous Q24H  . haloperidol  1 mg Oral QHS  . hydrALAZINE  25 mg Oral Q8H  . mulitivitamin with minerals  1 tablet Oral Daily  . polyethylene glycol  17 g Oral Daily  . potassium chloride  40 mEq Oral Q4H  . sodium phosphate  1 enema Rectal Once   Continuous Infusions:    . DISCONTD: sodium chloride 75 mL/hr at 04/03/11 0948   PRN Meds:.acetaminophen, acetaminophen, alum & mag hydroxide-simeth, morphine, ondansetron (ZOFRAN) IV, ondansetron, oxyCODONE, temazepam Antibiotics: Anti-infectives    None     Assessment/Plan:  1-Fecal impaction: Most likely responsible for patient nausea/vomiting and abdominal pain. Patient received enema in the ED which accomplish a good bowel movement.  Has had multiple bowel movements.  Thyroid fx wnl.   2-Pre-syncope: Most likely vasovagal in etiology; also with a coadjuvant of orthostasis. No events on telemetry, 2D echo unrevealing for etiology of pre-syncope.  3. Acute kidney injury: likely prerenal in nature given dehydration and with hypotension on arrival to ED.  Resolved.  Will restart lisinopril.  4. Hypotension: resolved s/p IVF resuscitation.   5. Leukocytosis improving.   no evidence of infectious etiology as pt afebrile, CXR and u/a unremarkable. ? Stress rxn to above. Will recheck in am to monitor trend. Follow urine  culture.  6. HYPERLIPIDEMIA: Continue statins.   7. DEPRESSION/ANXIETY: Stable continue current medications.   8. HYPERTENSION: will restart lisinopril, continue coreg for blood pressure control.   9. INSOMNIA: Continue Restoril.   10-delusional disorder: Stable, continue Haldol   11.DVT: scds  12. Acute anemia - no frank blood loss.  Hgb dropped from 15.0 - to 10.9  Lovenox D/C'd, will check guiac. Use SCD's meanwhile for DVT prophylaxis.  13. Dispo: d/c home where she live with son and daugher-in-law when medically stable  Will advance diet today, correct potassium, request that patient walk hallways with assistance.  Will ask PT to re-eval in am.  Hopefully patient will not need SNF.  Will ask Case Management to arrange home health.    LOS: 2 days    Ruthine Dose [facility] [phone]  04/03/2011, 11:29 AM

## 2011-04-03 NOTE — Progress Notes (Signed)
   CARE MANAGEMENT NOTE 04/03/2011  Patient:  BETHENE, BLOOD   Account Number:  [id number]  Date Initiated:  2020/07/1911  Documentation initiated by:  Elissa Hefty  Subjective/Objective Assessment:   adm w syncope     Action/Plan:   lives w fam, pcp dr Darnell [place]   Anticipated DC Date:  04/04/2011   Anticipated DC Plan:  [place]  CM consult      [place]   Choice offered to / List presented to:  C-1 Patient        [place] arranged  HH-1 RN  [place]      [place] agency  [place]   Status of service:   Medicare Important Message given?   (If response is "NO", the following Medicare IM given date fields will be blank) Date Medicare IM given:   Date Additional Medicare IM given:    Discharge Disposition:  [place]  Per UR Regulation:  Reviewed for med. necessity/level of care/duration of stay  Comments:  1/23 spoke w pt and gave hhc agency list. no pref, ref to debbie w [facility] for hhc, pt has walker and bsc. copy of hhc agency list left in chart. debbie Caylin Raby rn,bsn 1/21 debbie Nolan Tuazon rn,bsn [id number]

## 2011-04-04 LAB — BASIC METABOLIC PANEL
BUN: 15 mg/dL (ref 6–23)
Calcium: 8.4 mg/dL (ref 8.4–10.5)
GFR calc non Af Amer: 54 mL/min — ABNORMAL LOW (ref >90)
Glucose, Bld: 100 mg/dL — ABNORMAL HIGH (ref 70–99)
Sodium: 140 mEq/L (ref 135–145)

## 2011-04-04 LAB — DIFFERENTIAL
Basophils Absolute: 0 10*3/uL (ref 0.0–0.1)
Basophils Relative: 0 % (ref 0–1)
Eosinophils Absolute: 0 10*3/uL (ref 0.0–0.7)
Neutrophils Relative %: 79 % — ABNORMAL HIGH (ref 43–77)

## 2011-04-04 LAB — CBC
MCH: 33.7 pg (ref 26.0–34.0)
MCHC: 35.6 g/dL (ref 30.0–36.0)
Platelets: 203 10*3/uL (ref 150–400)
RDW: 12.7 % (ref 11.5–15.5)

## 2011-04-04 MED ORDER — SODIUM CHLORIDE 0.9 % IV SOLN
INTRAVENOUS | Status: DC
  Administered 2011-04-04 – 2011-04-05 (×2): via INTRAVENOUS

## 2011-04-04 NOTE — Evaluation (Signed)
Occupational Therapy Evaluation Patient Details Name: Heather Jarvis MRN: [id number] DOB: 1929-01-13 Today's Date: 04/04/2011  Problem List:  Patient Active Problem List  Diagnoses  . NEOPLASM OF UNCERTAIN BEHAVIOR OF SKIN  . HYPERLIPIDEMIA  . HYPONATREMIA  . DELUSIONAL DISORDER  . DEPRESSION/ANXIETY  . CHRONIC PAIN SYNDROME  . HYPERTENSION  . OSTEOPOROSIS  . FATIGUE  . URINARY URGENCY  . FRACTURE, WRIST, LEFT  . INSOMNIA  . Cardiac pacemaker in situ  . Orthostatic hypotension  . Fecal impaction  . Syncope  . ARF (acute renal failure)  . Dementia  . Leukocytosis  . Abdominal  pain, other specified site    Past Medical History:  Past Medical History  Diagnosis Date  . Arthritis   . Hyperlipidemia   . Hypertension   . Osteoporosis   . Sick sinus syndrome    Past Surgical History:  Past Surgical History  Procedure Date  . Appendectomy   . Vesicovaginal fistula closure w/ tah   . Tonsillectomy   . Abdominal hysterectomy     OT Assessment/Plan/Recommendation OT Assessment Clinical Impression Statement: Pt admitted due to syncopal episode and hypotension believed to be secondary to fecal impaction (relieved in ED). Patient presents with below problem list and will benefit from skilled OT in the acute setting to maximize I with ADL and ADL mobility in order to facilitate safe d/c home with family OT Recommendation/Assessment: Patient will need skilled OT in the acute care venue OT Problem List: Decreased strength;Decreased activity tolerance;Pain OT Therapy Diagnosis : Generalized weakness;Acute pain OT Plan OT Frequency: Min 2X/week OT Treatment/Interventions: Self-care/ADL training;Therapeutic exercise;Therapeutic activities;Patient/family education OT Recommendation Follow Up Recommendations: No OT follow up Equipment Recommended: None recommended by OT Individuals Consulted Consulted and Agree with Results and Recommendations: Patient;Family  member/caregiver Family Member Consulted: son and daughter-in-law OT Goals Acute Rehab OT Goals OT Goal Formulation: With patient/family Time For Goal Achievement: 7 days ADL Goals Pt Will Perform Grooming: Independently;Standing at sink ADL Goal: Grooming - Progress: Goal set today Pt Will Perform Upper Body Dressing: Independently;Sitting, bed ADL Goal: Upper Body Dressing - Progress: Goal set today Pt Will Perform Lower Body Dressing: Independently;Sit to stand from bed ADL Goal: Lower Body Dressing - Progress: Goal set today Pt Will Transfer to Toilet: Independently;Ambulation;Regular height toilet ADL Goal: Toilet Transfer - Progress: Goal set today  OT Evaluation Precautions/Restrictions  Precautions Precautions: Fall Required Braces or Orthoses: No Restrictions Weight Bearing Restrictions: No Prior Functioning Home Living Lives With: Clarene Duke Help From: Family Type of Home: House Home Layout: One level Home Access: Ramped entrance Bathroom Shower/Tub: Chiropodist: Standard Bathroom Accessibility: Yes How Accessible: Accessible via walker Home Adaptive Equipment: None Prior Function Level of Independence: Independent with basic ADLs;Independent with homemaking with ambulation;Independent with gait;Independent with transfers;Other (comment) Driving: No Vocation: Retired Comments: Per pt. family, patient goes on walks with son or dtr-in-law everyday and does bilateral UE strengthenging exercises with small jugs of water ADL ADL Eating/Feeding: Simulated;Supervision/safety Where Assessed - Eating/Feeding: Edge of bed Grooming: Performed;Minimal assistance Where Assessed - Grooming: Standing at sink Upper Body Bathing: Simulated;Minimal assistance Where Assessed - Upper Body Bathing: Sitting, bed Lower Body Bathing: Simulated;Minimal assistance Where Assessed - Lower Body Bathing: Sit to stand from bed Upper Body Dressing: Simulated;Minimal  assistance Where Assessed - Upper Body Dressing: Sitting, bed Lower Body Dressing: Simulated;Minimal assistance Where Assessed - Lower Body Dressing: Sit to stand from bed Toilet Transfer: Performed;Minimal assistance Toilet Transfer Method: Ambulating Toilet Transfer Equipment: Regular height toilet;Grab  bars Toileting - Clothing Manipulation: Simulated;Minimal assistance Where Assessed - Camera operator Manipulation: Standing Toileting - Hygiene: Simulated;Minimal assistance Where Assessed - Toileting Hygiene: Standing Tub/Shower Transfer: Not assessed Ambulation Related to ADLs: Min hand-held assist throughout room Vision/Perception    Cognition Cognition Cognition - Other Comments: Patient limited in participation with conversation making cognition difficult to assess Sensation/Coordination Sensation Light Touch: Appears Intact Coordination Gross Motor Movements are Fluid and Coordinated: Yes Fine Motor Movements are Fluid and Coordinated: Yes Extremity Assessment RUE Assessment RUE Assessment: Within Functional Limits LUE Assessment LUE Assessment: Within Functional Limits Mobility  Bed Mobility Supine to Sit: 3: Mod assist;HOB flat;With rails Transfers Sit to Stand: 4: Min assist;From bed;From chair/3-in-1;From toilet Stand to Sit: 4: Min assist;To bed;To chair/3-in-1;To toilet End of Session OT - End of Session Equipment Utilized During Treatment: Gait belt Activity Tolerance: Patient tolerated treatment well Patient left: in chair;with call bell in reach;with family/visitor present   Tynan Boesel 04/04/2011, 3:59 PM

## 2011-04-04 NOTE — Progress Notes (Signed)
Patient ID: Heather Jarvis, female   DOB: 1928/09/30, 78 y.o.   MRN: [id number]  78 yo female with a history of psychiatric disorder and sick sinus syndrome s/p pace maker placement, suffered an episode of presyncope while trying to have a bm. She was brought to the ED and found to have a fecal impaction and hypokalemia.  She was disimpacted and her potassium was repleted.  This morning she developed an elevated wbc and diarrhea (the patient doesn't realize she's having diarrhea).  Further her hgb appeared to drop acutely from 15 to 10 without frank bleeding.    PATIENT DETAILS Name: Heather Jarvis Age: 78 y.o. Sex: female Date of Birth: 30-Mar-1928 Admit Date: 04/01/2011 [id number] W, MD, MD POA:   Subjective:  Wants to go home.  Tolerating diet.  Denies pain.  Does not realize she's had 5 diarrhea bms so far today.  Objective: Vital signs in last 24 hours: Temp:  [99.1 F (37.3 C)-99.6 F (37.6 C)] 99.1 F (37.3 C) (01/23 1305) Pulse Rate:  [51-74] 57  (01/23 1305) Resp:  [19-20] 20  (01/23 1305) BP: (133-153)/(62-68) 153/62 mmHg (01/23 1305) SpO2:  [94 %-98 %] 95 % (01/23 1305) Weight change:  Last BM Date: 04/04/11  Intake/Output from previous day:  Intake/Output Summary (Last 24 hours) at 04/04/11 1545 Last data filed at 04/04/11 0900  Gross per 24 hour  Intake    530 ml  Output      0 ml  Net    530 ml   Tele: A- paced at 70 bpm.   Physical Exam:  Gen:  Awake, alert in NAD, says few words. No teeth.  Appears weak Cardiovascular:  A999333 with systolic murmur Respiratory: CTAB, no w/r/c, no increased wob Gastrointestinal: Abdomen soft, NT, BS present and normal. No rebound or guarding Extremities: no c/c/e   Lab Results:  Lab 04/04/11 0615 04/03/11 0537 04/01/11 1743  HGB 11.9* 10.9* 12.4  HCT 33.4* 31.3* 36.1  WBC 17.2* 13.2* 18.9*  PLT 203 121* 187     Lab 04/04/11 0615 04/03/11 0537 04/01/11 1152 04/01/11 1135  NA 140 138 140 138  K 3.6 2.4* -- --  CL  110 108 109 104  CO2 18* 19 -- 22  GLUCOSE 100* 101* 140* 147*  BUN 15 19 36* 34*  CREATININE 0.97 0.95 1.80* 1.63*  CALCIUM 8.4 7.8* -- 9.6  MG -- -- -- --  PHOS -- -- -- --    Studies/Results: Ct Abdomen Pelvis Wo Contrast  04/01/2011  *RADIOLOGY REPORT*  Clinical Data:  Hypotension, diaphoresis, palpable abdominal mass, concern for ruptured AAA.  CT CHEST, ABDOMEN AND PELVIS WITHOUT CONTRAST   IMPRESSION: No evidence of acute cardiopulmonary disease.  Ectasia of the ascending aortic arch up to 3.7 cm.  No evidence of thoracic aortic aneurysm.  Moderate superior endplate compression deformity at T12, age indeterminate.  Per CMS PQRS reporting requirements (PQRS Measure 24): Given the patient's age of greater than 41 and the fracture site (spine), the patient should be tested for osteoporosis using DXA, and the appropriate treatment considered based on the DXA results.    CT ABDOMEN AND PELVIS IMPRESSION: Large volume fecal impaction within the distal sigmoid/rectum, likely accounting for the palpable abnormality.  No evidence of abdominal aortic aneurysm.  Multiple hyperdense renal lesions, as described above, possibly hemorrhagic cysts but strictly indeterminate on unenhanced CT.  If clinically warranted, consider unenhanced MRI abdomen for definitive characterization on a non emergent basis.  Original Report Authenticated By:  Julian Hy, M.D.   Abd 1 View (kub)  June 26, 202013  *RADIOLOGY REPORT*   IMPRESSION:  1.  Findings compatible with rectal impaction with resultant gaseous distention of the proximal large bowel loops.  Original Report Authenticated By: Angelita Ingles, M.D.   Dg Chest Portable 1 View  04/01/2011  *RADIOLOGY REPORT*  Clinical Data: Abdominal pain  PORTABLE CHEST - 1 VIEW  Comparison: CT 04/01/2011  Findings: Aorta is ectatic and unfolded.  Dual lead left-sided pacemaker noted.  Right hemidiaphragmatic eventration again noted. Mildly diffusely prominent interstitial  markings are low without focal pulmonary opacity.  Mild enlargement of the cardiomediastinal silhouette is present without edema.  No pleural effusion.  No acute osseous finding.  IMPRESSION: No acute cardiopulmonary process.  Original Report Authenticated By: Arline Asp, M.D.   2D Echo: - Left ventricle: The cavity size was normal. Systolic function was vigorous. The estimated ejection fraction was in the range of 65% to 70%. Wall motion was normal; there were no regional wall motion abnormalities. - Mitral valve: Calcified annulus. Mild regurgitation.  - Right ventricle: The cavity size was mildly dilated. Wall thickness was normal. - Right atrium: The atrium was mildly dilated. - Pulmonary arteries: Systolic pressure was moderately increased. PA peak pressure: 70mm Hg (S).  Medications: Scheduled Meds:    . antiseptic oral rinse  15 mL Mouth Rinse BID  . aspirin EC  81 mg Oral Daily  . calcium-vitamin D  1 tablet Oral BID  . carvedilol  12.5 mg Oral BID WC  . cholecalciferol  1,000 Units Oral Daily  . citalopram  20 mg Oral Daily  . haloperidol  1 mg Oral QHS  . lisinopril  20 mg Oral Daily  . magnesium sulfate 1 - 4 g bolus IVPB  2 g Intravenous Once  . mulitivitamin with minerals  1 tablet Oral Daily  . potassium chloride  40 mEq Oral Q4H  . DISCONTD: hydrALAZINE  25 mg Oral Q8H   Continuous Infusions:    . sodium chloride     PRN Meds:.acetaminophen, acetaminophen, alum & mag hydroxide-simeth, morphine, ondansetron (ZOFRAN) IV, ondansetron, oxyCODONE, temazepam Antibiotics: Anti-infectives    None     Assessment/Plan:  1-Fecal impaction: Most likely responsible for patient nausea/vomiting and abdominal pain. Patient received enema in the ED which accomplish a good bowel movement.  Has had multiple bowel movements.  Thyroid fx wnl. As of 04/04/11 patient developed diarrhea and elevated wbc.  C-diff Pending.  All stool softeners and enemas were D/C'd 1/22.  Patient  did receive 2g of magnesium last night.  Finally will restart gentle IV hydration because of multiple diarrhea episodes.  2-Pre-syncope: Most likely vasovagal in etiology; also with a coadjuvant of orthostasis. No events on telemetry, 2D echo unrevealing for etiology of pre-syncope.  Will d/c tele.  3. Acute kidney injury: likely prerenal in nature given dehydration and with hypotension on arrival to ED.  Resolved.  Will restart lisinopril and continue to hold hctz  4. Hypotension: resolved s/p IVF resuscitation.   5. Leukocytosis: patient afebrile.  Checking c-diff.  6. HYPERLIPIDEMIA: Continue statins.   7. DEPRESSION/ANXIETY: Stable continue current medications.   8. HYPERTENSION: will restart lisinopril & hctz, continue coreg for blood pressure control.   9. INSOMNIA: Continue Restoril.   10-delusional disorder: Stable, continue Haldol   11.DVT: scds  12. Acute anemia - no frank blood loss.  Hgb dropped from 15.0 - to 10.9  Lovenox D/C'd 1/22, Patient has no obvious source of blood  loss and is guiac negative.  Hgb is stable today at 11.9 (1/23). Use SCD's meanwhile for DVT prophylaxis.  13. Dispo: d/c home where she has 24 hour supervision with son and daugher-in-law -  when medically stable Case management is arranging home health.   LOS: 3 days    Ruthine Dose [facility] [phone]  04/04/2011, 3:45 PM

## 2011-04-05 LAB — CBC
MCH: 34 pg (ref 26.0–34.0)
Platelets: 209 10*3/uL (ref 150–400)
RBC: 3.53 MIL/uL — ABNORMAL LOW (ref 3.87–5.11)
WBC: 15.8 10*3/uL — ABNORMAL HIGH (ref 4.0–10.5)

## 2011-04-05 LAB — CLOSTRIDIUM DIFFICILE BY PCR: Toxigenic C. Difficile by PCR: NEGATIVE

## 2011-04-05 LAB — BASIC METABOLIC PANEL
Calcium: 8.2 mg/dL — ABNORMAL LOW (ref 8.4–10.5)
GFR calc non Af Amer: 66 mL/min — ABNORMAL LOW (ref >90)
Glucose, Bld: 96 mg/dL (ref 70–99)
Sodium: 136 mEq/L (ref 135–145)

## 2011-04-05 LAB — URINALYSIS, ROUTINE W REFLEX MICROSCOPIC
Ketones, ur: NEGATIVE mg/dL
Leukocytes, UA: NEGATIVE
Nitrite: NEGATIVE
pH: 5.5 (ref 5.0–8.0)

## 2011-04-05 LAB — URINE MICROSCOPIC-ADD ON

## 2011-04-05 MED ORDER — POTASSIUM CHLORIDE CRYS ER 20 MEQ PO TBCR
40.0000 meq | EXTENDED_RELEASE_TABLET | Freq: Once | ORAL | Status: AC
  Administered 2011-04-05: 40 meq via ORAL
  Filled 2011-04-05: qty 2

## 2011-04-05 NOTE — Plan of Care (Signed)
Problem: Phase I Progression Outcomes Goal: Initial discharge plan identified Outcome: Completed/Met Date Met:  04/05/11 To return home with son  Problem: Phase III Progression Outcomes Goal: IV/normal saline lock discontinued Outcome: Completed/Met Date Met:  04/05/11 Came out last night and pt. Refused to get another IV

## 2011-04-05 NOTE — Progress Notes (Signed)
Pt removed her IV & refuse to have a new IV inserted. The site that the IV was removed the IV from was unremarkable. The catheter was intact upon removal. I spoke with Baltazar Najjar and she stated to keep the IV out since the patient refused to allow anyone to reinsert a new IV.

## 2011-04-05 NOTE — Progress Notes (Signed)
Physical Therapy Treatment Patient Details Name: Heather Jarvis MRN: [id number] DOB: 09-04-1928 Today's Date: 04/05/2011  PT Assessment/Plan  PT - Assessment/Plan Comments on Treatment Session: Pt self limiting and requires alot of encouragement for activity. Pt incontinent in bed and required A for pericare PT Plan: Discharge plan remains appropriate PT Frequency: Min 3X/week Follow Up Recommendations: Home health PT;Supervision/Assistance - 24 hour;Skilled nursing facility Equipment Recommended: None recommended by PT PT Goals  Acute Rehab PT Goals PT Goal: Supine/Side to Sit - Progress: Met PT Goal: Sit to Stand - Progress: Progressing toward goal PT Goal: Stand to Sit - Progress: Progressing toward goal PT Transfer Goal: Bed to Chair/Chair to Bed - Progress: Progressing toward goal PT Goal: Ambulate - Progress: Progressing toward goal  PT Treatment Precautions/Restrictions  Precautions Precautions: Fall Required Braces or Orthoses: No Restrictions Weight Bearing Restrictions: No Mobility (including Balance) Bed Mobility Supine to Sit: With rails;5: Supervision Supine to Sit Details (indicate cue type and reason): Cues for safety Transfers Sit to Stand: 4: Min assist;From bed;With upper extremity assist Sit to Stand Details (indicate cue type and reason): A for balance and to initiate safe stand Stand to Sit: 4: Min assist;To chair/3-in-1;With armrests;With upper extremity assist Stand to Sit Details: Cues to control descent into chair.  Ambulation/Gait Ambulation/Gait: Yes Ambulation/Gait Assistance: 4: Min assist Ambulation/Gait Assistance Details (indicate cue type and reason): A for balance. Pt fatiques quickly Ambulation Distance (Feet): 40 Feet Assistive device: 1 person hand held assist Gait Pattern: Step-through pattern;Decreased stride length;Shuffle;Trunk flexed    Exercise    End of Session PT - End of Session Equipment Utilized During Treatment: Gait  belt Activity Tolerance: Patient limited by fatigue Patient left: in chair;with call bell in reach Nurse Communication: Mobility status for transfers;Mobility status for ambulation General Behavior During Session: [facility] for tasks performed Cognition: [facility] for tasks performed  Jacqualyn Posey 04/05/2011, 10:11 AM 04/05/2011 Jacqualyn Posey PTA [phone] pager [phone] office

## 2011-04-05 NOTE — Progress Notes (Signed)
PATIENT DETAILS Name: Heather Jarvis Age: 78 y.o. Sex: female Date of Birth: [birth year] Admit Date: 04/01/2011 [id number] W, MD, MD POA:   CONSULTS: None  Subjective: Pt without any complaints. Pt unaware of diarrhea, but nurse reports at least 6 loose stools overnight. Stool sample was never sent for cdiff. Pt and pt's son anxious for d/c home.  Objective: Vital signs in last 24 hours: Temp:  [98.3 F (36.8 C)-99.2 F (37.3 C)] 99.2 F (37.3 C) (01/24 0559) Pulse Rate:  [57-71] 71  (01/24 0559) Resp:  [18-20] 18  (01/24 0559) BP: (153-159)/(62-75) 159/66 mmHg (01/24 0559) SpO2:  [95 %-99 %] 95 % (01/24 0559) Weight change:  Last BM Date: 04/04/11  Intake/Output from previous day: No intake or output data in the 24 hours ending 04/05/11 1137   Physical Exam:  Gen:  Awake, alert in nad Cardiovascular:  S1S2 RRR, no m/r/g Respiratory: CTAB, no w/r/c, no increased wob Gastrointestinal: Abdomen soft, NT/ND, no rebound or guarding Extremities: no c/c/e   Lab Results:  Lab 04/05/11 0645 04/04/11 0615 04/03/11 0537  HGB 12.0 11.9* 10.9*  HCT 33.6* 33.4* 31.3*  WBC 15.8* 17.2* 13.2*  PLT 209 203 121*     Lab 04/05/11 0645 04/04/11 0615 04/03/11 0537 04/01/11 1152 04/01/11 1135  NA 136 140 138 140 138  K 3.4* 3.6 -- -- --  CL 108 110 108 109 104  CO2 15* 18* 19 -- 22  GLUCOSE 96 100* 101* 140* 147*  BUN 12 15 19  36* 34*  CREATININE 0.82 0.97 0.95 1.80* 1.63*  CALCIUM 8.2* 8.4 7.8* -- 9.6  MG -- -- -- -- --  PHOS -- -- -- -- --    Studies/Results: No results found.  Medications: Scheduled Meds:   . antiseptic oral rinse  15 mL Mouth Rinse BID  . aspirin EC  81 mg Oral Daily  . calcium-vitamin D  1 tablet Oral BID  . carvedilol  12.5 mg Oral BID WC  . cholecalciferol  1,000 Units Oral Daily  . citalopram  20 mg Oral Daily  . haloperidol  1 mg Oral QHS  . lisinopril  20 mg Oral Daily  . mulitivitamin with minerals  1 tablet Oral Daily    Continuous Infusions:   . sodium chloride 50 mL/hr at 04/05/11 0120   PRN Meds:.acetaminophen, acetaminophen, alum & mag hydroxide-simeth, morphine, ondansetron (ZOFRAN) IV, ondansetron, oxyCODONE, temazepam Antibiotics: Anti-infectives    None       Assessment/Plan: 78 yo female with a history of psychiatric disorder and sick sinus syndrome s/p pace maker placement, suffered an episode of presyncope while trying to have a bm. She was brought to the ED and found to have a fecal impaction and hypokalemia. She was disimpacted and her potassium was repleted. On 1/23, pt developed an elevated wbc and diarrhea (the patient doesn't realize she's having diarrhea). Given persistent nature of diarrhea, she is now being worked up for [employer].   1-Fecal impaction: Most likely responsible for patient nausea/vomiting and abdominal pain present on admission and now resolved.. Patient received enema in the ED and again on floor, which accomplish a good bowel movement. Has had multiple bowel movements. Thyroid fx wnl. As of 04/04/11 patient developed diarrhea and elevated wbc. C-diff Pending. All stool softeners and enemas were D/C'd 1/22. Patient did receive 2g of magnesium which could be cause of loose stool vs over treatment of constipation. If cdff negative, can d/c pt home with close pcp f/u  2-Pre-syncope: Most likely  vasovagal in etiology; also with a coadjuvant of orthostasis. No events on telemetry, 2D echo unrevealing for etiology of pre-syncope.   3. Acute kidney injury: likely prerenal in nature given dehydration and with hypotension on arrival to ED. Resolved. Pt's ACEI was restarted previously and hctz remains on hold d/t diarrhea and risk for dehydration  4. Hypotension: resolved s/p IVF resuscitation.   5. Leukocytosis: Improved. patient afebrile. NT appearing. CXR/ua on admit both unremarkable. Checking c-diff.   6.  Acute anemia - no frank blood loss. Hgb dropped from 15.0 - to 10.9  Lovenox D/C'd 1/22, Patient has no obvious source of blood loss and is guiac negative. Hgb continues to stabliize now at 12.0 from 11.9 (1/23). Use SCD's meanwhile for DVT prophylaxis.   7. Hypokalemia: secondary to GI loss. replete  8. HYPERLIPIDEMIA: Continue statins.   9. DEPRESSION/ANXIETY: Stable continue current medications.   10. HYPERTENSION: on lisinopril and continue coreg for blood pressure control.   11. INSOMNIA: Continue Restoril.   12. delusional disorder: Stable, continue Haldol   13. DVT: scds   14. Dispo: d/c home where she has 24 hour supervision with son and daugher-in-law - when medically stable. Possibly later today if cdiff negative.      LOS: 4 days    04/05/2011, 11:37 AM

## 2011-04-06 DIAGNOSIS — E876 Hypokalemia: Secondary | ICD-10-CM

## 2011-04-06 LAB — BASIC METABOLIC PANEL
CO2: 21 mEq/L (ref 19–32)
Chloride: 107 mEq/L (ref 96–112)
Potassium: 3.3 mEq/L — ABNORMAL LOW (ref 3.5–5.1)
Sodium: 138 mEq/L (ref 135–145)

## 2011-04-06 LAB — CBC
MCV: 94.2 fL (ref 78.0–100.0)
Platelets: 231 10*3/uL (ref 150–400)
RBC: 3.29 MIL/uL — ABNORMAL LOW (ref 3.87–5.11)
WBC: 12.2 10*3/uL — ABNORMAL HIGH (ref 4.0–10.5)

## 2011-04-06 MED ORDER — POLYETHYLENE GLYCOL 3350 17 GM/SCOOP PO POWD: 17.0000 g | Freq: Every day | ORAL | Status: AC

## 2011-04-06 MED ORDER — ACETAMINOPHEN 325 MG PO TABS: 650.0000 mg | ORAL_TABLET | Freq: Four times a day (QID) | ORAL | Status: DC | PRN

## 2011-04-06 MED ORDER — POTASSIUM CHLORIDE ER 10 MEQ PO TBCR: 20.0000 meq | EXTENDED_RELEASE_TABLET | Freq: Every day | ORAL | Status: DC

## 2011-04-06 MED ORDER — POTASSIUM CHLORIDE CRYS ER 20 MEQ PO TBCR
40.0000 meq | EXTENDED_RELEASE_TABLET | Freq: Once | ORAL | Status: AC
  Administered 2011-04-06: 40 meq via ORAL
  Filled 2011-04-06: qty 2

## 2011-04-06 NOTE — Progress Notes (Signed)
Utilization Review Completed.Donne Anon T1/25/2013

## 2011-04-06 NOTE — Discharge Summary (Signed)
Patient ID: Heather Jarvis MRN: [id number] DOB/AGE: 1928/08/20 78 y.o.  Admit date: 04/01/2011 Discharge date: 04/06/2011  Primary Care Physician:  Heather Post, MD, MD  Discharge Diagnoses:    Present on Admission:  Principal Problem:  *Fecal impaction Active Problems:  HYPERLIPIDEMIA  DEPRESSION/ANXIETY  HYPERTENSION  INSOMNIA  Cardiac pacemaker in situ  Orthostatic hypotension  Syncope  ARF (acute renal failure)  Dementia  Leukocytosis  Abdominal  pain, other specified site  Hypokalemia   Medication List  As of 04/06/2011 11:40 AM   TAKE these medications         acetaminophen 325 MG tablet   Commonly known as: TYLENOL   Take 2 tablets (650 mg total) by mouth every 6 (six) hours as needed (or Fever >/= 101).      aspirin 81 MG tablet   Take 81 mg by mouth daily.      calcium-vitamin D 500-200 MG-UNIT per tablet   Commonly known as: OSCAL WITH D   Take 1 tablet by mouth 2 (two) times daily.      carvedilol 12.5 MG tablet   Commonly known as: COREG   Take 12.5 mg by mouth 2 (two) times daily with a meal.      cholecalciferol 1000 UNITS tablet   Commonly known as: VITAMIN D   Take 1,000 Units by mouth daily.      citalopram 20 MG tablet   Commonly known as: CELEXA   Take 20 mg by mouth daily.      donepezil 10 MG tablet   Commonly known as: ARICEPT   Take 10 mg by mouth at bedtime as needed. For memory      ergocalciferol 50000 UNITS capsule   Commonly known as: VITAMIN D2   Take 50,000 Units by mouth once a week. On Fridays      haloperidol 1 MG tablet   Commonly known as: HALDOL   Take 1 mg by mouth at bedtime.      hydrALAZINE 25 MG tablet   Commonly known as: APRESOLINE   Take 25 mg by mouth 2 (two) times daily.      ibandronate 150 MG tablet   Commonly known as: BONIVA   Take 150 mg by mouth every 30 (thirty) days. Take in the morning with a full glass of water, on an empty stomach, and do not take anything else by mouth or lie down for the  next 30 min. (take on the 6th day of each month)      lisinopril-hydrochlorothiazide 20-12.5 MG per tablet   Commonly known as: PRINZIDE,ZESTORETIC   Take 1 tablet by mouth daily.      mulitivitamin with minerals Tabs   Take 1 tablet by mouth daily. Centrum silver      oxyCODONE 20 MG 12 hr tablet   Commonly known as: OXYCONTIN   Take 20 mg by mouth every 12 (twelve) hours.      polyethylene glycol powder powder   Commonly known as: GLYCOLAX/MIRALAX   Take 17 g by mouth daily.      temazepam 15 MG capsule   Commonly known as: RESTORIL   Take 15 mg by mouth at bedtime as needed. For sleep            Consults:  None   Brief Hospital Course:  78 yo female with a history of psychiatric disorder and sick sinus syndrome s/p pace maker placement, suffered an episode of presyncope while trying to have a bm. She was brought to  the ED and found to have a fecal impaction and hypokalemia. She was disimpacted and her potassium was repleted.  Her acute renal failure resolved with IV Fluid hydration. On 1/23, pt developed an elevated wbc and diarrhea (the patient doesn't realize she's having diarrhea). Given persistent nature of diarrhea, she was worked up for cdiff and found to be negative.  She continued to have 5-6 diarrhea bowel movements a day until the night of 1/24.  She has had no BMs this morning and is anxious for D/C.  Her white count is trending down.  She is fortunate to live with her son and daughter in law.  She has 24 hour care from her family.  On bmet 1/25 she is still mildly hypo kalemic.  She will receive potassium before she leaves as well as a prescription for two more days of potassium (40 meq a day x 2 days).  Her blood pressure has remained slightly elevated.  Her previous blood pressure medications will be restarted on discharge.   Physical Exam on Discharge: General: Alert, awake, oriented x3, in no acute distress, no teeth.  Heather Jarvis calls me by name this morning and  appears more energetic. HEENT: No bruits, no goiter. Heart: Regular rate and rhythm, without murmurs, rubs, gallops. Lungs: Clear to auscultation bilaterally. Abdomen: Soft, nontender, nondistended, positive bowel sounds. Extremities: No clubbing cyanosis or edema with positive pedal pulses. Neuro: Grossly intact, nonfocal.  Filed Vitals:   04/05/11 1347 04/05/11 1749 04/05/11 2115 04/06/11 0520  BP: 146/58 150/79 153/71 151/74  Pulse: 62 60 70 64  Temp: 98.2 F (36.8 C)  98.3 F (36.8 C) 97.8 F (36.6 C)  TempSrc: Oral     Resp: 18  18 18   Height:      Weight:      SpO2: 99%  96% 97%     Intake/Output Summary (Last 24 hours) at 04/06/11 1140 Last data filed at 04/05/11 1828  Gross per 24 hour  Intake    480 ml  Output      0 ml  Net    480 ml    Basic Metabolic Panel:  Lab [id number] 0926 04/05/11 0645  NA 138 136  K 3.3* 3.4*  CL 107 108  CO2 21 15*  GLUCOSE 121* 96  BUN 13 12  CREATININE 0.91 0.82  CALCIUM 7.8* 8.2*  MG -- --  PHOS -- --   Liver Function Tests:  Lab 04/01/11 1743 04/01/11 1135  AST 56* 35  ALT 45* 29  ALKPHOS 198* 163*  BILITOT 0.4 0.4  PROT 6.2 7.0  ALBUMIN 3.3* 3.7    Lab 04/01/11 1135  LIPASE 45  AMYLASE --   CBC:  Lab 04/06/11 0926 04/05/11 0645 04/04/11 0615 04/01/11 1135  WBC 12.2* 15.8* -- --  NEUTROABS -- -- 13.6* 10.8*  HGB 11.0* 12.0 -- --  HCT 31.0* 33.6* -- --  MCV 94.2 95.2 -- --  PLT 231 209 -- --   Cardiac Enzymes:  Lab 04/01/11 1140  CKTOTAL 90  CKMB 1.5  CKMBINDEX --  TROPONINI <0.30   Fasting Lipid Panel:  Lab 04/02/11 0617  CHOL 121  HDL 51  LDLCALC 59  TRIG 56  CHOLHDL 2.4  LDLDIRECT --   Thyroid Function Tests:  Lab 04/01/11 1743  TSH 0.881  T4TOTAL --  FREET4 --  T3FREE --  THYROIDAB --   Coagulation:  Lab 04/01/11 1135  LABPROT 14.0  INR 1.06    Micro Results: C Difficile  PCR Negative.  Other Pertinent Lab Results:    Significant Diagnostic Studies:  Ct Abdomen Pelvis  Wo Contrast  04/01/2011  *RADIOLOGY REPORT*  Clinical Data:  Hypotension, diaphoresis, palpable abdominal mass, concern for ruptured AAA.  CT CHEST, ABDOMEN AND PELVIS WITHOUT CONTRAST    IMPRESSION: Large volume fecal impaction within the distal sigmoid/rectum, likely accounting for the palpable abnormality.  No evidence of abdominal aortic aneurysm.  Multiple hyperdense renal lesions, as described above, possibly hemorrhagic cysts but strictly indeterminate on unenhanced CT.  If clinically warranted, consider unenhanced MRI abdomen for definitive characterization on a non emergent basis.    Abd 1 View (kub)  Nov 27, 202013  *RADIOLOGY REPORT*  Clinical Data: Fecal impaction  ABDOMEN - 1 VIEW  Comparison: None  Findings: Gaseous distention of the colon is identified up to the level of the rectum.  Prominent desiccated stool ball is identified within the central pelvis within the rectum.  A few air-filled loops of small bowel are noted within the right lower quadrant of the abdomen.  IMPRESSION:  1.  Findings compatible with rectal impaction with resultant gaseous distention of the proximal large bowel loops.    Ct Chest Wo Contrast  04/01/2011  *RADIOLOGY REPORT*  Clinical Data:  Hypotension, diaphoresis, palpable abdominal mass, concern for ruptured AAA.  CT CHEST, ABDOMEN AND PELVIS WITHOUT CONTRAST   IMPRESSION: No evidence of acute cardiopulmonary disease.  Ectasia of the ascending aortic arch up to 3.7 cm.  No evidence of thoracic aortic aneurysm.  Moderate superior endplate compression deformity at T12, age indeterminate.  Per CMS PQRS reporting requirements (PQRS Measure 24): Given the patient's age of greater than 46 and the fracture site (spine), the patient should be tested for osteoporosis using DXA, and the appropriate treatment considered based on the DXA results.   CT ABDOMEN AND PELVIS  IMPRESSION: Large volume fecal impaction within the distal sigmoid/rectum, likely accounting for the palpable  abnormality.  No evidence of abdominal aortic aneurysm.  Multiple hyperdense renal lesions, as described above, possibly hemorrhagic cysts but strictly indeterminate on unenhanced CT.  If clinically warranted, consider unenhanced MRI abdomen for definitive characterization on a non emergent basis.    Dg Chest Portable 1 View  04/01/2011  *RADIOLOGY REPORT*  Clinical Data: Abdominal pain  PORTABLE CHEST - 1 VIEW  Comparison: CT 04/01/2011  Findings: Aorta is ectatic and unfolded.  Dual lead left-sided pacemaker noted.  Right hemidiaphragmatic eventration again noted. Mildly diffusely prominent interstitial markings are low without focal pulmonary opacity.  Mild enlargement of the cardiomediastinal silhouette is present without edema.  No pleural effusion.  No acute osseous finding.  IMPRESSION: No acute cardiopulmonary process.    2D Echocardiogram:  Study Conclusions  - Left ventricle: The cavity size was normal. Systolic function was vigorous. The estimated ejection fraction was in the range of 65% to 70%. Wall motion was normal; there were no regional wall motion abnormalities. - Mitral valve: Calcified annulus. Mild regurgitation. - Right ventricle: The cavity size was mildly dilated. Wall thickness was normal. - Right atrium: The atrium was mildly dilated. - Pulmonary arteries: Systolic pressure was moderately increased. PA peak pressure: 49mm Hg (S).      Disposition and Follow-up: Stable for discharge to home in the care of her family.  Discharge Orders    Future Appointments: Provider: Department: Dept Phone: Center:   04/11/2011 3:00 PM Heather Post, MD [facility] [phone] [facility]   04/19/2011 1:30 PM Heather Post, MD [facility] [phone] [facility]  Future Orders Please Complete By Expires   Diet - low sodium heart healthy           Increase activity slowly           Discharge instructions      Comments:   Ensure patient has a stool every 1 or 2 days.   If patient goes 3 days without a stool give milk of magnesia and/ or fleets enema.  If the patient continues to have diarrhea - do not give her Zestoretic and call Dr. Elease Hashimoto to see her before 1/30.     Follow-up Information    Follow up with Heather Post, MD on 04/11/2011. (See Dr. Elease Hashimoto at 3:00 pm on 1/30)    Contact information:   [phone]   We request that the patient have a cbc and bmet drawn on 1/30 to ensure that her wbc and potassium are with in normal limits.         Time spent on Discharge: 40 min.  SignedMelton Alar 04/06/2011, 11:40 AM [phone]

## 2011-04-06 NOTE — Progress Notes (Signed)
04/06/11 NSG 1215 Patient discharged to home with family, discharged instructions given and reviewed with patient and son.  Both verbalized understanding, care notes given for new meds and pertinent education. Skin intact at discharge, sacrum very exoriated. Patient escorted to car via wheelchair by volunteer service.  Alphonzo Lemmings, RN

## 2011-04-09 LAB — STOOL CULTURE

## 2011-04-11 ENCOUNTER — Encounter: Payer: Self-pay | Admitting: Family Medicine

## 2011-04-11 ENCOUNTER — Ambulatory Visit (INDEPENDENT_AMBULATORY_CARE_PROVIDER_SITE_OTHER): Payer: Medicare Other | Admitting: Family Medicine

## 2011-04-11 VITALS — BP 142/82 | Temp 98.2°F | Wt 132.0 lb

## 2011-04-11 DIAGNOSIS — E876 Hypokalemia: Secondary | ICD-10-CM

## 2011-04-11 DIAGNOSIS — K5900 Constipation, unspecified: Secondary | ICD-10-CM | POA: Diagnosis not present

## 2011-04-11 DIAGNOSIS — D72829 Elevated white blood cell count, unspecified: Secondary | ICD-10-CM

## 2011-04-11 NOTE — Patient Instructions (Signed)
Foods Rich in Potassium Food / Potassium (mg)  Apricots, dried,  cup / 378 mg   Apricots, raw, 1 cup halves / 401 mg   Avocado,  / 487 mg   Banana, 1 large / 487 mg   Beef, lean, round, 3 oz / 202 mg   Cantaloupe, 1 cup cubes / 427 mg   Dates, medjool, 5 whole / 835 mg   Ham, cured, 3 oz / 212 mg   Lentils, dried,  cup / 458 mg   Lima beans, frozen,  cup / 258 mg   Orange, 1 large / 333 mg   Orange juice, 1 cup / 443 mg   Peaches, dried,  cup / 398 mg   Peas, split, cooked,  cup / 355 mg   Potato, boiled, 1 medium / 515 mg   Prunes, dried, uncooked,  cup / 318 mg   Raisins,  cup / 309 mg   Salmon, pink, raw, 3 oz / 275 mg   Sardines, canned , 3 oz / 338 mg   Tomato, raw, 1 medium / 292 mg   Tomato juice, 6 oz / 417 mg   [place], 3 oz / 349 mg  Document Released: 02/26/2005 Document Revised: 11/08/2010 Document Reviewed: 07/12/2008 [facility] Patient Information 2012 [place], [place].  Continue with daily stool softener.  Use miralax as needed.  Walk more as tolerated.

## 2011-04-11 NOTE — Progress Notes (Signed)
  Subjective:    Patient ID: Heather Jarvis, female    DOB: August 25, 1928, 78 y.o.   MRN: [id number]  HPI  Hospital followup. Patient has multiple chronic problems including history of hyperlipidemia, psychiatric disorder with delusions, history depression, osteoporosis, hypertension, sick sinus syndrome with previous pacemaker. She had presyncopal episode while attempting to have bowel movement. Was taken to the emergency department and found to have fecal impaction. She was admitted and given IV fluids. She developed some hypokalemia with potassium of 2.4. Potassium at discharge 3.3. She subsequently developed some diarrhea in the hospital. C. difficile testing negative. No further diarrhea. She has not had bowel movement in the past couple days. Family is now giving daily stool softener and occasional MiraLax.  Has chronic pain syndrome has been on opioids for several years. Currently takes OxyContin 20 mg twice a day. She denies any abdominal pain. Appetite is fair. She had mildly elevated white blood cell during hospitalization but no evidence for acute infection. She is on any fever or chills. Chest x-Ingalsbe showed no acute disease. CT abdomen and pelvis showed large fecal impaction but no other acute findings. Blood pressure medication held during hospitalization and they resume that at this time. No orthostasis.  Past Medical History  Diagnosis Date  . Arthritis   . Hyperlipidemia   . Hypertension   . Osteoporosis   . Sick sinus syndrome    Past Surgical History  Procedure Date  . Appendectomy   . Vesicovaginal fistula closure w/ tah   . Tonsillectomy   . Abdominal hysterectomy     reports that she has never smoked. She quit smokeless tobacco use about 41 years ago. She reports that she does not drink alcohol or use illicit drugs. family history includes Diabetes in her mother; Heart disease in her brother and mother; and Hypertension in her mother and sister. No Known Allergies    Review  of Systems  Constitutional: Negative for fever, chills, appetite change and unexpected weight change.  Respiratory: Negative for shortness of breath.   Cardiovascular: Negative for chest pain.  Gastrointestinal: Positive for constipation. Negative for nausea, vomiting, abdominal pain and blood in stool.  Genitourinary: Negative for dysuria.  Neurological: Negative for syncope, weakness and headaches.  Psychiatric/Behavioral: Negative for confusion.       Objective:   Physical Exam  Constitutional: She appears well-developed and well-nourished.  HENT:  Mouth/Throat: Oropharynx is clear and moist.  Cardiovascular: Normal rate and regular rhythm.   Pulmonary/Chest: Effort normal and breath sounds normal. No respiratory distress. She has no wheezes. She has no rales.  Abdominal: Soft. Bowel sounds are normal. She exhibits no distension. There is no tenderness. There is no rebound and no guarding.  Musculoskeletal: She exhibits no edema.  Neurological: She is alert.  Psychiatric: She has a normal mood and affect. Her behavior is normal.          Assessment & Plan:  #1 recent fecal impaction. She has not had any history of chronic constipation but has been less active recently.  Recommend regular stool softener and supplement with MiraLax as needed. Try to increase her walking and activity levels. #2 recent hypokalemia. Eating fairly well. Recheck basic metabolic panel #3 recent leukocytosis probably related to impaction. Recheck CBC  #4 history of chronic pain syndrome. We've tapered her OxyContin significantly over the past year. Address at followup possible further tapering.

## 2011-04-12 LAB — CBC WITH DIFFERENTIAL/PLATELET
Basophils Relative: 0.1 % (ref 0.0–3.0)
Eosinophils Relative: 0.6 % (ref 0.0–5.0)
HCT: 35.5 % — ABNORMAL LOW (ref 36.0–46.0)
Hemoglobin: 12 g/dL (ref 12.0–15.0)
Lymphocytes Relative: 14.2 % (ref 12.0–46.0)
Lymphs Abs: 2.2 10*3/uL (ref 0.7–4.0)
Monocytes Relative: 4.6 % (ref 3.0–12.0)
Neutro Abs: 12.5 10*3/uL — ABNORMAL HIGH (ref 1.4–7.7)
RBC: 3.46 Mil/uL — ABNORMAL LOW (ref 3.87–5.11)
RDW: 13.3 % (ref 11.5–14.6)
WBC: 15.5 10*3/uL — ABNORMAL HIGH (ref 4.5–10.5)

## 2011-04-12 LAB — BASIC METABOLIC PANEL
GFR: 52.92 mL/min — ABNORMAL LOW (ref >60.00)
Glucose, Bld: 96 mg/dL (ref 70–99)
Potassium: 4.1 mEq/L (ref 3.5–5.1)
Sodium: 137 mEq/L (ref 135–145)

## 2011-04-17 ENCOUNTER — Other Ambulatory Visit: Payer: Self-pay | Admitting: Family Medicine

## 2011-04-18 NOTE — Telephone Encounter (Signed)
I do not see where this med has been filled in Epic yet

## 2011-04-19 ENCOUNTER — Other Ambulatory Visit: Payer: Self-pay | Admitting: Family Medicine

## 2011-04-19 ENCOUNTER — Telehealth: Payer: Self-pay | Admitting: Family Medicine

## 2011-04-19 ENCOUNTER — Ambulatory Visit: Payer: Medicare Other | Admitting: Family Medicine

## 2011-04-19 NOTE — Telephone Encounter (Signed)
Refill OK

## 2011-04-19 NOTE — Telephone Encounter (Signed)
Oxycontin 20 mg last filled 03/21/10, #60 with 0 refills

## 2011-04-19 NOTE — Telephone Encounter (Signed)
Patients daughter in law calling stating that patient needs a refill on her oxycodine 20mg . Please assist and call when available for pick up.

## 2011-04-19 NOTE — Telephone Encounter (Signed)
Pt need refill on temazepam 15 mg call into [facility] [phone]

## 2011-04-20 MED ORDER — OXYCODONE HCL 20 MG PO TB12: 20.0000 mg | ORAL_TABLET | Freq: Two times a day (BID) | ORAL | Status: DC

## 2011-04-20 NOTE — Telephone Encounter (Signed)
Pt daughter informed

## 2011-04-20 NOTE — Telephone Encounter (Signed)
Our records show this was filled on 2/5.  Daughter states pharmacy does not have.  Rx called in

## 2011-04-20 NOTE — Telephone Encounter (Signed)
Addended by: Jill Side on: 04/20/2011 09:27 AM   Modules accepted: Orders

## 2011-05-17 ENCOUNTER — Other Ambulatory Visit: Payer: Self-pay | Admitting: Family Medicine

## 2011-05-17 ENCOUNTER — Telehealth: Payer: Self-pay | Admitting: Family Medicine

## 2011-05-17 NOTE — Telephone Encounter (Signed)
Refill OK

## 2011-05-17 NOTE — Telephone Encounter (Signed)
Last filled 04/20/11, #60 with 0 refills

## 2011-05-17 NOTE — Telephone Encounter (Signed)
Patient called requesting a refill of her oxycodine. Please assist.

## 2011-05-18 MED ORDER — OXYCODONE HCL 20 MG PO TB12: 20.0000 mg | ORAL_TABLET | Freq: Two times a day (BID) | ORAL | Status: DC

## 2011-05-18 NOTE — Telephone Encounter (Signed)
Addended by: Jill Side on: 05/18/2011 11:20 AM   Modules accepted: Orders

## 2011-05-21 NOTE — Telephone Encounter (Signed)
Last filled on 04-17-11, #30 with 0 refills

## 2011-05-21 NOTE — Telephone Encounter (Signed)
Refill for 3 months. 

## 2011-05-23 ENCOUNTER — Telehealth: Payer: Self-pay | Admitting: Family Medicine

## 2011-05-23 MED ORDER — TEMAZEPAM 15 MG PO CAPS: 15.0000 mg | ORAL_CAPSULE | Freq: Every evening | ORAL | Status: DC | PRN

## 2011-05-23 NOTE — Telephone Encounter (Signed)
OK to refill for 6 months 

## 2011-05-23 NOTE — Telephone Encounter (Signed)
Last filled 04/17/11, #30 with 0 refills

## 2011-05-23 NOTE — Telephone Encounter (Signed)
Addended by: Jill Side on: 05/23/2011 05:01 PM   Modules accepted: Orders

## 2011-05-23 NOTE — Telephone Encounter (Signed)
Patient's daughter in law called stating the patient need a refill on her temazepam. Please assist.

## 2011-06-15 ENCOUNTER — Other Ambulatory Visit: Payer: Self-pay | Admitting: Family Medicine

## 2011-06-18 ENCOUNTER — Other Ambulatory Visit: Payer: Self-pay | Admitting: Family Medicine

## 2011-06-18 ENCOUNTER — Telehealth: Payer: Self-pay | Admitting: Family Medicine

## 2011-06-18 NOTE — Telephone Encounter (Signed)
We have not filled this med in Epic yet, provider on Rx is Lelan Pons, [facility]

## 2011-06-18 NOTE — Telephone Encounter (Signed)
Refill OK

## 2011-06-18 NOTE — Telephone Encounter (Signed)
Last filled #60 with 0 refills on 05/18/11

## 2011-06-18 NOTE — Telephone Encounter (Signed)
Patient called stating that she need a refill on her oxycodone. Please assist.

## 2011-06-19 MED ORDER — OXYCODONE HCL 20 MG PO TB12: 20.0000 mg | ORAL_TABLET | Freq: Two times a day (BID) | ORAL | Status: DC

## 2011-06-19 NOTE — Telephone Encounter (Signed)
Daughter Diane informed ready to pick-up

## 2011-06-19 NOTE — Telephone Encounter (Signed)
Addended by: Jill Side on: 06/19/2011 08:23 AM   Modules accepted: Orders

## 2011-06-19 NOTE — Telephone Encounter (Signed)
Refill for 6 months. 

## 2011-06-27 ENCOUNTER — Other Ambulatory Visit: Payer: Self-pay | Admitting: Family Medicine

## 2011-07-19 ENCOUNTER — Other Ambulatory Visit: Payer: Self-pay | Admitting: Family Medicine

## 2011-07-19 MED ORDER — OXYCODONE HCL 20 MG PO TB12: 20.0000 mg | ORAL_TABLET | Freq: Two times a day (BID) | ORAL | Status: DC

## 2011-07-19 NOTE — Telephone Encounter (Signed)
Pt needs refill of oxyCODONE (OXYCONTIN) 20 MG 12 hr tablet

## 2011-07-19 NOTE — Telephone Encounter (Signed)
Okay to refill? 

## 2011-07-19 NOTE — Telephone Encounter (Signed)
[name] informed Rx ready for pick-up

## 2011-08-08 ENCOUNTER — Encounter: Payer: Self-pay | Admitting: Family Medicine

## 2011-08-08 ENCOUNTER — Other Ambulatory Visit: Payer: Self-pay | Admitting: Family Medicine

## 2011-08-08 ENCOUNTER — Ambulatory Visit (INDEPENDENT_AMBULATORY_CARE_PROVIDER_SITE_OTHER): Payer: Medicare Other | Admitting: Family Medicine

## 2011-08-08 VITALS — BP 110/62 | HR 68 | Temp 98.2°F | Resp 16 | Wt 116.5 lb

## 2011-08-08 DIAGNOSIS — R634 Abnormal weight loss: Secondary | ICD-10-CM

## 2011-08-08 DIAGNOSIS — F0390 Unspecified dementia without behavioral disturbance: Secondary | ICD-10-CM

## 2011-08-08 DIAGNOSIS — I10 Essential (primary) hypertension: Secondary | ICD-10-CM | POA: Diagnosis not present

## 2011-08-08 DIAGNOSIS — M810 Age-related osteoporosis without current pathological fracture: Secondary | ICD-10-CM | POA: Diagnosis not present

## 2011-08-08 MED ORDER — MEMANTINE HCL 5 MG PO TABS: 5.0000 mg | ORAL_TABLET | Freq: Two times a day (BID) | ORAL | Status: DC

## 2011-08-08 NOTE — Patient Instructions (Signed)
Hold Boniva for now Consider reduce Citalopram to one half tablet daily

## 2011-08-08 NOTE — Progress Notes (Signed)
  Subjective:    Patient ID: Heather Jarvis, female    DOB: 1928-09-11, 78 y.o.   MRN: [id number]  HPI  Patient seen for medical followup. She has history of hypertension, hyperlipidemia, osteoporosis, cognitive impairment with prior history of delusional disorder. History of depression. She's had progressive decline and decreased initiative over the past several months. She was at one point taking Aricept but this seemed to decrease her appetite and family discontinued. She rarely gets out of bed. She's had loss of muscle mass and has grown weaker over past few months. She's lost about 16 pounds.  She had hospital visit for fecal impaction several months ago. Family giving MiraLax once a week which works well. She had lab work including thyroid functions previously which were normal.  Patient's had previous CT of head which showed age-related atrophy and changes consistent with small vessel disease.  No wandering behaviors. No agitation. Good appetite. is eating regularly.  Past Medical History  Diagnosis Date  . Arthritis   . Hyperlipidemia   . Hypertension   . Osteoporosis   . Sick sinus syndrome    Past Surgical History  Procedure Date  . Appendectomy   . Vesicovaginal fistula closure w/ tah   . Tonsillectomy   . Abdominal hysterectomy     reports that she has never smoked. She quit smokeless tobacco use about 42 years ago. She reports that she does not drink alcohol or use illicit drugs. family history includes Diabetes in her mother; Heart disease in her brother and mother; and Hypertension in her mother and sister. No Known Allergies    Review of Systems  Constitutional: Positive for unexpected weight change. Negative for appetite change.  Respiratory: Negative for cough and shortness of breath.   Cardiovascular: Negative for chest pain.  Gastrointestinal: Negative for abdominal pain.  Genitourinary: Negative for dysuria.  Neurological: Positive for weakness (Generalized).  Negative for dizziness.       Objective:   Physical Exam  Constitutional: She appears well-developed and well-nourished.  HENT:  Right Ear: External ear normal.  Left Ear: External ear normal.  Mouth/Throat: Oropharynx is clear and moist.  Neck: Neck supple. No thyromegaly present.  Cardiovascular: Normal rate and regular rhythm.   Pulmonary/Chest: Effort normal and breath sounds normal. No respiratory distress. She has no wheezes. She has no rales.  Musculoskeletal: She exhibits no edema.  Lymphadenopathy:    She has no cervical adenopathy.  Neurological: She is alert. She has normal reflexes. No cranial nerve deficit.       Patient is alert and cooperative. She is oriented two-year but not date and not month  Skin: No rash noted.  Psychiatric: She has a normal mood and affect. Her behavior is normal.          Assessment & Plan:  #1 cognitive impairment with dementia. Progressive decline. Previous decreased appetite with Aricept. Discussed trial Namenda 5 mg twice daily and reassess one month.  Discussed possible home physical therapy which they will consider #2 weight loss. Recent labs including thyroid function normal. Discussed with daughter-in-law possible further evaluation. Given her age and progressive mental decline and they are not interested in further aggressive workup at this time. #3 history of depression currently stable. Reduce Celexa one half tablet daily  #4 history of osteoporosis. She has been on bisphosphonates for several years. Hold Boniva for now

## 2011-08-17 ENCOUNTER — Other Ambulatory Visit: Payer: Self-pay | Admitting: Family Medicine

## 2011-08-17 MED ORDER — OXYCODONE HCL 20 MG PO TB12: 20.0000 mg | ORAL_TABLET | Freq: Two times a day (BID) | ORAL | Status: DC

## 2011-08-17 NOTE — Telephone Encounter (Signed)
Daughter informed Rx ready to pick-up

## 2011-08-17 NOTE — Telephone Encounter (Signed)
Okay to refill? 

## 2011-08-17 NOTE — Telephone Encounter (Signed)
Pt needs new rx oxycodone 20mg 

## 2011-09-17 ENCOUNTER — Telehealth: Payer: Self-pay | Admitting: Family Medicine

## 2011-09-17 ENCOUNTER — Other Ambulatory Visit: Payer: Self-pay | Admitting: *Deleted

## 2011-09-17 MED ORDER — OXYCODONE HCL 20 MG PO TB12: 20.0000 mg | ORAL_TABLET | Freq: Two times a day (BID) | ORAL | Status: DC

## 2011-09-17 NOTE — Telephone Encounter (Signed)
OK  30 days

## 2011-09-17 NOTE — Telephone Encounter (Signed)
Pt daughter informed ready to pick-up

## 2011-09-17 NOTE — Telephone Encounter (Signed)
2nd Rx printed, Dr Elease Hashimoto out of office, Rx needs Dr Burnice Logan  name and sig

## 2011-09-17 NOTE — Telephone Encounter (Signed)
Oxycontin 20 mg every 12 hours last filled by Dr Elease Hashimoto on 08-17-11, Dr Elease Hashimoto out of the office this week.

## 2011-09-17 NOTE — Telephone Encounter (Signed)
Requesting refill on oxyCODONE (OXYCONTIN) 20 MG 12 hr tablet

## 2011-09-24 ENCOUNTER — Ambulatory Visit: Payer: Medicare Other | Admitting: Family Medicine

## 2011-10-13 ENCOUNTER — Other Ambulatory Visit: Payer: Self-pay | Admitting: Family Medicine

## 2011-10-16 ENCOUNTER — Other Ambulatory Visit: Payer: Self-pay | Admitting: Family Medicine

## 2011-10-18 ENCOUNTER — Telehealth: Payer: Self-pay | Admitting: Family Medicine

## 2011-10-18 NOTE — Telephone Encounter (Signed)
Pt needs refill of oxyCODONE (OXYCONTIN) 20 MG 12 hr tablet

## 2011-10-18 NOTE — Telephone Encounter (Signed)
Refill OK

## 2011-10-18 NOTE — Telephone Encounter (Signed)
Oxycodone last filled 09/17/11, #120 with 0 refills

## 2011-10-19 MED ORDER — OXYCODONE HCL 20 MG PO TB12: 20.0000 mg | ORAL_TABLET | Freq: Two times a day (BID) | ORAL | Status: DC

## 2011-10-19 NOTE — Telephone Encounter (Signed)
Heather Jarvis informed Rx ready to pick-up

## 2011-10-22 ENCOUNTER — Encounter: Payer: Self-pay | Admitting: Family Medicine

## 2011-10-22 ENCOUNTER — Ambulatory Visit (INDEPENDENT_AMBULATORY_CARE_PROVIDER_SITE_OTHER): Payer: Medicare Other | Admitting: Family Medicine

## 2011-10-22 VITALS — BP 90/60 | Temp 98.5°F | Wt 117.0 lb

## 2011-10-22 DIAGNOSIS — D72829 Elevated white blood cell count, unspecified: Secondary | ICD-10-CM

## 2011-10-22 DIAGNOSIS — F341 Dysthymic disorder: Secondary | ICD-10-CM

## 2011-10-22 DIAGNOSIS — F0390 Unspecified dementia without behavioral disturbance: Secondary | ICD-10-CM

## 2011-10-22 DIAGNOSIS — K5900 Constipation, unspecified: Secondary | ICD-10-CM | POA: Diagnosis not present

## 2011-10-22 DIAGNOSIS — I10 Essential (primary) hypertension: Secondary | ICD-10-CM | POA: Diagnosis not present

## 2011-10-22 DIAGNOSIS — G894 Chronic pain syndrome: Secondary | ICD-10-CM

## 2011-10-22 MED ORDER — MEMANTINE HCL 5 MG PO TABS: ORAL_TABLET | ORAL | Status: DC

## 2011-10-22 NOTE — Progress Notes (Signed)
  Subjective:    Patient ID: Heather Jarvis, female    DOB: [birth year], 78 y.o.   MRN: [id number]  HPI  Medical followup. Patient has history of hypertension, hyperlipidemia, osteoporosis, past history of acute renal failure, dementia, delusional disorder, history of chronic leukocytosis, and past history of congestive heart failure. Seen today for the following issues  Dementia. No agitation. We started Namenda 5 mg and should be twice daily but they're only giving  once daily. Family thinks they have seen some improvement even with low-dose once daily. They have reduced her Celexa down to one half tablet which is 10 mg daily like to discontinue. Mood appears to be stable. Sleeping well. Appetite is stable and she's actually up 1 pound from last visit.  Chronic low back pain. On Oxycontin for several years but because of cost like to consider going back to immediate release Oxycodone.  Past Medical History  Diagnosis Date  . Arthritis   . Hyperlipidemia   . Hypertension   . Osteoporosis   . Sick sinus syndrome    Past Surgical History  Procedure Date  . Appendectomy   . Vesicovaginal fistula closure w/ tah   . Tonsillectomy   . Abdominal hysterectomy     reports that she has never smoked. She quit smokeless tobacco use about 42 years ago. She reports that she does not drink alcohol or use illicit drugs. family history includes Diabetes in her mother; Heart disease in her brother and mother; and Hypertension in her mother and sister. No Known Allergies   Review of Systems  Constitutional: Negative for fever, chills, appetite change, fatigue and unexpected weight change.  Respiratory: Negative for shortness of breath.   Cardiovascular: Negative for chest pain.  Gastrointestinal: Negative for abdominal pain and constipation.  Genitourinary: Negative for dysuria.  Musculoskeletal: Positive for back pain.       Objective:   Physical Exam  Constitutional: She appears well-developed  and well-nourished.  HENT:  Mouth/Throat: Oropharynx is clear and moist.  Neck: Neck supple. No thyromegaly present.  Cardiovascular: Normal rate and regular rhythm.   Pulmonary/Chest: Effort normal and breath sounds normal. No respiratory distress. She has no wheezes. She has no rales.  Musculoskeletal: She exhibits no edema.  Lymphadenopathy:    She has no cervical adenopathy.  Neurological: She is alert.          Assessment & Plan:  #1 history of depression. Stable. Discontinue Celexa this time. Consider Remeron if she has any recurrent depression symptoms  #2 chronic pain related to chronic back pain. Discontinue OxyContin-secondary to cost issues.. Percocet 10/325 mg 1 every 8 hours for pain. Hopefully we can taper this down with time if possible  #3 intermittent constipation. Continue stool softener. Continue as needed MiraLax #4 history of hypertension. Stable. Recheck basic metabolic panel. #5 chronic leukocytosis.  ?CLL. Have discussed with family and they are not interested in pursuing further hematology evaluation at this time if WBC stable. #6 Dementia.  Titrate Namenda to target dose of 10 mg bid.

## 2011-10-25 ENCOUNTER — Encounter: Payer: Self-pay | Admitting: *Deleted

## 2011-10-25 DIAGNOSIS — Z950 Presence of cardiac pacemaker: Secondary | ICD-10-CM | POA: Insufficient documentation

## 2011-11-02 ENCOUNTER — Encounter: Payer: Medicare Other | Admitting: Internal Medicine

## 2011-11-16 ENCOUNTER — Other Ambulatory Visit: Payer: Self-pay | Admitting: Family Medicine

## 2011-11-16 MED ORDER — OXYCODONE-ACETAMINOPHEN 10-325 MG PO TABS: 1.0000 | ORAL_TABLET | Freq: Three times a day (TID) | ORAL | Status: DC | PRN

## 2011-11-16 NOTE — Telephone Encounter (Signed)
[name] informed Rx ready to pick-up, she sill pick up Monday

## 2011-11-16 NOTE — Telephone Encounter (Signed)
Oxycontin was D/C at [facility] in August due to cost issues.  Percocet 10-325, one tab every 8 hours for pain has not been filled yet, entered into chart at [facility] only. Oxycontin was last filled on 10/19/11

## 2011-11-16 NOTE — Telephone Encounter (Signed)
May refill Percocet #90

## 2011-11-16 NOTE — Telephone Encounter (Signed)
Pt needs new rx percocet

## 2011-12-11 ENCOUNTER — Ambulatory Visit (INDEPENDENT_AMBULATORY_CARE_PROVIDER_SITE_OTHER): Payer: Medicare Other | Admitting: Internal Medicine

## 2011-12-11 ENCOUNTER — Encounter: Payer: Self-pay | Admitting: Internal Medicine

## 2011-12-11 VITALS — BP 113/49 | HR 60 | Ht 60.0 in | Wt 120.0 lb

## 2011-12-11 DIAGNOSIS — Z950 Presence of cardiac pacemaker: Secondary | ICD-10-CM

## 2011-12-11 DIAGNOSIS — I10 Essential (primary) hypertension: Secondary | ICD-10-CM

## 2011-12-11 LAB — PACEMAKER DEVICE OBSERVATION
AL IMPEDENCE PM: 389 Ohm
ATRIAL PACING PM: 76
BAMS-0001: 180 {beats}/min
BAMS-0003: 80 {beats}/min
BATTERY VOLTAGE: 2.66 V
VENTRICULAR PACING PM: 1

## 2011-12-11 NOTE — Assessment & Plan Note (Signed)
Her blood pressure is well controlled. She will continue her current medical therapy.

## 2011-12-11 NOTE — Progress Notes (Signed)
HPI [name] returns today for followup. She is a very pleasant elderly woman with symptomatic bradycardia, status post permanent pacemaker insertion. She is a history of dementia, hypertension, and difficulty sleeping. Her daughter who is with her today notes that she continues to be bothered by memory loss. No Known Allergies   Current Outpatient Prescriptions  Medication Sig Dispense Refill  . acetaminophen (TYLENOL) 325 MG tablet Take 2 tablets (650 mg total) by mouth every 6 (six) hours as needed (or Fever >/= 101).      Marland Kitchen aspirin 81 MG tablet Take 81 mg by mouth daily.       . calcium-vitamin D (OSCAL WITH D) 500-200 MG-UNIT per tablet Take 1 tablet by mouth 2 (two) times daily.       . carvedilol (COREG) 12.5 MG tablet take 1 tablet by mouth twice a day  60 tablet  11  . ergocalciferol (VITAMIN D2) 50000 UNITS capsule Take 50,000 Units by mouth once a week. On Fridays      . haloperidol (HALDOL) 1 MG tablet take 1 tablet by mouth at bedtime  30 tablet  5  . hydrALAZINE (APRESOLINE) 25 MG tablet take 1 tablet by mouth twice a day  120 tablet  0  . ibandronate (BONIVA) 150 MG tablet Take 150 mg by mouth every 30 (thirty) days. Take in the morning with a full glass of water, on an empty stomach, and do not take anything else by mouth or lie down for the next 30 min. (take on the 6th day of each month)      . lisinopril-hydrochlorothiazide (PRINZIDE,ZESTORETIC) 20-12.5 MG per tablet take 1 tablet by mouth once daily  90 tablet  3  . memantine (NAMENDA) 5 MG tablet One twice daily for one week then titrate as instructed to two twice daily  120 tablet  11  . Multiple Vitamin (MULITIVITAMIN WITH MINERALS) TABS Take 1 tablet by mouth daily. Centrum silver      . oxyCODONE-acetaminophen (PERCOCET) 10-325 MG per tablet Take 1 tablet by mouth every 8 (eight) hours as needed.  90 tablet  0  . polyethylene glycol powder (GLYCOLAX/MIRALAX) powder Take 17 g by mouth daily as needed.       . potassium  chloride (K-DUR) 10 MEQ tablet Take 2 tablets (20 mEq total) by mouth daily.  4 tablet  0  . temazepam (RESTORIL) 15 MG capsule Take 1 capsule (15 mg total) by mouth at bedtime as needed for sleep.  30 capsule  5     Past Medical History  Diagnosis Date  . Arthritis   . Hyperlipidemia   . Hypertension   . Osteoporosis   . Sick sinus syndrome     ROS:   All systems reviewed and negative except as noted in the HPI.   Past Surgical History  Procedure Date  . Appendectomy   . Vesicovaginal fistula closure w/ tah   . Tonsillectomy   . Abdominal hysterectomy      Family History  Problem Relation Age of Onset  . Hypertension Mother   . Heart disease Mother   . Diabetes Mother   . Hypertension Sister   . Heart disease Brother      History   Social History  . Marital Status: Widowed    Spouse Name: N/A    Number of Children: N/A  . Years of Education: N/A   Occupational History  . Not on file.   Social History Main Topics  . Smoking status:  Never Smoker   . Smokeless tobacco: Former Systems developer    Quit date: 04/24/1969  . Alcohol Use: No  . Drug Use: No  . Sexually Active: Not on file   Other Topics Concern  . Not on file   Social History Narrative  . No narrative on file     BP 113/49  Pulse 60  Ht 5' (1.524 m)  Wt 120 lb (54.432 kg)  BMI 23.44 kg/m2  SpO2 99%  Physical Exam:  Well appearing totally woman, NAD HEENT: Unremarkable Neck:  No JVD, no thyromegally Lungs:  Clear with no wheezes, rales, or rhonchi. HEART:  Regular rate rhythm, no murmurs, no rubs, no clicks Abd:  soft, positive bowel sounds, no organomegally, no rebound, no guarding Ext:  2 plus pulses, no edema, no cyanosis, no clubbing Skin:  No rashes no nodules Neuro:  CN II through XII intact, motor grossly intact   DEVICE  Normal device function.  See PaceArt for details.   Assess/Plan:

## 2011-12-11 NOTE — Assessment & Plan Note (Signed)
Her St. Jude dual-chamber pacemaker is prematurely approaching elective replacement. We'll have her come back on a monthly basis for more frequent checks.

## 2011-12-11 NOTE — Patient Instructions (Addendum)
Your physician recommends that you schedule a follow-up appointment in: 54 month with Jerene Pitch, [place]  Your physician wants you to follow-up in: 6 months with Dr Knox Saliva will receive a reminder letter in the mail two months in advance. If you don't receive a letter, please call our office to schedule the follow-up appointment.

## 2011-12-18 ENCOUNTER — Telehealth: Payer: Self-pay | Admitting: Family Medicine

## 2011-12-18 MED ORDER — OXYCODONE-ACETAMINOPHEN 10-325 MG PO TABS: 1.0000 | ORAL_TABLET | Freq: Three times a day (TID) | ORAL | Status: DC | PRN

## 2011-12-18 NOTE — Telephone Encounter (Signed)
Daughter informed of Dr Erick Blinks recommendation, she voiced understanding.  Rx printed, ready to be signed and picked up

## 2011-12-18 NOTE — Telephone Encounter (Signed)
Pt currently taking med every 8 hours, daughter thinks she now needs 4 daily.  Last filled 11-16-11, #90 with 0 refills

## 2011-12-18 NOTE — Telephone Encounter (Signed)
At her age I am reluctant to increase.  Would like to consider other options if pain not well controlled.  May refill for one month.

## 2011-12-18 NOTE — Telephone Encounter (Signed)
Pt needs refill of oxyCODONE-acetaminophen (PERCOCET) 10-325 MG per tablet. Pts daughter said that she believes that pt is going to need to take 4 per day.

## 2011-12-26 ENCOUNTER — Other Ambulatory Visit: Payer: Self-pay | Admitting: Family Medicine

## 2011-12-26 ENCOUNTER — Encounter: Payer: Self-pay | Admitting: Internal Medicine

## 2012-01-11 ENCOUNTER — Encounter: Payer: Self-pay | Admitting: Cardiology

## 2012-01-11 ENCOUNTER — Ambulatory Visit (INDEPENDENT_AMBULATORY_CARE_PROVIDER_SITE_OTHER): Payer: Medicare Other | Admitting: Cardiology

## 2012-01-11 ENCOUNTER — Encounter: Payer: Self-pay | Admitting: *Deleted

## 2012-01-11 VITALS — BP 145/65 | HR 55 | Ht 60.0 in | Wt 122.0 lb

## 2012-01-11 DIAGNOSIS — I498 Other specified cardiac arrhythmias: Secondary | ICD-10-CM

## 2012-01-11 DIAGNOSIS — I10 Essential (primary) hypertension: Secondary | ICD-10-CM

## 2012-01-11 DIAGNOSIS — F341 Dysthymic disorder: Secondary | ICD-10-CM

## 2012-01-11 DIAGNOSIS — Z79899 Other long term (current) drug therapy: Secondary | ICD-10-CM

## 2012-01-11 DIAGNOSIS — Z950 Presence of cardiac pacemaker: Secondary | ICD-10-CM | POA: Diagnosis not present

## 2012-01-11 DIAGNOSIS — I499 Cardiac arrhythmia, unspecified: Secondary | ICD-10-CM

## 2012-01-11 LAB — PACEMAKER DEVICE OBSERVATION: BAMS-0003: 80 {beats}/min

## 2012-01-11 NOTE — Patient Instructions (Signed)
See instruction sheet for pacemaker generator change.

## 2012-01-11 NOTE — Progress Notes (Signed)
ELECTROPHYSIOLOGY OFFICE NOTE  Patient ID: Heather Jarvis MRN: [id number], DOB/AGE: [birth year]   Date of Visit: 01/11/2012  Primary Physician: Eulas Post, MD Primary EP: Lovena Le, MD  Reason for Visit: Device follow-up; battery at [facility]  History of Present Illness Heather Jarvis is a pleasant 78 year old woman with symptomatic bradycardia/SSS s/p PPM, HTN and dementia who presents today for device follow-up. Her battery is at [facility]. She is accompanied by her daughter. She has no complaints today. She denies CP, SOB, palpitations, dizziness or syncope.  Past Medical History Past Medical History  Diagnosis Date  . Arthritis   . Hyperlipidemia   . Hypertension   . Osteoporosis   . Sick sinus syndrome     Past Surgical History Past Surgical History  Procedure Date  . Appendectomy   . Vesicovaginal fistula closure w/ tah   . Tonsillectomy   . Abdominal hysterectomy      Allergies/Intolerances No Known Allergies  Current Home Medications Current Outpatient Prescriptions  Medication Sig Dispense Refill  . acetaminophen (TYLENOL) 325 MG tablet Take 2 tablets (650 mg total) by mouth every 6 (six) hours as needed (or Fever >/= 101).      Marland Kitchen aspirin 81 MG tablet Take 81 mg by mouth daily.       . calcium-vitamin D (OSCAL WITH D) 500-200 MG-UNIT per tablet Take 1 tablet by mouth 2 (two) times daily.       . carvedilol (COREG) 12.5 MG tablet take 1 tablet by mouth twice a day  60 tablet  11  . ergocalciferol (VITAMIN D2) 50000 UNITS capsule Take 50,000 Units by mouth once a week. On Fridays      . haloperidol (HALDOL) 1 MG tablet take 1 tablet by mouth at bedtime  30 tablet  5  . hydrALAZINE (APRESOLINE) 25 MG tablet take 1 tablet by mouth twice a day  120 tablet  0  . ibandronate (BONIVA) 150 MG tablet Take 150 mg by mouth every 30 (thirty) days. Take in the morning with a full glass of water, on an empty stomach, and do not take anything else by mouth or lie down for the next 30 min. (take on  the 6th day of each month)      . lisinopril-hydrochlorothiazide (PRINZIDE,ZESTORETIC) 20-12.5 MG per tablet take 1 tablet by mouth once daily  90 tablet  3  . memantine (NAMENDA) 5 MG tablet One twice daily for one week then titrate as instructed to two twice daily  120 tablet  11  . Multiple Vitamin (MULITIVITAMIN WITH MINERALS) TABS Take 1 tablet by mouth daily. Centrum silver      . oxyCODONE-acetaminophen (PERCOCET) 10-325 MG per tablet Take 1 tablet by mouth every 8 (eight) hours as needed.  90 tablet  0  . polyethylene glycol powder (GLYCOLAX/MIRALAX) powder Take 17 g by mouth daily as needed.       . potassium chloride (K-DUR) 10 MEQ tablet Take 2 tablets (20 mEq total) by mouth daily.  4 tablet  0  . temazepam (RESTORIL) 15 MG capsule Take 1 capsule (15 mg total) by mouth at bedtime as needed for sleep.  30 capsule  5    Social History Social History  . Marital Status: Widowed   Social History Main Topics  . Smoking status: Never Smoker   . Smokeless tobacco: Former Systems developer    Quit date: 04/24/1969  . Alcohol Use: No  . Drug Use: No   Review of Systems General: No chills, fever,  night sweats or weight changes Cardiovascular: No chest pain, dyspnea on exertion, edema, orthopnea, palpitations, paroxysmal nocturnal dyspnea Dermatological: No rash, lesions or masses Respiratory: No cough, dyspnea Urologic: No hematuria, dysuria Abdominal: No nausea, vomiting, diarrhea, bright red blood per rectum, melena, or hematemesis Neurologic: No visual changes, weakness, changes in mental status All other systems reviewed and are otherwise negative except as noted above.  Physical Exam Blood pressure 145/65, pulse 55, height 5' (1.524 m), weight 122 lb (55.339 kg).  General: Well developed, well appearing, elderly 78 year old in no acute distress. HEENT: Normocephalic, atraumatic. EOMs intact. Sclera nonicteric. Oropharynx clear.  Neck: Supple. No JVD. Lungs: Respirations regular and  unlabored, CTA bilaterally. No wheezes, rales or rhonchi. Heart: RRR. S1, S2 present. II/VI systolic murmur at apex. No diastolic murmur, rub, S3 or S4. Abdomen: Appears soft, non-distended.  Extremities: No clubbing, cyanosis or edema.  Psych: Normal affect. Musculoskeletal: No kyphosis. Neuro: Alert and oriented X 3. Moves all extremities spontaneously.   Diagnostics Device interrogation shows PPM battery at ERI; otherwise, normal PPM function with stable lead parameters/measurements; no programming changes made; see PaceArt report  Assessment and Plan 1. PPM battery at [facility] I reviewed the indications for PPM generator change with [name] and her daughter. I explained the procedure, including risks and benefits. These risks include, but are not limited to, bleeding and/or infection. Heather Jarvis and her daughter expressed verbal understanding and agree with this plan of care. She is scheduled for PPM generator change on 02/04/2012 with Dr. Lovena Le.   Signed, Yaresly Menzel, Jerene Pitch, PA-C 01/11/2012, 1:32 PM

## 2012-01-21 ENCOUNTER — Telehealth: Payer: Self-pay | Admitting: Family Medicine

## 2012-01-21 ENCOUNTER — Encounter (HOSPITAL_COMMUNITY): Payer: Self-pay | Admitting: Pharmacy Technician

## 2012-01-21 MED ORDER — OXYCODONE-ACETAMINOPHEN 10-325 MG PO TABS: 1.0000 | ORAL_TABLET | Freq: Three times a day (TID) | ORAL | Status: DC

## 2012-01-21 NOTE — Telephone Encounter (Signed)
Oxycodone last filled 12-18-11, #90 with 0 refills

## 2012-01-21 NOTE — Telephone Encounter (Signed)
Pts daughter called and said that pt needs refill oxyCODONE-acetaminophen (PERCOCET) 10-325 MG per tablet. Pt completely out of med.

## 2012-01-21 NOTE — Telephone Encounter (Signed)
Refill OK

## 2012-01-21 NOTE — Telephone Encounter (Signed)
Pt daughter informed ready to pick-up

## 2012-01-25 ENCOUNTER — Encounter: Payer: Self-pay | Admitting: Cardiology

## 2012-01-28 ENCOUNTER — Other Ambulatory Visit: Payer: Medicare Other

## 2012-01-30 ENCOUNTER — Other Ambulatory Visit (INDEPENDENT_AMBULATORY_CARE_PROVIDER_SITE_OTHER): Payer: Medicare Other

## 2012-01-30 DIAGNOSIS — I10 Essential (primary) hypertension: Secondary | ICD-10-CM | POA: Diagnosis not present

## 2012-01-30 DIAGNOSIS — I499 Cardiac arrhythmia, unspecified: Secondary | ICD-10-CM | POA: Diagnosis not present

## 2012-01-30 DIAGNOSIS — Z79899 Other long term (current) drug therapy: Secondary | ICD-10-CM

## 2012-01-30 DIAGNOSIS — I498 Other specified cardiac arrhythmias: Secondary | ICD-10-CM

## 2012-01-30 DIAGNOSIS — F341 Dysthymic disorder: Secondary | ICD-10-CM

## 2012-01-30 DIAGNOSIS — Z950 Presence of cardiac pacemaker: Secondary | ICD-10-CM

## 2012-01-30 LAB — CBC WITH DIFFERENTIAL/PLATELET
Eosinophils Relative: 0.9 % (ref 0.0–5.0)
Lymphocytes Relative: 28.5 % (ref 12.0–46.0)
Monocytes Relative: 7.3 % (ref 3.0–12.0)
Neutrophils Relative %: 63 % (ref 43.0–77.0)
Platelets: 242 10*3/uL (ref 150.0–400.0)
WBC: 11.5 10*3/uL — ABNORMAL HIGH (ref 4.5–10.5)

## 2012-01-30 LAB — BASIC METABOLIC PANEL
CO2: 29 mEq/L (ref 19–32)
Glucose, Bld: 98 mg/dL (ref 70–99)
Potassium: 3.7 mEq/L (ref 3.5–5.1)
Sodium: 139 mEq/L (ref 135–145)

## 2012-02-03 DIAGNOSIS — Z45018 Encounter for adjustment and management of other part of cardiac pacemaker: Secondary | ICD-10-CM | POA: Diagnosis not present

## 2012-02-03 DIAGNOSIS — M129 Arthropathy, unspecified: Secondary | ICD-10-CM | POA: Diagnosis not present

## 2012-02-03 DIAGNOSIS — I10 Essential (primary) hypertension: Secondary | ICD-10-CM | POA: Diagnosis not present

## 2012-02-03 DIAGNOSIS — E785 Hyperlipidemia, unspecified: Secondary | ICD-10-CM | POA: Diagnosis not present

## 2012-02-03 DIAGNOSIS — F0390 Unspecified dementia without behavioral disturbance: Secondary | ICD-10-CM | POA: Diagnosis not present

## 2012-02-03 DIAGNOSIS — Z79899 Other long term (current) drug therapy: Secondary | ICD-10-CM | POA: Diagnosis not present

## 2012-02-03 DIAGNOSIS — Z7982 Long term (current) use of aspirin: Secondary | ICD-10-CM | POA: Diagnosis not present

## 2012-02-03 DIAGNOSIS — M810 Age-related osteoporosis without current pathological fracture: Secondary | ICD-10-CM | POA: Diagnosis not present

## 2012-02-03 MED ORDER — CEFAZOLIN SODIUM-DEXTROSE 2-3 GM-% IV SOLR
2.0000 g | INTRAVENOUS | Status: DC
  Filled 2012-02-03: qty 50

## 2012-02-03 MED ORDER — SODIUM CHLORIDE 0.9 % IR SOLN
80.0000 mg | Status: DC
  Filled 2012-02-03: qty 2

## 2012-02-04 ENCOUNTER — Other Ambulatory Visit: Payer: Self-pay

## 2012-02-04 ENCOUNTER — Ambulatory Visit (HOSPITAL_COMMUNITY)
Admission: RE | Admit: 2012-02-04 | Discharge: 2012-02-04 | Disposition: A | Discharge status: Home / Self Care | Payer: Medicare Other | Source: Ambulatory Visit | Attending: Internal Medicine | Admitting: Internal Medicine

## 2012-02-04 ENCOUNTER — Encounter (HOSPITAL_COMMUNITY)
Admission: RE | Disposition: A | Discharge status: Home / Self Care | Payer: Self-pay | Source: Ambulatory Visit | Attending: Internal Medicine

## 2012-02-04 DIAGNOSIS — E785 Hyperlipidemia, unspecified: Secondary | ICD-10-CM | POA: Insufficient documentation

## 2012-02-04 DIAGNOSIS — Z79899 Other long term (current) drug therapy: Secondary | ICD-10-CM | POA: Diagnosis not present

## 2012-02-04 DIAGNOSIS — I10 Essential (primary) hypertension: Secondary | ICD-10-CM | POA: Diagnosis not present

## 2012-02-04 DIAGNOSIS — Z7982 Long term (current) use of aspirin: Secondary | ICD-10-CM | POA: Insufficient documentation

## 2012-02-04 DIAGNOSIS — M129 Arthropathy, unspecified: Secondary | ICD-10-CM | POA: Insufficient documentation

## 2012-02-04 DIAGNOSIS — F0390 Unspecified dementia without behavioral disturbance: Secondary | ICD-10-CM | POA: Insufficient documentation

## 2012-02-04 DIAGNOSIS — Z45018 Encounter for adjustment and management of other part of cardiac pacemaker: Secondary | ICD-10-CM | POA: Insufficient documentation

## 2012-02-04 DIAGNOSIS — M810 Age-related osteoporosis without current pathological fracture: Secondary | ICD-10-CM | POA: Insufficient documentation

## 2012-02-04 DIAGNOSIS — I498 Other specified cardiac arrhythmias: Secondary | ICD-10-CM | POA: Diagnosis not present

## 2012-02-04 HISTORY — PX: PACEMAKER GENERATOR CHANGE: SHX5998

## 2012-02-04 HISTORY — PX: PACEMAKER GENERATOR CHANGE: SHX5481

## 2012-02-04 LAB — SURGICAL PCR SCREEN
MRSA, PCR: POSITIVE — AB
Staphylococcus aureus: POSITIVE — AB

## 2012-02-04 SURGERY — PACEMAKER GENERATOR CHANGE
Anesthesia: LOCAL

## 2012-02-04 MED ORDER — MIDAZOLAM HCL 5 MG/5ML IJ SOLN
INTRAMUSCULAR | Status: AC
  Filled 2012-02-04: qty 5

## 2012-02-04 MED ORDER — CHLORHEXIDINE GLUCONATE 4 % EX LIQD
60.0000 mL | Freq: Once | CUTANEOUS | Status: DC
  Filled 2012-02-04: qty 60

## 2012-02-04 MED ORDER — CEFAZOLIN SODIUM-DEXTROSE 2-3 GM-% IV SOLR
INTRAVENOUS | Status: AC
  Filled 2012-02-04: qty 50

## 2012-02-04 MED ORDER — FENTANYL CITRATE 0.05 MG/ML IJ SOLN
INTRAMUSCULAR | Status: AC
  Filled 2012-02-04: qty 2

## 2012-02-04 MED ORDER — LIDOCAINE HCL (PF) 1 % IJ SOLN
INTRAMUSCULAR | Status: AC
  Filled 2012-02-04: qty 60

## 2012-02-04 MED ORDER — MUPIROCIN 2 % EX OINT
TOPICAL_OINTMENT | Freq: Two times a day (BID) | CUTANEOUS | Status: DC
  Administered 2012-02-04: 1 via NASAL
  Filled 2012-02-04 (×2): qty 22

## 2012-02-04 MED ORDER — SODIUM CHLORIDE 0.45 % IV SOLN
INTRAVENOUS | Status: DC
  Administered 2012-02-04: 08:00:00 via INTRAVENOUS

## 2012-02-04 NOTE — Op Note (Signed)
EP Procedure Note  Procedure: Pacemaker generator removal, insertion of a new dual-chamber pacemaker, and pacemaker pocket revision.  Indication: Symptomatic bradycardia, status post pacemaker insertion, with pacemaker at elective replacement.  1. Introduction - the patient is a very pleasant 78 year old woman with symptomatic bradycardia status post pacemaker insertion, who has reached elective replacement, and presents for pacemaker removal and insertion of a new device.  2. Procedure - after informed consent was obtained, the patient was prepped and draped in a sterile fashion. 30 cc of lidocaine was infiltrated around the site of the old pacemaker. A 5 cm incision was made over the pacemaker insertion site. Electrocautery was utilized to dissect down to the fascial plane. The pacemaker pocket was entered. The pacemaker generator was removed. The leads were examined and found to be working satisfactorily. The new St. Jude dual-chamber pacemaker, serial number [id number] was connected to the old pacing leads. The pocket was revised to accommodate the larger pacemaker. The new device was placed back the subcutaneous pocket. The pocket was irrigated with antibiotic irrigation. Electrocautery was utilized for hemostasis. The incision was closed with 2-0 and 3-0 Vicryl suture. Benzoin and Steri-Strips were placed on the skin. A pressure dressing was placed. The patient was returned to her room in satisfactory condition.  3. Complications - there are no immediate procedural complications.  4. Results - this demonstrates successful removal of a previous implanted St. Jude dual-chamber pacemaker and insertion of a new St. Jude dual-chamber pacemaker without immediate procedural complication.  Mikle Bosworth.D.

## 2012-02-04 NOTE — H&P (Signed)
HPI  [name] returns today for PPM generator removal and insertion of a new device. She is a very pleasant elderly woman with symptomatic bradycardia, status post permanent pacemaker insertion. She is a history of dementia, hypertension, and difficulty sleeping. Her daughter who is with her today notes that she continues to be bothered by memory loss. No Known Allergies . She reached ERI several weeks ago. Current Outpatient Prescriptions   Medication  Sig  Dispense  Refill   .  acetaminophen (TYLENOL) 325 MG tablet  Take 2 tablets (650 mg total) by mouth every 6 (six) hours as needed (or Fever >/= 101).     Marland Kitchen  aspirin 81 MG tablet  Take 81 mg by mouth daily.     .  calcium-vitamin D (OSCAL WITH D) 500-200 MG-UNIT per tablet  Take 1 tablet by mouth 2 (two) times daily.     .  carvedilol (COREG) 12.5 MG tablet  take 1 tablet by mouth twice a day  60 tablet  11   .  ergocalciferol (VITAMIN D2) 50000 UNITS capsule  Take 50,000 Units by mouth once a week. On Fridays     .  haloperidol (HALDOL) 1 MG tablet  take 1 tablet by mouth at bedtime  30 tablet  5   .  hydrALAZINE (APRESOLINE) 25 MG tablet  take 1 tablet by mouth twice a day  120 tablet  0   .  ibandronate (BONIVA) 150 MG tablet  Take 150 mg by mouth every 30 (thirty) days. Take in the morning with a full glass of water, on an empty stomach, and do not take anything else by mouth or lie down for the next 30 min. (take on the 6th day of each month)     .  lisinopril-hydrochlorothiazide (PRINZIDE,ZESTORETIC) 20-12.5 MG per tablet  take 1 tablet by mouth once daily  90 tablet  3   .  memantine (NAMENDA) 5 MG tablet  One twice daily for one week then titrate as instructed to two twice daily  120 tablet  11   .  Multiple Vitamin (MULITIVITAMIN WITH MINERALS) TABS  Take 1 tablet by mouth daily. Centrum silver     .  oxyCODONE-acetaminophen (PERCOCET) 10-325 MG per tablet  Take 1 tablet by mouth every 8 (eight) hours as needed.  90 tablet  0   .   polyethylene glycol powder (GLYCOLAX/MIRALAX) powder  Take 17 g by mouth daily as needed.     .  potassium chloride (K-DUR) 10 MEQ tablet  Take 2 tablets (20 mEq total) by mouth daily.  4 tablet  0   .  temazepam (RESTORIL) 15 MG capsule  Take 1 capsule (15 mg total) by mouth at bedtime as needed for sleep.  30 capsule  5    Past Medical History   Diagnosis  Date   .  Arthritis    .  Hyperlipidemia    .  Hypertension    .  Osteoporosis    .  Sick sinus syndrome     ROS:  All systems reviewed and negative except as noted in the HPI.  Past Surgical History   Procedure  Date   .  Appendectomy    .  Vesicovaginal fistula closure w/ tah    .  Tonsillectomy    .  Abdominal hysterectomy     Family History   Problem  Relation  Age of Onset   .  Hypertension  Mother    .  Heart disease  Mother    .  Diabetes  Mother    .  Hypertension  Sister    .  Heart disease  Brother     History    Social History   .  Marital Status:  Widowed     Spouse Name:  N/A     Number of Children:  N/A   .  Years of Education:  N/A    Occupational History   .  Not on file.    Social History Main Topics   .  Smoking status:  Never Smoker   .  Smokeless tobacco:  Former Systems developer     Quit date:  04/24/1969   .  Alcohol Use:  No   .  Drug Use:  No   .  Sexually Active:  Not on file    Other Topics  Concern   .  Not on file    Social History Narrative   .  No narrative on file    BP 113/49  Pulse 60  Ht 5' (1.524 m)  Wt 120 lb (54.432 kg)  BMI 23.44 kg/m2  SpO2 99%  Physical Exam:  Well appearing totally woman, NAD  HEENT: Unremarkable  Neck: No JVD, no thyromegally  Lungs: Clear with no wheezes, rales, or rhonchi.  HEART: Regular rate rhythm, no murmurs, no rubs, no clicks  Abd: soft, positive bowel sounds, no organomegally, no rebound, no guarding  Ext: 2 plus pulses, no edema, no cyanosis, no clubbing  Skin: No rashes no nodules  Neuro: CN II through XII intact, motor grossly intact    DEVICE  Normal device function. See PaceArt for details.  Assess/Plan:  Cardiac pacemaker in situ  Her St. Jude dual-chamber pacemaker has prematurely reached elective replacement. I have discussed the risks/benefits/goals/expectations of PPM generator removal and insertion of a new device and she wishes to proceed. HYPERTENSION   Her blood pressure is well controlled. She will continue her current medical therapy.  Mikle Bosworth.D.

## 2012-02-18 ENCOUNTER — Other Ambulatory Visit: Payer: Self-pay | Admitting: Family Medicine

## 2012-02-21 ENCOUNTER — Other Ambulatory Visit: Payer: Self-pay | Admitting: *Deleted

## 2012-02-21 MED ORDER — OXYCODONE-ACETAMINOPHEN 10-325 MG PO TABS: 1.0000 | ORAL_TABLET | Freq: Three times a day (TID) | ORAL | Status: DC

## 2012-02-21 NOTE — Telephone Encounter (Signed)
Pt daughter here with husband for OV, requesting mothers Oxycodone refill, last filled 01/21/12.

## 2012-02-27 ENCOUNTER — Encounter: Payer: Self-pay | Admitting: Internal Medicine

## 2012-02-27 ENCOUNTER — Ambulatory Visit (INDEPENDENT_AMBULATORY_CARE_PROVIDER_SITE_OTHER): Payer: Medicare Other | Admitting: *Deleted

## 2012-02-27 DIAGNOSIS — F341 Dysthymic disorder: Secondary | ICD-10-CM

## 2012-02-27 LAB — PACEMAKER DEVICE OBSERVATION
ATRIAL PACING PM: 51
BAMS-0001: 150 {beats}/min
DEVICE MODEL PM: 7368549
RV LEAD THRESHOLD: 1.25 V
VENTRICULAR PACING PM: 1.5

## 2012-02-27 NOTE — Progress Notes (Signed)
Wound check-PPM 

## 2012-03-20 ENCOUNTER — Other Ambulatory Visit: Payer: Self-pay | Admitting: Family Medicine

## 2012-03-20 NOTE — Telephone Encounter (Signed)
Pt needs refill of oxyCODONE-acetaminophen (PERCOCET) 10-325 MG per tablet. Thank you!

## 2012-03-21 MED ORDER — OXYCODONE-ACETAMINOPHEN 10-325 MG PO TABS: 1.0000 | ORAL_TABLET | Freq: Three times a day (TID) | ORAL | Status: DC

## 2012-03-21 NOTE — Telephone Encounter (Signed)
Diane informed Rx ready to pick-up

## 2012-03-21 NOTE — Telephone Encounter (Signed)
Last filled 02-21-12, #90 with 0 refills

## 2012-03-21 NOTE — Telephone Encounter (Signed)
Refill OK

## 2012-03-26 ENCOUNTER — Other Ambulatory Visit: Payer: Self-pay | Admitting: Family Medicine

## 2012-03-27 NOTE — Telephone Encounter (Signed)
Temazepam last filled 05-23-11, #30 with 5 refills. Last OV 10-22-11

## 2012-03-27 NOTE — Telephone Encounter (Signed)
Refill for 6 months. 

## 2012-04-22 ENCOUNTER — Telehealth: Payer: Self-pay | Admitting: Family Medicine

## 2012-04-22 ENCOUNTER — Other Ambulatory Visit: Payer: Self-pay | Admitting: *Deleted

## 2012-04-22 MED ORDER — OXYCODONE-ACETAMINOPHEN 10-325 MG PO TABS: 1.0000 | ORAL_TABLET | Freq: Three times a day (TID) | ORAL | Status: DC

## 2012-04-22 NOTE — Telephone Encounter (Signed)
Ok to refill for one month  

## 2012-04-22 NOTE — Addendum Note (Signed)
Addended by: Jill Side on: 04/22/2012 01:25 PM   Modules accepted: Orders

## 2012-04-22 NOTE — Telephone Encounter (Signed)
daughter informed Rx ready to pick-up

## 2012-04-22 NOTE — Telephone Encounter (Signed)
Dr Maudie Mercury, this is a Dr Elease Hashimoto pt requesting Oxycodone 10-325, pt takes TID.  Last filled 03-21-12, #90 with 0 refills.  Will you sign written Rx please/

## 2012-04-22 NOTE — Telephone Encounter (Signed)
Pt is aware MD out of office. Pt needs new rx percocet

## 2012-05-15 ENCOUNTER — Encounter: Payer: Medicare Other | Admitting: Internal Medicine

## 2012-05-19 ENCOUNTER — Telehealth: Payer: Self-pay | Admitting: Family Medicine

## 2012-05-19 NOTE — Telephone Encounter (Signed)
Patient's daughter called stating that she need a refill of her percocet 10-325mg  1po tid. Please assist.

## 2012-05-19 NOTE — Telephone Encounter (Signed)
Percocet TID last filled 04-22-12, #90 with 0 refills

## 2012-05-19 NOTE — Telephone Encounter (Signed)
Refill OK

## 2012-05-20 MED ORDER — OXYCODONE-ACETAMINOPHEN 10-325 MG PO TABS: 1.0000 | ORAL_TABLET | Freq: Three times a day (TID) | ORAL | Status: DC

## 2012-05-20 NOTE — Telephone Encounter (Signed)
Daughter informed Rx ready to pick-up on VM

## 2012-06-12 ENCOUNTER — Other Ambulatory Visit: Payer: Self-pay | Admitting: Family Medicine

## 2012-06-19 ENCOUNTER — Telehealth: Payer: Self-pay | Admitting: Family Medicine

## 2012-06-19 NOTE — Telephone Encounter (Signed)
Refill for one month.  I would like to see them try to decrease to one bid-if tolerated.

## 2012-06-19 NOTE — Telephone Encounter (Signed)
Patient's daughter called stating that she need a refill of her percocet 10-325 mg 1po tid. Please assist.

## 2012-06-19 NOTE — Telephone Encounter (Signed)
Percocet last filled 05-20-12, #90 with 0 refills

## 2012-06-20 MED ORDER — OXYCODONE-ACETAMINOPHEN 10-325 MG PO TABS: 1.0000 | ORAL_TABLET | Freq: Three times a day (TID) | ORAL | Status: DC

## 2012-06-20 NOTE — Telephone Encounter (Signed)
Heather Jarvis informed and she will try BID and see how it goes

## 2012-06-27 ENCOUNTER — Other Ambulatory Visit: Payer: Self-pay | Admitting: Family Medicine

## 2012-07-28 ENCOUNTER — Telehealth: Payer: Self-pay | Admitting: Family Medicine

## 2012-07-28 ENCOUNTER — Other Ambulatory Visit: Payer: Self-pay

## 2012-07-28 DIAGNOSIS — Z1231 Encounter for screening mammogram for malignant neoplasm of breast: Secondary | ICD-10-CM

## 2012-07-28 MED ORDER — OXYCODONE-ACETAMINOPHEN 10-325 MG PO TABS: 1.0000 | ORAL_TABLET | Freq: Three times a day (TID) | ORAL | Status: DC

## 2012-07-28 NOTE — Telephone Encounter (Signed)
Pt needs new rx percocet

## 2012-07-28 NOTE — Telephone Encounter (Signed)
daughter informed.

## 2012-07-28 NOTE — Telephone Encounter (Signed)
Percocet last filled 06-20-12, #90 with 0 refills Pt last OV was 10-22-11, I do not see any future OV scheduled

## 2012-07-28 NOTE — Telephone Encounter (Signed)
Refill once and schedule office visit.

## 2012-08-05 ENCOUNTER — Other Ambulatory Visit: Payer: Self-pay | Admitting: Family Medicine

## 2012-08-19 ENCOUNTER — Encounter: Payer: Self-pay | Admitting: Internal Medicine

## 2012-08-19 ENCOUNTER — Ambulatory Visit (INDEPENDENT_AMBULATORY_CARE_PROVIDER_SITE_OTHER): Payer: Medicare Other | Admitting: Internal Medicine

## 2012-08-19 VITALS — BP 119/71 | HR 63 | Ht 60.0 in | Wt 120.0 lb

## 2012-08-19 DIAGNOSIS — Z950 Presence of cardiac pacemaker: Secondary | ICD-10-CM

## 2012-08-19 DIAGNOSIS — R600 Localized edema: Secondary | ICD-10-CM | POA: Insufficient documentation

## 2012-08-19 DIAGNOSIS — F341 Dysthymic disorder: Secondary | ICD-10-CM | POA: Diagnosis not present

## 2012-08-19 DIAGNOSIS — I10 Essential (primary) hypertension: Secondary | ICD-10-CM

## 2012-08-19 DIAGNOSIS — R609 Edema, unspecified: Secondary | ICD-10-CM | POA: Diagnosis not present

## 2012-08-19 LAB — PACEMAKER DEVICE OBSERVATION
AL AMPLITUDE: 5 mv
AL IMPEDENCE PM: 375 Ohm
AL THRESHOLD: 0.75 v
ATRIAL PACING PM: 69
BAMS-0001: 150 {beats}/min
BAMS-0003: 70 {beats}/min
BATTERY VOLTAGE: 2.9478 v
DEVICE MODEL PM: 7368549
RV LEAD AMPLITUDE: 4.9 mv
RV LEAD IMPEDENCE PM: 437.5 Ohm
RV LEAD THRESHOLD: 1.25 v
VENTRICULAR PACING PM: 1

## 2012-08-19 NOTE — Progress Notes (Signed)
HPI Heather Jarvis returns today for followup. She is a very pleasant 79 year old woman with a history of symptomatic complete heart block, hypertension, status post permanent pacemaker insertion. She has been stable in the interim, but has developed worsening peripheral edema. Her family is with her today notes that she does not eat extra salt and has taken her medications. She denies chest pain or shortness of breath. No syncope. No weeping or draining from the skin of her lower extremities. No Known Allergies   Current Outpatient Prescriptions  Medication Sig Dispense Refill  . aspirin 81 MG tablet Take 81 mg by mouth daily.       . calcium-vitamin D (OSCAL WITH D) 500-200 MG-UNIT per tablet Take 1 tablet by mouth 2 (two) times daily.       . carvedilol (COREG) 12.5 MG tablet Take 12.5 mg by mouth 2 (two) times daily with a meal.      . Cholecalciferol (VITAMIN D) 2000 UNITS CAPS Take 2,000 Units by mouth daily.      . ergocalciferol (VITAMIN D2) 50000 UNITS capsule Take 50,000 Units by mouth once a week. On Fridays      . haloperidol (HALDOL) 1 MG tablet Take 1 mg by mouth at bedtime.      . hydrALAZINE (APRESOLINE) 25 MG tablet Take 25 mg by mouth 2 (two) times daily.      Marland Kitchen ibandronate (BONIVA) 150 MG tablet Take 150 mg by mouth every 30 (thirty) days. Take in the morning with a full glass of water, on an empty stomach, and do not take anything else by mouth or lie down for the next 30 min. (take on the 6th day of each month)      . lisinopril-hydrochlorothiazide (PRINZIDE,ZESTORETIC) 20-12.5 MG per tablet Take 1 tablet by mouth daily.      . memantine (NAMENDA) 5 MG tablet Take 5 mg by mouth daily.      . Multiple Vitamin (MULITIVITAMIN WITH MINERALS) TABS Take 1 tablet by mouth daily. Centrum silver      . oxyCODONE-acetaminophen (PERCOCET) 10-325 MG per tablet Take 1 tablet by mouth 3 (three) times daily.  90 tablet  0  . potassium chloride (K-DUR) 10 MEQ tablet Take 20 mEq by mouth daily.       . temazepam (RESTORIL) 15 MG capsule take 1 capsule by mouth at bedtime if needed  30 capsule  5   No current facility-administered medications for this visit.     Past Medical History  Diagnosis Date  . Arthritis   . Hyperlipidemia   . Hypertension   . Osteoporosis   . Sick sinus syndrome     ROS:   All systems reviewed and negative except as noted in the HPI.   Past Surgical History  Procedure Laterality Date  . Appendectomy    . Vesicovaginal fistula closure w/ tah    . Tonsillectomy    . Abdominal hysterectomy       Family History  Problem Relation Age of Onset  . Hypertension Mother   . Heart disease Mother   . Diabetes Mother   . Hypertension Sister   . Heart disease Brother      History   Social History  . Marital Status: Widowed    Spouse Name: N/A    Number of Children: N/A  . Years of Education: N/A   Occupational History  . Not on file.   Social History Main Topics  . Smoking status: Former Smoker -- 1.00 packs/day  for 25 years  . Smokeless tobacco: Former Systems developer    Quit date: 04/24/1969  . Alcohol Use: No  . Drug Use: No  . Sexually Active: Not on file   Other Topics Concern  . Not on file   Social History Narrative  . No narrative on file     BP 119/71  Pulse 63  Ht 5' (1.524 m)  Wt 120 lb (54.432 kg)  BMI 23.44 kg/m2  Physical Exam:  Well appearing elderly woman, rocking back and forth in her chair, NAD HEENT: Unremarkable Neck:  7 cm JVD, no thyromegally Lungs:  Clear with no wheezes, rales, or rhonchi. HEART:  Regular rate rhythm, no murmurs, no rubs, no clicks Abd:  soft, positive bowel sounds, no organomegally, no rebound, no guarding Ext:  2 plus pulses, no edema, no cyanosis, no clubbing Skin:  No rashes no nodules Neuro:  CN II through XII intact, motor grossly intact   DEVICE  Normal device function.  See PaceArt for details.   Assess/Plan:

## 2012-08-19 NOTE — Assessment & Plan Note (Signed)
Her St. Jude dual-chamber pacemaker is working normally. We'll plan to recheck in several months. 

## 2012-08-19 NOTE — Patient Instructions (Addendum)
Your physician wants you to follow-up in: 01/2013 with Dr Knox Saliva will receive a reminder letter in the mail two months in advance. If you don't receive a letter, please call our office to schedule the follow-up appointment.

## 2012-08-19 NOTE — Assessment & Plan Note (Signed)
Her symptoms are multifactorial and I suspect do to a combination of venous insufficiency, and diastolic heart failure. I've discussed the treatment options with her daughter in detail. I've recommended that she elevate her legs from our in the morning and in the evening so that her legs are above the level of her heart. If this does not help, advancing her diuretic therapy would be the next. She is instructed to call as if her symptoms worsen.

## 2012-08-19 NOTE — Assessment & Plan Note (Signed)
Her blood pressure is well controlled. I continued to encourage a low-sodium diet.

## 2012-08-20 ENCOUNTER — Ambulatory Visit
Admission: RE | Admit: 2012-08-20 | Discharge: 2012-08-20 | Disposition: A | Discharge status: Home / Self Care | Payer: Medicare Other | Source: Ambulatory Visit

## 2012-08-20 ENCOUNTER — Encounter: Payer: Self-pay | Admitting: Family Medicine

## 2012-08-20 ENCOUNTER — Ambulatory Visit (INDEPENDENT_AMBULATORY_CARE_PROVIDER_SITE_OTHER): Payer: Medicare Other | Admitting: Family Medicine

## 2012-08-20 VITALS — BP 110/70 | Temp 98.5°F | Wt 121.0 lb

## 2012-08-20 DIAGNOSIS — M810 Age-related osteoporosis without current pathological fracture: Secondary | ICD-10-CM | POA: Diagnosis not present

## 2012-08-20 DIAGNOSIS — G894 Chronic pain syndrome: Secondary | ICD-10-CM

## 2012-08-20 DIAGNOSIS — Z1231 Encounter for screening mammogram for malignant neoplasm of breast: Secondary | ICD-10-CM

## 2012-08-20 DIAGNOSIS — I10 Essential (primary) hypertension: Secondary | ICD-10-CM

## 2012-08-20 DIAGNOSIS — R609 Edema, unspecified: Secondary | ICD-10-CM | POA: Diagnosis not present

## 2012-08-20 DIAGNOSIS — D72829 Elevated white blood cell count, unspecified: Secondary | ICD-10-CM

## 2012-08-20 LAB — CBC WITH DIFFERENTIAL/PLATELET
Basophils Absolute: 0 10*3/uL (ref 0.0–0.1)
Eosinophils Absolute: 0.1 10*3/uL (ref 0.0–0.7)
HCT: 33.5 % — ABNORMAL LOW (ref 36.0–46.0)
Lymphs Abs: 3.5 10*3/uL (ref 0.7–4.0)
MCHC: 34.3 g/dL (ref 30.0–36.0)
Monocytes Absolute: 1.2 10*3/uL — ABNORMAL HIGH (ref 0.1–1.0)
Monocytes Relative: 12.1 % — ABNORMAL HIGH (ref 3.0–12.0)
Neutro Abs: 5.1 10*3/uL (ref 1.4–7.7)
Platelets: 228 10*3/uL (ref 150.0–400.0)
RDW: 12.7 % (ref 11.5–14.6)

## 2012-08-20 LAB — BASIC METABOLIC PANEL
BUN: 46 mg/dL — ABNORMAL HIGH (ref 6–23)
CO2: 30 mEq/L (ref 19–32)
GFR: 32.1 mL/min — ABNORMAL LOW (ref >60.00)
Glucose, Bld: 84 mg/dL (ref 70–99)
Potassium: 4.7 mEq/L (ref 3.5–5.1)

## 2012-08-20 NOTE — Addendum Note (Signed)
Addended by: Eulas Post on: 08/20/2012 05:33 PM   Modules accepted: Orders

## 2012-08-20 NOTE — Progress Notes (Addendum)
  Subjective:    Patient ID: Heather Jarvis, female    DOB: [birth year], 79 y.o.   MRN: [id number]  HPI Routine medical followup  She has multiple chronic problems including history of dementia, hypertension, osteoporosis, delusional disorder, chronic mild leukocytosis, chronic insomnia, chronic pain disorder. She has pacemaker and had new battery replaced in November. Denies any recent chest pains. No dyspnea. She has some chronic bilateral peripheral edema which is improved with elevation. No recent heart failure issues.  Her weight is actually up a few pounds from last visit and appetite is slightly improved. She is sleeping well at night. We discussed getting her off Restoril but she's had difficulty sleeping without this. She remains on Haldol 1 mg at night  Chronic pain treated with oxycodone and 10 mg 3 times a day. No recent constipation issues. Pain adequately controlled. She's been on this for many years.  Past Medical History  Diagnosis Date  . Arthritis   . Hyperlipidemia   . Hypertension   . Osteoporosis   . Sick sinus syndrome    Past Surgical History  Procedure Laterality Date  . Appendectomy    . Vesicovaginal fistula closure w/ tah    . Tonsillectomy    . Abdominal hysterectomy      reports that she has quit smoking. She quit smokeless tobacco use about 43 years ago. She reports that she does not drink alcohol or use illicit drugs. family history includes Diabetes in her mother; Heart disease in her brother and mother; and Hypertension in her mother and sister. No Known Allergies    Review of Systems  Constitutional: Positive for fatigue.  Eyes: Negative for visual disturbance.  Respiratory: Negative for cough, chest tightness, shortness of breath and wheezing.   Cardiovascular: Positive for leg swelling. Negative for chest pain and palpitations.  Gastrointestinal: Negative for abdominal pain and constipation.  Genitourinary: Negative for dysuria.  Neurological:  Negative for dizziness, seizures, syncope, weakness, light-headedness and headaches.       Objective:   Physical Exam  Constitutional: She appears well-developed and well-nourished.  HENT:  Mouth/Throat: Oropharynx is clear and moist.  Neck: Neck supple. No thyromegaly present.  Cardiovascular: Normal rate.   Pulmonary/Chest: Effort normal and breath sounds normal. No respiratory distress. She has no wheezes. She has no rales.  Musculoskeletal: She exhibits edema.  She has mild nonpitting edema legs bilaterally  Neurological: She is alert. No cranial nerve deficit.          Assessment & Plan:  #1 history of dementia. We have previously discussed other options such as Aricept at this point Foley would make very little difference in her overall treatment. Continue Namenda #2 mild peripheral edema. No evidence for overt CHF. Discussed compression garments at this point they do not wish to pursue that. Check basic metabolic panel #3 history of chronic mild leukocytosis. Explained she may have something like CLL but this will not likely require a treatment. Recheck CBC #4 hypertension. Well controlled #5 chronic pain syndrome with chronic back pain which is stable on oxycodone. No recent constipation issues #6 chronic insomnia. Discussed options such as trazodone or even possibly Remeron but daughter-in-law is reluctant to make changes as she is very stable this time  High calcium 12.1.  She had high similar calcium back 11/13 ordered by someone else and we just saw that old lab today.  Will further evaluate with hepatic panel (check albumin), TSH, sedimentation rate, intact PTH, SPEP, and UPEP

## 2012-08-21 NOTE — Progress Notes (Signed)
Quick Note:  Pt daughter informed, scheduling appt now. ______

## 2012-08-21 NOTE — Progress Notes (Signed)
Quick Note:  Pt daughter informed and they are scheduling lab appt now ______

## 2012-08-28 ENCOUNTER — Telehealth: Payer: Self-pay | Admitting: Family Medicine

## 2012-08-28 ENCOUNTER — Other Ambulatory Visit (INDEPENDENT_AMBULATORY_CARE_PROVIDER_SITE_OTHER): Payer: Medicare Other

## 2012-08-28 LAB — BASIC METABOLIC PANEL
CO2: 31 mEq/L (ref 19–32)
Calcium: 12.4 mg/dL — ABNORMAL HIGH (ref 8.4–10.5)
Creatinine, Ser: 1.6 mg/dL — ABNORMAL HIGH (ref 0.4–1.2)
GFR: 34.02 mL/min — ABNORMAL LOW (ref >60.00)
Sodium: 139 mEq/L (ref 135–145)

## 2012-08-28 LAB — TSH: TSH: 0.21 u[IU]/mL — ABNORMAL LOW (ref 0.35–5.50)

## 2012-08-28 LAB — SEDIMENTATION RATE: Sed Rate: 26 mm/hr — ABNORMAL HIGH (ref 0–22)

## 2012-08-28 LAB — HEPATIC FUNCTION PANEL
Bilirubin, Direct: 0 mg/dL (ref 0.0–0.3)
Total Bilirubin: 0.3 mg/dL (ref 0.3–1.2)

## 2012-08-28 MED ORDER — OXYCODONE-ACETAMINOPHEN 10-325 MG PO TABS: 1.0000 | ORAL_TABLET | Freq: Three times a day (TID) | ORAL | Status: DC

## 2012-08-28 NOTE — Telephone Encounter (Signed)
PT daughter in law calling to request a refill of the PT's oxyCODONE-acetaminophen (PERCOCET) 10-325 MG per tablet. Please assist.

## 2012-08-28 NOTE — Telephone Encounter (Signed)
Percocet last filled #90 with 0 refills on 07-28-12

## 2012-08-28 NOTE — Telephone Encounter (Signed)
rx ready to pick up

## 2012-08-28 NOTE — Telephone Encounter (Signed)
Refill OK

## 2012-08-29 LAB — PROTEIN ELECTROPHORESIS, URINE REFLEX

## 2012-08-29 LAB — PARATHYROID HORMONE, INTACT (NO CA): PTH: 8.8 pg/mL — ABNORMAL LOW (ref 14.0–72.0)

## 2012-09-01 ENCOUNTER — Telehealth: Payer: Self-pay | Admitting: *Deleted

## 2012-09-01 DIAGNOSIS — E0590 Thyrotoxicosis, unspecified without thyrotoxic crisis or storm: Secondary | ICD-10-CM

## 2012-09-01 LAB — PROTEIN ELECTROPHORESIS, SERUM
Albumin ELP: 54.5 % — ABNORMAL LOW (ref 55.8–66.1)
Alpha-2-Globulin: 12.5 % — ABNORMAL HIGH (ref 7.1–11.8)
Beta Globulin: 7.3 % — ABNORMAL HIGH (ref 4.7–7.2)
M-Spike, %: 0.68 g/dL
Total Protein, Serum Electrophoresis: 6.7 g/dL (ref 6.0–8.3)

## 2012-09-01 NOTE — Telephone Encounter (Signed)
LMOM with contact name and number for return call [phone] and further provider instructions; future lab orders pending/SLS

## 2012-09-01 NOTE — Telephone Encounter (Signed)
Message copied by Rockwell Germany on Mon Sep 01, 2012  3:58 PM ------      Message from: Eulas Post      Created: Sun Aug 31, 2012  6:53 PM       Labs suggest possible mild hyperthyroidism.  Pt needs to return next week for repeat TSH, free T4 and free T3. ------

## 2012-09-02 NOTE — Telephone Encounter (Signed)
Patient's [DIL], [name];  informed, understood & agreed; future lab orders placed, will call back to make lab appointment/SLS

## 2012-09-10 ENCOUNTER — Other Ambulatory Visit (INDEPENDENT_AMBULATORY_CARE_PROVIDER_SITE_OTHER): Payer: Medicare Other

## 2012-09-10 DIAGNOSIS — R3915 Urgency of urination: Secondary | ICD-10-CM

## 2012-09-10 DIAGNOSIS — E0590 Thyrotoxicosis, unspecified without thyrotoxic crisis or storm: Secondary | ICD-10-CM

## 2012-09-10 LAB — POCT URINALYSIS DIPSTICK
Blood, UA: NEGATIVE
Glucose, UA: NEGATIVE
Nitrite, UA: POSITIVE
Protein, UA: NEGATIVE
Spec Grav, UA: 1.015
Urobilinogen, UA: 0.2

## 2012-09-10 LAB — TSH: TSH: 0.5 u[IU]/mL (ref 0.35–5.50)

## 2012-09-10 LAB — T3, FREE: T3, Free: 2.3 pg/mL (ref 2.3–4.2)

## 2012-09-11 LAB — T4, FREE: Free T4: 1 ng/dL (ref 0.60–1.60)

## 2012-09-15 ENCOUNTER — Other Ambulatory Visit: Payer: Self-pay | Admitting: *Deleted

## 2012-09-15 MED ORDER — CEPHALEXIN 500 MG PO CAPS: 500.0000 mg | ORAL_CAPSULE | Freq: Three times a day (TID) | ORAL | Status: DC

## 2012-09-17 ENCOUNTER — Other Ambulatory Visit: Payer: Self-pay | Admitting: Family Medicine

## 2012-09-24 ENCOUNTER — Telehealth: Payer: Self-pay | Admitting: Family Medicine

## 2012-09-24 MED ORDER — OXYCODONE-ACETAMINOPHEN 10-325 MG PO TABS: 1.0000 | ORAL_TABLET | Freq: Three times a day (TID) | ORAL | Status: DC

## 2012-09-24 NOTE — Telephone Encounter (Signed)
PT daughter in law called to request a refill of her oxyCODONE-acetaminophen (PERCOCET) 10-325 MG per tablet. Please assist.

## 2012-09-24 NOTE — Telephone Encounter (Signed)
Refill OK

## 2012-09-24 NOTE — Telephone Encounter (Signed)
Printed off and given to daughter in law

## 2012-09-24 NOTE — Telephone Encounter (Signed)
Pt was given #90 no refills on 08/28/12 Last seen on 08/20/12

## 2012-09-30 ENCOUNTER — Other Ambulatory Visit: Payer: Self-pay

## 2012-09-30 MED ORDER — TEMAZEPAM 15 MG PO CAPS: ORAL_CAPSULE | ORAL | Status: DC

## 2012-10-05 ENCOUNTER — Other Ambulatory Visit: Payer: Self-pay | Admitting: Family Medicine

## 2012-10-23 ENCOUNTER — Telehealth: Payer: Self-pay | Admitting: Family Medicine

## 2012-10-23 NOTE — Telephone Encounter (Signed)
Refill okay. I would like daughter-in-law to consider trying to taper back to one twice daily, if possible

## 2012-10-23 NOTE — Telephone Encounter (Signed)
Pt requesting refill on oxyCODONE-acetaminophen (PERCOCET) 10-325 MG per tablet Please call daughter when ready for pick. DPR on file.

## 2012-10-23 NOTE — Telephone Encounter (Signed)
Last refill 09/24/12 #90 no refill Last visit 08/20/12

## 2012-10-24 ENCOUNTER — Other Ambulatory Visit: Payer: Self-pay | Admitting: Family Medicine

## 2012-10-24 MED ORDER — OXYCODONE-ACETAMINOPHEN 10-325 MG PO TABS: 1.0000 | ORAL_TABLET | Freq: Three times a day (TID) | ORAL | Status: DC

## 2012-10-24 NOTE — Telephone Encounter (Signed)
Daughter in law is aware rx will be at the front for pickup and to try to taper back to one twice daily if possible.

## 2012-12-01 ENCOUNTER — Telehealth: Payer: Self-pay | Admitting: Family Medicine

## 2012-12-01 MED ORDER — OXYCODONE-ACETAMINOPHEN 10-325 MG PO TABS: 1.0000 | ORAL_TABLET | Freq: Three times a day (TID) | ORAL | Status: DC

## 2012-12-01 NOTE — Telephone Encounter (Signed)
Last refill 10/24/2012 #90 0 refill Last visit 08/20/12

## 2012-12-01 NOTE — Telephone Encounter (Signed)
Pt daughter is calling to request a refill of her oxyCODONE-acetaminophen (PERCOCET) 10-325 MG per tablet. Pt son has appointment this afternoon, and would like to pick up RX at that time, please assist.

## 2012-12-01 NOTE — Telephone Encounter (Signed)
Ok to refill 

## 2012-12-01 NOTE — Telephone Encounter (Signed)
RX at the front front for pick up

## 2012-12-24 ENCOUNTER — Other Ambulatory Visit: Payer: Self-pay | Admitting: Family Medicine

## 2013-01-01 ENCOUNTER — Telehealth: Payer: Self-pay | Admitting: Family Medicine

## 2013-01-01 MED ORDER — OXYCODONE-ACETAMINOPHEN 10-325 MG PO TABS: 1.0000 | ORAL_TABLET | Freq: Three times a day (TID) | ORAL | Status: DC

## 2013-01-01 NOTE — Telephone Encounter (Signed)
Pt daughter in law is informed that the RX will be ready for pickup

## 2013-01-01 NOTE — Telephone Encounter (Signed)
Refill OK

## 2013-01-01 NOTE — Telephone Encounter (Signed)
Last visit 08/20/12 Last refill 12/01/12 #90 0 refill

## 2013-01-01 NOTE — Telephone Encounter (Signed)
Pt needs new rx percocet

## 2013-01-09 ENCOUNTER — Other Ambulatory Visit: Payer: Self-pay | Admitting: Family Medicine

## 2013-01-22 ENCOUNTER — Ambulatory Visit (INDEPENDENT_AMBULATORY_CARE_PROVIDER_SITE_OTHER): Payer: Medicare Other | Admitting: Internal Medicine

## 2013-01-22 ENCOUNTER — Encounter: Payer: Self-pay | Admitting: Internal Medicine

## 2013-01-22 VITALS — BP 133/74 | HR 70 | Ht 60.0 in | Wt 115.0 lb

## 2013-01-22 DIAGNOSIS — F341 Dysthymic disorder: Secondary | ICD-10-CM

## 2013-01-22 LAB — MDC_IDC_ENUM_SESS_TYPE_INCLINIC
Battery Remaining Longevity: 88.8 mo
Brady Statistic RA Percent Paced: 82 %
Lead Channel Impedance Value: 375 Ohm
Lead Channel Impedance Value: 462.5 Ohm
Lead Channel Pacing Threshold Pulse Width: 0.7 ms
Lead Channel Setting Pacing Amplitude: 2 V
Lead Channel Setting Pacing Pulse Width: 0.7 ms
Lead Channel Setting Sensing Sensitivity: 2 mV

## 2013-01-22 NOTE — Progress Notes (Signed)
HPI Heather Jarvis returns today for followup. She is a very pleasant 79 year old woman with a history of symptomatic complete heart block, hypertension, status post permanent pacemaker insertion. She has been stable in the interim. Her daughter is with her today and notes that she does not eat extra salt and has taken her medications. She denies chest pain or shortness of breath. No syncope. No peripheral edema. No Known Allergies   Current Outpatient Prescriptions  Medication Sig Dispense Refill  . aspirin 81 MG tablet Take 81 mg by mouth daily.       . calcium-vitamin D (OSCAL WITH D) 500-200 MG-UNIT per tablet Take 1 tablet by mouth 2 (two) times daily.       . carvedilol (COREG) 12.5 MG tablet Take 12.5 mg by mouth 2 (two) times daily with a meal.      . carvedilol (COREG) 12.5 MG tablet take 1 tablet by mouth twice a day  60 tablet  11  . cephALEXin (KEFLEX) 500 MG capsule Take 1 capsule (500 mg total) by mouth 3 (three) times daily.  21 capsule  0  . Cholecalciferol (VITAMIN D) 2000 UNITS CAPS Take 2,000 Units by mouth daily.      . ergocalciferol (VITAMIN D2) 50000 UNITS capsule Take 50,000 Units by mouth once a week. On Fridays      . haloperidol (HALDOL) 1 MG tablet take 1 tablet by mouth at bedtime  30 tablet  5  . hydrALAZINE (APRESOLINE) 25 MG tablet take 1 tablet by mouth twice a day  60 tablet  3  . ibandronate (BONIVA) 150 MG tablet Take 150 mg by mouth every 30 (thirty) days. Take in the morning with a full glass of water, on an empty stomach, and do not take anything else by mouth or lie down for the next 30 min. (take on the 6th day of each month)      . lisinopril-hydrochlorothiazide (PRINZIDE,ZESTORETIC) 20-12.5 MG per tablet Take 1 tablet by mouth daily.      . Multiple Vitamin (MULITIVITAMIN WITH MINERALS) TABS Take 1 tablet by mouth daily. Centrum silver      . NAMENDA 5 MG tablet take 1 tablet by mouth twice a day for 1 week then TITRTATE AS INSTRUCTED TO 2 TABLETS TWICE A DAY   120 tablet  3  . oxyCODONE-acetaminophen (PERCOCET) 10-325 MG per tablet Take 1 tablet by mouth 3 (three) times daily.  90 tablet  0  . potassium chloride (K-DUR) 10 MEQ tablet Take 20 mEq by mouth daily.      . temazepam (RESTORIL) 15 MG capsule take 1 capsule by mouth at bedtime if needed  30 capsule  5   No current facility-administered medications for this visit.     Past Medical History  Diagnosis Date  . Arthritis   . Hyperlipidemia   . Hypertension   . Osteoporosis   . Sick sinus syndrome     ROS:   All systems reviewed and negative except as noted in the HPI.   Past Surgical History  Procedure Laterality Date  . Appendectomy    . Vesicovaginal fistula closure w/ tah    . Tonsillectomy    . Abdominal hysterectomy       Family History  Problem Relation Age of Onset  . Hypertension Mother   . Heart disease Mother   . Diabetes Mother   . Hypertension Sister   . Heart disease Brother      History   Social History  .  Marital Status: Widowed    Spouse Name: N/A    Number of Children: N/A  . Years of Education: N/A   Occupational History  . Not on file.   Social History Main Topics  . Smoking status: Former Smoker -- 1.00 packs/day for 25 years  . Smokeless tobacco: Former Systems developer    Quit date: 04/24/1969  . Alcohol Use: No  . Drug Use: No  . Sexual Activity: Not on file   Other Topics Concern  . Not on file   Social History Narrative  . No narrative on file     BP 133/74  Pulse 70  Ht 5' (1.524 m)  Wt 115 lb (52.164 kg)  BMI 22.46 kg/m2  Physical Exam:  Well appearing elderly woman, NAD HEENT: Unremarkable Neck:  7 cm JVD, no thyromegally Lungs:  Clear with no wheezes, rales, or rhonchi. HEART:  Regular rate rhythm, no murmurs, no rubs, no clicks Abd:  soft, positive bowel sounds, no organomegally, no rebound, no guarding Ext:  2 plus pulses, no edema, no cyanosis, no clubbing Skin:  No rashes no nodules Neuro:  CN II through XII  intact, motor grossly intact   DEVICE  Normal device function.  See PaceArt for details.   Assess/Plan:

## 2013-01-22 NOTE — Patient Instructions (Signed)
Your physician wants you to follow-up in: 12 months with Dr Knox Saliva will receive a reminder letter in the mail two months in advance. If you don't receive a letter, please call our office to schedule the follow-up appointment.  Remote monitoring is used to monitor your Pacemaker or ICD from home. This monitoring reduces the number of office visits required to check your device to one time per year. It allows [place] to keep an eye on the functioning of your device to ensure it is working properly. You are scheduled for a device check from home on 04/27/13. You may send your transmission at any time that day. If you have a wireless device, the transmission will be sent automatically. After your physician reviews your transmission, you will receive a postcard with your next transmission date.

## 2013-02-02 ENCOUNTER — Telehealth: Payer: Self-pay | Admitting: Family Medicine

## 2013-02-02 MED ORDER — OXYCODONE-ACETAMINOPHEN 10-325 MG PO TABS: 1.0000 | ORAL_TABLET | Freq: Three times a day (TID) | ORAL | Status: DC

## 2013-02-02 NOTE — Telephone Encounter (Signed)
Pt request refill of  oxyCODONE-acetaminophen (PERCOCET) 10-325 MG per tablet #90

## 2013-02-02 NOTE — Telephone Encounter (Signed)
Last visit 08/20/12 Last refill 01/01/13 #90 0 refill

## 2013-02-02 NOTE — Telephone Encounter (Signed)
Refill OK..  I would schedule follow up by January.

## 2013-02-02 NOTE — Telephone Encounter (Signed)
Pt is aware that RX is ready for pick up and that she needs a follow up by Jan.

## 2013-03-02 ENCOUNTER — Ambulatory Visit (INDEPENDENT_AMBULATORY_CARE_PROVIDER_SITE_OTHER): Payer: Medicare Other | Admitting: Family Medicine

## 2013-03-02 ENCOUNTER — Encounter: Payer: Self-pay | Admitting: Family Medicine

## 2013-03-02 VITALS — BP 110/70 | HR 58 | Temp 97.5°F | Wt 112.0 lb

## 2013-03-02 DIAGNOSIS — R5381 Other malaise: Secondary | ICD-10-CM | POA: Diagnosis not present

## 2013-03-02 DIAGNOSIS — M549 Dorsalgia, unspecified: Secondary | ICD-10-CM | POA: Diagnosis not present

## 2013-03-02 DIAGNOSIS — Z79899 Other long term (current) drug therapy: Secondary | ICD-10-CM | POA: Diagnosis not present

## 2013-03-02 DIAGNOSIS — F0390 Unspecified dementia without behavioral disturbance: Secondary | ICD-10-CM

## 2013-03-02 DIAGNOSIS — Z23 Encounter for immunization: Secondary | ICD-10-CM

## 2013-03-02 DIAGNOSIS — G8929 Other chronic pain: Secondary | ICD-10-CM

## 2013-03-02 DIAGNOSIS — I10 Essential (primary) hypertension: Secondary | ICD-10-CM

## 2013-03-02 DIAGNOSIS — G4700 Insomnia, unspecified: Secondary | ICD-10-CM

## 2013-03-02 DIAGNOSIS — R5383 Other fatigue: Secondary | ICD-10-CM

## 2013-03-02 LAB — BASIC METABOLIC PANEL
BUN: 35 mg/dL — ABNORMAL HIGH (ref 6–23)
Chloride: 103 mEq/L (ref 96–112)
Glucose, Bld: 113 mg/dL — ABNORMAL HIGH (ref 70–99)
Potassium: 4.3 mEq/L (ref 3.5–5.1)

## 2013-03-02 LAB — HEPATIC FUNCTION PANEL
ALT: 49 U/L — ABNORMAL HIGH (ref 0–35)
AST: 26 U/L (ref 0–37)
Albumin: 3.8 g/dL (ref 3.5–5.2)
Alkaline Phosphatase: 50 U/L (ref 39–117)
Total Bilirubin: 0.5 mg/dL (ref 0.3–1.2)

## 2013-03-02 LAB — TSH: TSH: 1.69 u[IU]/mL (ref 0.35–5.50)

## 2013-03-02 MED ORDER — OXYCODONE-ACETAMINOPHEN 10-325 MG PO TABS: 1.0000 | ORAL_TABLET | Freq: Three times a day (TID) | ORAL | Status: DC

## 2013-03-02 NOTE — Addendum Note (Signed)
Addended by: Marcina Millard on: 03/02/2013 03:43 PM   Modules accepted: Orders

## 2013-03-02 NOTE — Progress Notes (Signed)
   Subjective:    Patient ID: Heather Jarvis, female    DOB: Oct 04, 1928, 79 y.o.   MRN: [id number]  HPI Medical followup Patient has multiple medical problems including history of advanced dementia, hypertension, osteoporosis, chronic pain syndrome, history of delusions, chronic insomnia. She is accompanied by daughter-in-law. Medications reviewed. Her dementia is advanced and daughter-in-law stopped her Namenda sometime back. They were not seen with this was making much difference. She has not had any significant agitation or active delusions. She takes Haldol at night.  She has history of chronic back pain and has been maintained for many years on Percocet 10 mg 3 times a day. Daughter has tried reducing this to twice a day but she's had significant breakthrough pain. Will not try to taper any further at this time. No reported constipation issues recently. No recent falls.   Check past history of CHF and acute renal failure. She remains on current blood pressure medications as outlined elsewhere and no recent orthostasis. Her weight is down 9 pounds from last visit and daughter in law was not aware. States she eats fairly well. She's had no complaints though history is unreliable.  Past Medical History  Diagnosis Date  . Arthritis   . Hyperlipidemia   . Hypertension   . Osteoporosis   . Sick sinus syndrome    Past Surgical History  Procedure Laterality Date  . Appendectomy    . Vesicovaginal fistula closure w/ tah    . Tonsillectomy    . Abdominal hysterectomy      reports that she has quit smoking. She quit smokeless tobacco use about 43 years ago. She reports that she does not drink alcohol or use illicit drugs. family history includes Diabetes in her mother; Heart disease in her brother and mother; Hypertension in her mother and sister. No Known Allergies   Review of Systems  Constitutional: Positive for fatigue and unexpected weight change. Negative for fever and appetite change.    Eyes: Negative for visual disturbance.  Respiratory: Negative for cough, chest tightness, shortness of breath and wheezing.   Cardiovascular: Negative for chest pain, palpitations and leg swelling.  Genitourinary: Negative for dysuria.  Neurological: Negative for dizziness, seizures, syncope, weakness, light-headedness and headaches.       Objective:   Physical Exam  Constitutional: She appears well-developed and well-nourished.  HENT:  Mouth/Throat: Oropharynx is clear and moist.  Neck: Neck supple. No thyromegaly present.  Cardiovascular: Normal rate.   Pulmonary/Chest: Effort normal and breath sounds normal. No respiratory distress. She has no wheezes. She has no rales.  Musculoskeletal: She exhibits no edema.  Neurological: She is alert.  Patient is oriented to month but not day of week, date, or year          Assessment & Plan:  #1 advanced dementia. At this point, doubt she will gain much benefit from medication such as Namenda. We'll leave off for now. #2 hypertension. Stable. No recent orthostasis. Check basic metabolic panel #3 chronic pain syndrome. We discussed attempts at further reduction in pain medication and daughter in law has been unsuccessful with this recently. Obtain drug screen #4 chronic fatigue. Likely related to her dementia. She has had some weight loss since last visit. We discussed possible use of supplements #5 health maintenance. Flu vaccine given. Prevnar 13 given. We discussed fall risk prevention

## 2013-03-02 NOTE — Progress Notes (Signed)
Pre visit review using our clinic review tool, if applicable. No additional management support is needed unless otherwise documented below in the visit note. 

## 2013-03-08 ENCOUNTER — Encounter (HOSPITAL_COMMUNITY): Payer: Self-pay | Admitting: Emergency Medicine

## 2013-03-08 ENCOUNTER — Inpatient Hospital Stay (HOSPITAL_COMMUNITY)
Admission: EM | Admit: 2013-03-08 | Discharge: 2013-03-13 | DRG: 871 | Disposition: A | Discharge status: Skilled Nursing Facility | Payer: Medicare Other | Attending: Internal Medicine | Admitting: Internal Medicine

## 2013-03-08 ENCOUNTER — Emergency Department (HOSPITAL_COMMUNITY): Payer: Medicare Other

## 2013-03-08 DIAGNOSIS — Z5189 Encounter for other specified aftercare: Secondary | ICD-10-CM | POA: Diagnosis not present

## 2013-03-08 DIAGNOSIS — Z7982 Long term (current) use of aspirin: Secondary | ICD-10-CM | POA: Diagnosis not present

## 2013-03-08 DIAGNOSIS — J189 Pneumonia, unspecified organism: Secondary | ICD-10-CM | POA: Diagnosis not present

## 2013-03-08 DIAGNOSIS — R279 Unspecified lack of coordination: Secondary | ICD-10-CM | POA: Diagnosis not present

## 2013-03-08 DIAGNOSIS — R509 Fever, unspecified: Secondary | ICD-10-CM | POA: Diagnosis not present

## 2013-03-08 DIAGNOSIS — E440 Moderate protein-calorie malnutrition: Secondary | ICD-10-CM | POA: Diagnosis present

## 2013-03-08 DIAGNOSIS — G8929 Other chronic pain: Secondary | ICD-10-CM

## 2013-03-08 DIAGNOSIS — I5031 Acute diastolic (congestive) heart failure: Secondary | ICD-10-CM | POA: Diagnosis not present

## 2013-03-08 DIAGNOSIS — E785 Hyperlipidemia, unspecified: Secondary | ICD-10-CM | POA: Diagnosis present

## 2013-03-08 DIAGNOSIS — IMO0002 Reserved for concepts with insufficient information to code with codable children: Secondary | ICD-10-CM

## 2013-03-08 DIAGNOSIS — Z8249 Family history of ischemic heart disease and other diseases of the circulatory system: Secondary | ICD-10-CM | POA: Diagnosis not present

## 2013-03-08 DIAGNOSIS — E871 Hypo-osmolality and hyponatremia: Secondary | ICD-10-CM | POA: Diagnosis not present

## 2013-03-08 DIAGNOSIS — A419 Sepsis, unspecified organism: Principal | ICD-10-CM

## 2013-03-08 DIAGNOSIS — N390 Urinary tract infection, site not specified: Secondary | ICD-10-CM | POA: Diagnosis not present

## 2013-03-08 DIAGNOSIS — F3289 Other specified depressive episodes: Secondary | ICD-10-CM | POA: Diagnosis present

## 2013-03-08 DIAGNOSIS — R262 Difficulty in walking, not elsewhere classified: Secondary | ICD-10-CM | POA: Diagnosis not present

## 2013-03-08 DIAGNOSIS — D649 Anemia, unspecified: Secondary | ICD-10-CM

## 2013-03-08 DIAGNOSIS — Z833 Family history of diabetes mellitus: Secondary | ICD-10-CM

## 2013-03-08 DIAGNOSIS — N10 Acute tubulo-interstitial nephritis: Secondary | ICD-10-CM

## 2013-03-08 DIAGNOSIS — J1100 Influenza due to unidentified influenza virus with unspecified type of pneumonia: Secondary | ICD-10-CM

## 2013-03-08 DIAGNOSIS — Z87891 Personal history of nicotine dependence: Secondary | ICD-10-CM | POA: Diagnosis not present

## 2013-03-08 DIAGNOSIS — F0390 Unspecified dementia without behavioral disturbance: Secondary | ICD-10-CM | POA: Diagnosis present

## 2013-03-08 DIAGNOSIS — N179 Acute kidney failure, unspecified: Secondary | ICD-10-CM | POA: Diagnosis present

## 2013-03-08 DIAGNOSIS — R627 Adult failure to thrive: Secondary | ICD-10-CM | POA: Diagnosis present

## 2013-03-08 DIAGNOSIS — F329 Major depressive disorder, single episode, unspecified: Secondary | ICD-10-CM | POA: Diagnosis present

## 2013-03-08 DIAGNOSIS — D638 Anemia in other chronic diseases classified elsewhere: Secondary | ICD-10-CM | POA: Diagnosis present

## 2013-03-08 DIAGNOSIS — E860 Dehydration: Secondary | ICD-10-CM | POA: Diagnosis present

## 2013-03-08 DIAGNOSIS — J111 Influenza due to unidentified influenza virus with other respiratory manifestations: Secondary | ICD-10-CM | POA: Insufficient documentation

## 2013-03-08 DIAGNOSIS — Z950 Presence of cardiac pacemaker: Secondary | ICD-10-CM

## 2013-03-08 DIAGNOSIS — E236 Other disorders of pituitary gland: Secondary | ICD-10-CM | POA: Diagnosis present

## 2013-03-08 DIAGNOSIS — R29818 Other symptoms and signs involving the nervous system: Secondary | ICD-10-CM | POA: Diagnosis not present

## 2013-03-08 DIAGNOSIS — I10 Essential (primary) hypertension: Secondary | ICD-10-CM | POA: Diagnosis not present

## 2013-03-08 DIAGNOSIS — F22 Delusional disorders: Secondary | ICD-10-CM

## 2013-03-08 DIAGNOSIS — M810 Age-related osteoporosis without current pathological fracture: Secondary | ICD-10-CM | POA: Diagnosis present

## 2013-03-08 DIAGNOSIS — R5381 Other malaise: Secondary | ICD-10-CM | POA: Diagnosis present

## 2013-03-08 DIAGNOSIS — A4151 Sepsis due to Escherichia coli [E. coli]: Secondary | ICD-10-CM | POA: Insufficient documentation

## 2013-03-08 DIAGNOSIS — F411 Generalized anxiety disorder: Secondary | ICD-10-CM | POA: Diagnosis present

## 2013-03-08 DIAGNOSIS — G894 Chronic pain syndrome: Secondary | ICD-10-CM

## 2013-03-08 DIAGNOSIS — N12 Tubulo-interstitial nephritis, not specified as acute or chronic: Secondary | ICD-10-CM | POA: Diagnosis present

## 2013-03-08 DIAGNOSIS — M1990 Unspecified osteoarthritis, unspecified site: Secondary | ICD-10-CM | POA: Diagnosis not present

## 2013-03-08 DIAGNOSIS — F341 Dysthymic disorder: Secondary | ICD-10-CM | POA: Diagnosis present

## 2013-03-08 DIAGNOSIS — R4182 Altered mental status, unspecified: Secondary | ICD-10-CM | POA: Diagnosis not present

## 2013-03-08 LAB — COMPREHENSIVE METABOLIC PANEL
ALT: 35 U/L (ref 0–35)
AST: 33 U/L (ref 0–37)
Albumin: 3.3 g/dL — ABNORMAL LOW (ref 3.5–5.2)
Alkaline Phosphatase: 50 U/L (ref 39–117)
BUN: 34 mg/dL — ABNORMAL HIGH (ref 6–23)
Chloride: 100 mEq/L (ref 96–112)
GFR calc non Af Amer: 35 mL/min — ABNORMAL LOW (ref >90)
Glucose, Bld: 95 mg/dL (ref 70–99)
Potassium: 3.7 mEq/L (ref 3.5–5.1)
Total Protein: 6.7 g/dL (ref 6.0–8.3)

## 2013-03-08 LAB — URINALYSIS, ROUTINE W REFLEX MICROSCOPIC
Glucose, UA: NEGATIVE mg/dL
Ketones, ur: NEGATIVE mg/dL
pH: 5.5 (ref 5.0–8.0)

## 2013-03-08 LAB — URINE MICROSCOPIC-ADD ON

## 2013-03-08 LAB — CBC WITH DIFFERENTIAL/PLATELET
Basophils Absolute: 0 10*3/uL (ref 0.0–0.1)
Basophils Relative: 0 % (ref 0–1)
Eosinophils Absolute: 0 10*3/uL (ref 0.0–0.7)
Hemoglobin: 12 g/dL (ref 12.0–15.0)
Lymphs Abs: 1.3 10*3/uL (ref 0.7–4.0)
MCH: 34 pg (ref 26.0–34.0)
MCHC: 34.5 g/dL (ref 30.0–36.0)
Monocytes Relative: 16 % — ABNORMAL HIGH (ref 3–12)
Neutro Abs: 7 10*3/uL (ref 1.7–7.7)
Neutrophils Relative %: 70 % (ref 43–77)
Platelets: 211 10*3/uL (ref 150–400)
RDW: 12.7 % (ref 11.5–15.5)

## 2013-03-08 LAB — PHOSPHORUS: Phosphorus: 3.4 mg/dL (ref 2.3–4.6)

## 2013-03-08 LAB — PROTIME-INR: INR: 1.01 (ref 0.00–1.49)

## 2013-03-08 LAB — CG4 I-STAT (LACTIC ACID): Lactic Acid, Venous: 1.65 mmol/L (ref 0.5–2.2)

## 2013-03-08 MED ORDER — CALCIUM CARBONATE-VITAMIN D 500-200 MG-UNIT PO TABS
1.0000 | ORAL_TABLET | Freq: Two times a day (BID) | ORAL | Status: DC
  Administered 2013-03-09 – 2013-03-13 (×8): 1 via ORAL
  Filled 2013-03-08 (×11): qty 1

## 2013-03-08 MED ORDER — VANCOMYCIN HCL IN DEXTROSE 1-5 GM/200ML-% IV SOLN
1000.0000 mg | Freq: Once | INTRAVENOUS | Status: AC
  Administered 2013-03-08: 1000 mg via INTRAVENOUS
  Filled 2013-03-08: qty 200

## 2013-03-08 MED ORDER — VITAMIN D 1000 UNITS PO TABS
2000.0000 [IU] | ORAL_TABLET | Freq: Every day | ORAL | Status: DC
  Filled 2013-03-08: qty 2

## 2013-03-08 MED ORDER — OXYCODONE-ACETAMINOPHEN 10-325 MG PO TABS: 1.0000 | ORAL_TABLET | Freq: Three times a day (TID) | ORAL | Status: DC

## 2013-03-08 MED ORDER — PIPERACILLIN-TAZOBACTAM 3.375 G IVPB 30 MIN
3.3750 g | Freq: Once | INTRAVENOUS | Status: AC
  Administered 2013-03-08: 3.375 g via INTRAVENOUS
  Filled 2013-03-08: qty 50

## 2013-03-08 MED ORDER — ADULT MULTIVITAMIN W/MINERALS CH
1.0000 | ORAL_TABLET | Freq: Every day | ORAL | Status: DC
  Administered 2013-03-09 – 2013-03-13 (×5): 1 via ORAL
  Filled 2013-03-08 (×5): qty 1

## 2013-03-08 MED ORDER — IPRATROPIUM BROMIDE 0.02 % IN SOLN: 0.5000 mg | RESPIRATORY_TRACT | Status: DC | PRN

## 2013-03-08 MED ORDER — TEMAZEPAM 7.5 MG PO CAPS
7.5000 mg | ORAL_CAPSULE | Freq: Every evening | ORAL | Status: DC | PRN
  Administered 2013-03-10 – 2013-03-12 (×2): 7.5 mg via ORAL
  Filled 2013-03-08 (×2): qty 1

## 2013-03-08 MED ORDER — CARVEDILOL 12.5 MG PO TABS
12.5000 mg | ORAL_TABLET | Freq: Two times a day (BID) | ORAL | Status: DC
  Administered 2013-03-09 – 2013-03-13 (×9): 12.5 mg via ORAL
  Filled 2013-03-08 (×11): qty 1

## 2013-03-08 MED ORDER — SODIUM CHLORIDE 0.9 % IV SOLN: 1000.0000 mL | Freq: Once | INTRAVENOUS | Status: DC

## 2013-03-08 MED ORDER — ACETAMINOPHEN 650 MG RE SUPP
650.0000 mg | Freq: Four times a day (QID) | RECTAL | Status: DC | PRN
  Filled 2013-03-08: qty 1

## 2013-03-08 MED ORDER — OXYCODONE HCL 5 MG PO TABS: 5.0000 mg | ORAL_TABLET | Freq: Three times a day (TID) | ORAL | Status: DC

## 2013-03-08 MED ORDER — HALOPERIDOL 1 MG PO TABS
1.0000 mg | ORAL_TABLET | Freq: Every day | ORAL | Status: DC
  Administered 2013-03-09 – 2013-03-12 (×3): 1 mg via ORAL
  Filled 2013-03-08 (×6): qty 1

## 2013-03-08 MED ORDER — ACETAMINOPHEN 325 MG PO TABS: 650.0000 mg | ORAL_TABLET | Freq: Four times a day (QID) | ORAL | Status: DC | PRN

## 2013-03-08 MED ORDER — ONDANSETRON HCL 4 MG PO TABS: 4.0000 mg | ORAL_TABLET | Freq: Four times a day (QID) | ORAL | Status: DC | PRN

## 2013-03-08 MED ORDER — OXYCODONE-ACETAMINOPHEN 5-325 MG PO TABS: 1.0000 | ORAL_TABLET | Freq: Three times a day (TID) | ORAL | Status: DC

## 2013-03-08 MED ORDER — POTASSIUM CHLORIDE ER 10 MEQ PO TBCR
20.0000 meq | EXTENDED_RELEASE_TABLET | Freq: Every day | ORAL | Status: DC
  Administered 2013-03-09 – 2013-03-13 (×5): 20 meq via ORAL
  Filled 2013-03-08 (×6): qty 2

## 2013-03-08 MED ORDER — VITAMIN D3 25 MCG (1000 UNIT) PO TABS
2000.0000 [IU] | ORAL_TABLET | Freq: Every day | ORAL | Status: DC
  Administered 2013-03-09 – 2013-03-13 (×5): 2000 [IU] via ORAL
  Filled 2013-03-08 (×5): qty 2

## 2013-03-08 MED ORDER — ONDANSETRON HCL 4 MG/2ML IJ SOLN: 4.0000 mg | Freq: Four times a day (QID) | INTRAMUSCULAR | Status: DC | PRN

## 2013-03-08 MED ORDER — HYDRALAZINE HCL 25 MG PO TABS
25.0000 mg | ORAL_TABLET | Freq: Three times a day (TID) | ORAL | Status: DC
  Administered 2013-03-09 – 2013-03-13 (×9): 25 mg via ORAL
  Filled 2013-03-08 (×17): qty 1

## 2013-03-08 MED ORDER — PIPERACILLIN-TAZOBACTAM 3.375 G IVPB
3.3750 g | Freq: Three times a day (TID) | INTRAVENOUS | Status: DC
  Administered 2013-03-09 – 2013-03-11 (×8): 3.375 g via INTRAVENOUS
  Filled 2013-03-08 (×9): qty 50

## 2013-03-08 MED ORDER — ASPIRIN EC 81 MG PO TBEC
81.0000 mg | DELAYED_RELEASE_TABLET | Freq: Every day | ORAL | Status: DC
  Administered 2013-03-09 – 2013-03-13 (×5): 81 mg via ORAL
  Filled 2013-03-08 (×6): qty 1

## 2013-03-08 MED ORDER — SODIUM CHLORIDE 0.9 % IV SOLN
INTRAVENOUS | Status: DC
  Administered 2013-03-09: 75 mL via INTRAVENOUS
  Administered 2013-03-11: 07:00:00 via INTRAVENOUS
  Administered 2013-03-11: 50 mL via INTRAVENOUS

## 2013-03-08 MED ORDER — SODIUM CHLORIDE 0.9 % IV SOLN
1000.0000 mL | INTRAVENOUS | Status: DC
  Administered 2013-03-08: 1000 mL via INTRAVENOUS

## 2013-03-08 MED ORDER — VANCOMYCIN HCL 500 MG IV SOLR: 500.0000 mg | INTRAVENOUS | Status: DC

## 2013-03-08 MED ORDER — ALBUTEROL SULFATE (2.5 MG/3ML) 0.083% IN NEBU: 2.5000 mg | INHALATION_SOLUTION | RESPIRATORY_TRACT | Status: DC | PRN

## 2013-03-08 NOTE — Progress Notes (Signed)
ANTIBIOTIC CONSULT NOTE - INITIAL  Pharmacy Consult for Vancomycin and Zosyn Indication: rule out sepsis  No Known Allergies  Patient Measurements: Wt ~50kg on 03/02/13  Vital Signs: Temp: 102.1 F (38.9 C) (12/28 1320) Temp src: Rectal (12/28 1320) BP: 131/45 mmHg (12/28 1302) Pulse Rate: 64 (12/28 1302) Intake/Output from previous day:   Intake/Output from this shift:    Labs:  Recent Labs  03/08/13 1435  WBC 9.9  HGB 12.0  PLT 211  CREATININE 1.36*   CrCl 25 ml/min ([place]-Gault), 36 ml/min/1.9m2 (normalized)  No results found for this basename: VANCOTROUGH, VANCOPEAK, VANCORANDOM, GENTTROUGH, GENTPEAK, GENTRANDOM, TOBRATROUGH, TOBRAPEAK, TOBRARND, AMIKACINPEAK, AMIKACINTROU, AMIKACIN,  in the last 72 hours   Microbiology: No results found for this or any previous visit (from the past 720 hour(s)). 12/28 blood x2: 12/28 urine:  Medical History: Past Medical History  Diagnosis Date  . Arthritis   . Hyperlipidemia   . Hypertension   . Osteoporosis   . Sick sinus syndrome     Medications:  Scheduled:   Infusions:  . sodium chloride     Followed by  . sodium chloride     Followed by  . sodium chloride 1,000 mL (03/08/13 1443)  . vancomycin 1,000 mg (03/08/13 1524)   Assessment: 79 yo female with advanced dementia brought by EMS for weakness. Vanc/Zosyn x 1 in ED, the continue per Rx protocol.  Goal of Therapy:  Vancomycin trough level 15-20 mcg/ml  Plan:   Vancomycin 1g IV x 1 in ED, continue with 500mg  IV q24h starting tomorrow Check trough at steady state Zosyn 3.375gm IV q8h (4hr extended infusions) Follow up renal function & cultures   Peggyann Juba, PharmD, BCPS Pager: [phone] 03/08/2013,3:30 PM

## 2013-03-08 NOTE — ED Notes (Signed)
Bed: WA04 Expected date: 03/08/13 Expected time: 12:39 PM Means of arrival: Ambulance Comments: Sick call

## 2013-03-08 NOTE — ED Notes (Signed)
Report called to [employer], RN on [place].

## 2013-03-08 NOTE — ED Notes (Signed)
Family at bedside.  Patient recognized son. Family states she had influenza and pneumococcal immunizations on Monday.

## 2013-03-08 NOTE — ED Notes (Signed)
Pt arrived via EMS. C/o 1 day hx of weakness.  Family states incoherent per  EMS.  Pt not responding to noxius stimuli (rectal temp). Awake, alert.

## 2013-03-08 NOTE — H&P (Addendum)
[facility] History and Physical  [name] [id number] DOB: April 12, 1928 DOA: 03/08/2013  Referring physician: ER physician PCP: Eulas Post, MD   Chief Complaint: weakness  HPI:  79 year old female with past medical history of hypertension, osteoporosis and related chronic pain, depression who presented to [facility] ED 03/08/2013 with worsening weakness and poor oral intake. No associated nausea or vomiting. No chest pain, palpitations, shortness of breath. No reports of blood in urine or stool. No lightheadedness or loss of consciousness. In ED, BP was 131/45, RR was 22, HR was 64, T max was 103 F. Blood work revealed acute renal failure with creatinine of 1.36. Pt was found to have UTI on urinalysis report. CXR did not reveal acute cardiopulmonary process.  Assessment and Plan:  Principal Problem:   UTI (lower urinary tract infection) - pt started on broad spectrum antibiotics due to high fever - narrow down as blood work available: blood cultures, urine culture - for now continue vanco and zosyn Active Problems:   Fever - on broad spectrum antibiotics - follow up blood and urine culture results - follow up influenza, legionella and strep pneumoniae results   HYPERTENSION - on coreg, prinizide and hydralazine - would hold prinizide due to acute renal failure   ARF (acute renal failure) - secondary to UTI, dehydration, prinizide - hold prinizide - hydrate with IV fludis   Dementia - stable   Radiological Exams on Admission: Dg Chest Port 1 View 03/08/2013   IMPRESSION: No acute disease.   Electronically Signed   By: Inge Rise M.D.   On: 03/08/2013 14:27    Code Status: Full Family Communication: Pt at bedside Disposition Plan: Admit for further evaluation  Leisa Lenz, MD  [facility] Pager [phone]  Review of Systems:  Constitutional: positive for fever, chills and malaise/fatigue. Negative for diaphoresis.  HENT: Negative for hearing loss,  ear pain, nosebleeds, congestion, sore throat, neck pain, tinnitus and ear discharge.   Eyes: Negative for blurred vision, double vision, photophobia, pain, discharge and redness.  Respiratory: Negative for cough, hemoptysis, sputum production, shortness of breath, wheezing and stridor.   Cardiovascular: Negative for chest pain, palpitations, orthopnea, claudication and leg swelling.  Gastrointestinal: Negative for nausea, vomiting and abdominal pain. Negative for heartburn, constipation, blood in stool and melena.  Genitourinary: Negative for dysuria, urgency, frequency, hematuria and flank pain.  Musculoskeletal: Negative for myalgias, back pain, joint pain and falls.  Skin: Negative for itching and rash.  Neurological: Negative for dizziness and positive for weakness. Negative for tingling, tremors, sensory change, speech change, focal weakness, loss of consciousness and headaches.  Endo/Heme/Allergies: Negative for environmental allergies and polydipsia. Does not bruise/bleed easily.  Psychiatric/Behavioral: Negative for suicidal ideas. The patient is not nervous/anxious.      Past Medical History  Diagnosis Date  . Arthritis   . Hyperlipidemia   . Hypertension   . Osteoporosis   . Sick sinus syndrome    Past Surgical History  Procedure Laterality Date  . Appendectomy    . Vesicovaginal fistula closure w/ tah    . Tonsillectomy    . Abdominal hysterectomy     Social History:  reports that she has quit smoking. She quit smokeless tobacco use about 43 years ago. She reports that she does not drink alcohol or use illicit drugs.  No Known Allergies   Family History  Problem Relation Age of Onset  . Hypertension Mother   . Heart disease Mother   . Diabetes Mother   .  Hypertension Sister   . Heart disease Brother      Prior to Admission medications   Medication Sig Start Date End Date Taking? Authorizing Provider  aspirin 81 MG tablet Take 81 mg by mouth daily.    Yes  Historical Provider, MD  calcium-vitamin D (OSCAL WITH D) 500-200 MG-UNIT per tablet Take 1 tablet by mouth 2 (two) times daily.    Yes Historical Provider, MD  carvedilol (COREG) 12.5 MG tablet Take 12.5 mg by mouth 2 (two) times daily with a meal.   Yes Historical Provider, MD  Cholecalciferol (VITAMIN D) 2000 UNITS CAPS Take 2,000 Units by mouth daily.   Yes Historical Provider, MD  haloperidol (HALDOL) 1 MG tablet take 1 tablet by mouth at bedtime 12/24/12  Yes Eulas Post, MD  hydrALAZINE (APRESOLINE) 25 MG tablet take 1 tablet by mouth twice a day 01/09/13  Yes Eulas Post, MD  lisinopril-hydrochlorothiazide (PRINZIDE,ZESTORETIC) 20-12.5 MG per tablet Take 1 tablet by mouth daily.   Yes Historical Provider, MD  Multiple Vitamin (MULITIVITAMIN WITH MINERALS) TABS Take 1 tablet by mouth daily. Centrum silver   Yes Historical Provider, MD  oxyCODONE-acetaminophen (PERCOCET) 10-325 MG per tablet Take 1 tablet by mouth 3 (three) times daily. 03/02/13  Yes Eulas Post, MD  potassium chloride (K-DUR) 10 MEQ tablet Take 20 mEq by mouth daily. 04/06/11 08/21/14 Yes Marianne L York, PA-C  temazepam (RESTORIL) 15 MG capsule take 1 capsule by mouth at bedtime if needed 09/30/12  Yes Eulas Post, MD   Physical Exam: Filed Vitals:   03/08/13 1320 03/08/13 1322 03/08/13 1642 03/08/13 1713  BP:   136/50 144/69  Pulse:   64 65  Temp: 102.1 F (38.9 C)  101.1 F (38.4 C) 100.2 F (37.9 C)  TempSrc: Rectal  Rectal Oral  Resp:   22 20  SpO2:  94%  96%    Physical Exam  Constitutional: no distress, appear weak  HENT: Normocephalic. External right and left ear normal. Dry mucus membranes  Eyes: Conjunctivae and EOM are normal. PERRLA, no scleral icterus.  Neck: Normal ROM. Neck supple. No JVD. No tracheal deviation.  CVS: RRR, S1/S2 appreciated Pulmonary: Effort and breath sounds normal, no stridor, rhonchi, wheezes, rales.  Abdominal: Soft. BS +,  no distension, tenderness,  rebound or guarding.  Musculoskeletal: Normal range of motion. no tenderness.  Lymphadenopathy: No lymphadenopathy noted, cervical, inguinal. Neuro: Alert. No focal neurologic deficits. Skin: Skin is warm and dry.   Psychiatric: Normal mood and affect. Behavior, judgment, thought content normal.   Labs on Admission:  Basic Metabolic Panel:  Recent Labs Lab 03/02/13 1525 03/08/13 1435  NA 138 136  K 4.3 3.7  CL 103 100  CO2 27 24  GLUCOSE 113* 95  BUN 35* 34*  CREATININE 1.0 1.36*  CALCIUM 10.2 9.2   Liver Function Tests:  Recent Labs Lab 03/02/13 1525 03/08/13 1435  AST 26 33  ALT 49* 35  ALKPHOS 50 50  BILITOT 0.5 0.2*  PROT 7.2 6.7  ALBUMIN 3.8 3.3*   No results found for this basename: LIPASE, AMYLASE,  in the last 168 hours No results found for this basename: AMMONIA,  in the last 168 hours CBC:  Recent Labs Lab 03/08/13 1435  WBC 9.9  NEUTROABS 7.0  HGB 12.0  HCT 34.8*  MCV 98.6  PLT 211   Cardiac Enzymes: No results found for this basename: CKTOTAL, CKMB, CKMBINDEX, TROPONINI,  in the last 168 hours BNP: No components found  with this basename: POCBNP,  CBG: No results found for this basename: GLUCAP,  in the last 168 hours  If 7PM-7AM, please contact night-coverage [email or web address] Password [facility] 03/08/2013, 5:41 PM

## 2013-03-08 NOTE — ED Provider Notes (Signed)
CSN: [id number]     Arrival date & time 03/08/13  1302 History   First MD Initiated Contact with Patient 03/08/13 1328     Chief Complaint  Patient presents with  . Weakness   (Consider location/radiation/quality/duration/timing/severity/associated sxs/prior Treatment) HPI Family reports increasing weakness over the past 24 hours.  Presents the emergency department today with fever 103.  No reported nausea vomiting or diarrhea.  Denies neck stiffness.  No new rash.  Denies urinary frequency.  Cough without shortness of breath.  No other complaints.  Decreased oral intake over the past 24 hours.  Symptoms are mild to moderate in severity   Past Medical History  Diagnosis Date  . Arthritis   . Hyperlipidemia   . Hypertension   . Osteoporosis   . Sick sinus syndrome    Past Surgical History  Procedure Laterality Date  . Appendectomy    . Vesicovaginal fistula closure w/ tah    . Tonsillectomy    . Abdominal hysterectomy     Family History  Problem Relation Age of Onset  . Hypertension Mother   . Heart disease Mother   . Diabetes Mother   . Hypertension Sister   . Heart disease Brother    History  Substance Use Topics  . Smoking status: Former Smoker -- 1.00 packs/day for 25 years  . Smokeless tobacco: Former Systems developer    Quit date: 04/24/1969  . Alcohol Use: No   OB History   Grav Para Term Preterm Abortions TAB SAB Ect Mult Living                 Review of Systems  All other systems reviewed and are negative.    Allergies  Review of patient's allergies indicates no known allergies.  Home Medications  No current outpatient prescriptions on file. BP 144/69  Pulse 65  Temp(Src) 100.2 F (37.9 C) (Oral)  Resp 20  SpO2 96% Physical Exam  Nursing note and vitals reviewed. Constitutional: She is oriented to person, place, and time. She appears well-developed and well-nourished. No distress.  HENT:  Head: Normocephalic and atraumatic.  Eyes: EOM are normal.   Neck: Normal range of motion.  Cardiovascular: Normal rate, regular rhythm and normal heart sounds.   Pulmonary/Chest: Effort normal and breath sounds normal.  Abdominal: Soft. She exhibits no distension. There is no tenderness.  Musculoskeletal: Normal range of motion.  Neurological: She is alert and oriented to person, place, and time.  Skin: Skin is warm and dry.  Psychiatric: She has a normal mood and affect. Judgment normal.    ED Course  Procedures (including critical care time) Labs Review Labs Reviewed  CBC WITH DIFFERENTIAL - Abnormal; Notable for the following:    RBC 3.53 (*)    HCT 34.8 (*)    Monocytes Relative 16 (*)    Monocytes Absolute 1.6 (*)    All other components within normal limits  COMPREHENSIVE METABOLIC PANEL - Abnormal; Notable for the following:    BUN 34 (*)    Creatinine, Ser 1.36 (*)    Albumin 3.3 (*)    Total Bilirubin 0.2 (*)    GFR calc non Af Amer 35 (*)    GFR calc Af Amer 41 (*)    All other components within normal limits  URINALYSIS, ROUTINE W REFLEX MICROSCOPIC - Abnormal; Notable for the following:    APPearance CLOUDY (*)    Hgb urine dipstick SMALL (*)    Protein, ur 100 (*)    Nitrite POSITIVE (*)  Leukocytes, UA TRACE (*)    All other components within normal limits  URINE MICROSCOPIC-ADD ON - Abnormal; Notable for the following:    Bacteria, UA MANY (*)    All other components within normal limits  CULTURE, BLOOD (ROUTINE X 2)  CULTURE, BLOOD (ROUTINE X 2)  URINE CULTURE  PROCALCITONIN  PROTIME-INR  CG4 I-STAT (LACTIC ACID)   Imaging Review Dg Chest Port 1 View  03/08/2013   CLINICAL DATA:  Weakness.  Confusion.  EXAM: PORTABLE CHEST - 1 VIEW  COMPARISON:  Plain film the chest 04/01/2011 and CT chest 04/01/2011.  FINDINGS: The lungs are clear. Heart size is upper normal. No pneumothorax or pleural fluid. Pacing device noted.  IMPRESSION: No acute disease.   Electronically Signed   By: Inge Rise M.D.   On:  03/08/2013 14:27  I personally reviewed the imaging tests through PACS system I reviewed available ER/hospitalization records through the EMR   EKG Interpretation    Date/Time:  Sunday March 08 2013 13:23:44 EST Ventricular Rate:  66 PR Interval:  84 QRS Duration: 109 QT Interval:  388 QTC Calculation: 406 R Axis:   75 Text Interpretation:  Sinus or ectopic atrial rhythm Short PR interval Low voltage, extremity leads No significant change was found Confirmed by Adeja Sarratt  MD, Brenlynn Fake ([id number]) on 03/08/2013 1:34:00 PM            MDM   1. Pyelonephritis   2. Fever    Febrile with altered mental status.  Likely pyelonephritis.  Vital signs normalizing.  Lactate normal.  Admit to MedSurg.    Hoy Morn, MD 03/08/13 [phone]

## 2013-03-09 ENCOUNTER — Encounter (HOSPITAL_COMMUNITY): Payer: Self-pay

## 2013-03-09 DIAGNOSIS — N179 Acute kidney failure, unspecified: Secondary | ICD-10-CM | POA: Diagnosis not present

## 2013-03-09 DIAGNOSIS — Z950 Presence of cardiac pacemaker: Secondary | ICD-10-CM | POA: Diagnosis not present

## 2013-03-09 DIAGNOSIS — N12 Tubulo-interstitial nephritis, not specified as acute or chronic: Secondary | ICD-10-CM | POA: Diagnosis not present

## 2013-03-09 LAB — GLUCOSE, CAPILLARY: Glucose-Capillary: 75 mg/dL (ref 70–99)

## 2013-03-09 LAB — CBC
HCT: 30.9 % — ABNORMAL LOW (ref 36.0–46.0)
Hemoglobin: 10.5 g/dL — ABNORMAL LOW (ref 12.0–15.0)
MCH: 33 pg (ref 26.0–34.0)
MCHC: 34 g/dL (ref 30.0–36.0)
Platelets: 189 10*3/uL (ref 150–400)
RDW: 12.6 % (ref 11.5–15.5)

## 2013-03-09 LAB — COMPREHENSIVE METABOLIC PANEL
ALT: 33 U/L (ref 0–35)
AST: 46 U/L — ABNORMAL HIGH (ref 0–37)
Alkaline Phosphatase: 36 U/L — ABNORMAL LOW (ref 39–117)
CO2: 22 mEq/L (ref 19–32)
Calcium: 8 mg/dL — ABNORMAL LOW (ref 8.4–10.5)
GFR calc non Af Amer: 45 mL/min — ABNORMAL LOW (ref >90)
Glucose, Bld: 86 mg/dL (ref 70–99)
Potassium: 3.8 mEq/L (ref 3.5–5.1)
Sodium: 136 mEq/L (ref 135–145)

## 2013-03-09 LAB — INFLUENZA PANEL BY PCR (TYPE A & B)
H1N1 flu by pcr: NOT DETECTED
Influenza B By PCR: NEGATIVE

## 2013-03-09 LAB — MRSA PCR SCREENING: MRSA by PCR: NEGATIVE

## 2013-03-09 MED ORDER — OXYCODONE HCL 5 MG PO TABS
5.0000 mg | ORAL_TABLET | Freq: Four times a day (QID) | ORAL | Status: DC | PRN
  Administered 2013-03-10 – 2013-03-12 (×4): 5 mg via ORAL
  Filled 2013-03-09 (×4): qty 1

## 2013-03-09 MED ORDER — VANCOMYCIN HCL IN DEXTROSE 750-5 MG/150ML-% IV SOLN
750.0000 mg | INTRAVENOUS | Status: DC
  Administered 2013-03-09 – 2013-03-10 (×2): 750 mg via INTRAVENOUS
  Filled 2013-03-09 (×2): qty 150

## 2013-03-09 MED ORDER — OSELTAMIVIR PHOSPHATE 30 MG PO CAPS
30.0000 mg | ORAL_CAPSULE | Freq: Every day | ORAL | Status: DC
  Administered 2013-03-09 – 2013-03-10 (×2): 30 mg via ORAL
  Filled 2013-03-09 (×2): qty 1

## 2013-03-09 NOTE — Progress Notes (Signed)
Influenza A positive, placed on Tamiflu.   Faye Ramsay, MD  [facility] Pager [phone] Cell [phone]  If 7PM-7AM, please contact night-coverage [email or web address] Password [email or web address]

## 2013-03-09 NOTE — Clinical Social Work Placement (Signed)
      Clinical Social Work Department CLINICAL SOCIAL WORK PLACEMENT NOTE 03/09/2013  Patient:  Heather Jarvis, Heather Jarvis  Account Number:  [id number] Admit date:  03/08/2013  Clinical Social Worker:  Caren Hazy, LCSW  Date/time:  03/09/2013 12:00 M  Clinical Social Work is seeking post-discharge placement for this patient at the following level of care:   [place]   (*CSW will update this form in Epic as items are completed)   03/09/2013  Patient/family provided with [place] Department of Clinical Social Works list of facilities offering this level of care within the geographic area requested by the patient (or if unable, by the patients family).  03/09/2013  Patient/family informed of their freedom to choose among providers that offer the needed level of care, that participate in Medicare, Medicaid or managed care program needed by the patient, have an available bed and are willing to accept the patient.  03/09/2013  Patient/family informed of [facility] ownership interest in [facility], as well as of the fact that they are under no obligation to receive care at this facility.  PASARR submitted to EDS on 03/09/2013 PASARR number received from EDS on 03/09/2013  FL2 transmitted to all facilities in geographic area requested by pt/family on  03/09/2013 FL2 transmitted to all facilities within larger geographic area on 03/09/2013  Patient informed that his/her managed care company has contracts with or will negotiate with  certain facilities, including the following:     Patient/family informed of bed offers received:   Patient chooses bed at  Physician recommends and patient chooses bed at    Patient to be transferred to  on   Patient to be transferred to facility by   The following physician request were entered in Epic:   Additional Comments:

## 2013-03-09 NOTE — Progress Notes (Signed)
Spoke with patient's daughter, Gasper Lloyd, who states her mother has not spoken in the last day and lives with her and is normally very talkative.  She verbalized that she would like to speak with the doctor when she arrives for visit

## 2013-03-09 NOTE — Progress Notes (Signed)
Nutrition Brief Note  Patient identified on the Malnutrition Screening Tool (MST) Report  Wt Readings from Last 3 Encounters:  03/02/13 112 lb (50.803 kg)  01/22/13 115 lb (52.164 kg)  08/20/12 121 lb (54.885 kg)    BMI of 21.8 kg/(m^2) based on most recent weight. Patient meets criteria for normal weight based on current BMI.   Current diet order is dysphagia 3, thin liquids, patient working on lunch during visit. Labs and medications reviewed. BUN/Cr elevated with low GFR. AST elevated. Met with pt and daughter. Daughter reports pt eating well at home, 3 meals/day with good appetite. Poor intake started yesterday during which time daughter had to assist pt with eating, typically pt has no problems with this. Pt ate 50% of meal this morning per daughter's report. Reports usual body weight of 112 pounds. Denies that pt had any problems chewing/swallowing at home and was not on any nutritional supplements.   No nutrition interventions warranted at this time. If nutrition issues arise, please consult RD.   Mikey College MS, [place], [place] Pager [phone] After Hours Pager

## 2013-03-09 NOTE — Care Management Note (Signed)
    Page 1 of 1   03/09/2013     1:21:46 PM   CARE MANAGEMENT NOTE 03/09/2013  Patient:  Heather Jarvis, Heather Jarvis   Account Number:  [id number]  Date Initiated:  03/09/2013  Documentation initiated by:  Sherrin Daisy  Subjective/Objective Assessment:   dx acute renal failure, pyelonephritis     Action/Plan:   SNF   Anticipated DC Date:  03/11/2013   Anticipated DC Plan:  SKILLED NURSING FACILITY  In-house referral  Clinical Social Worker      DC Planning Services  CM consult      Choice offered to / List presented to:             Status of service:  Completed, signed off Medicare Important Message given?   (If response is "NO", the following Medicare IM given date fields will be blank) Date Medicare IM given:   Date Additional Medicare IM given:    Discharge Disposition:    Per UR Regulation:  Reviewed for med. necessity/level of care/duration of stay  If discussed at [place] of Stay Meetings, dates discussed:    Comments:

## 2013-03-09 NOTE — Clinical Social Work Psychosocial (Signed)
     Clinical Social Work Department BRIEF PSYCHOSOCIAL ASSESSMENT 03/09/2013  Patient:  Heather Jarvis, Heather Jarvis     Account Number:  [id number]     Admit date:  03/08/2013  Clinical Social Worker:  Venia Minks  Date/Time:  03/09/2013 12:00 M  Referred by:  Physician  Date Referred:  03/09/2013 Referred for  SNF Placement   Other Referral:   Interview type:  Family Other interview type:    PSYCHOSOCIAL DATA Living Status:  FAMILY Admitted from facility:   Level of care:   Primary support name:  Gasper Lloyd Primary support relationship to patient:  CHILD, ADULT Degree of support available:   good    CURRENT CONCERNS Current Concerns  Post-Acute Placement   Other Concerns:    SOCIAL WORK ASSESSMENT / PLAN CSW met with patient and patient's daughter at bedside. patient is alert and appears oriented but has diagnosis of dementia and appears to be staring into space a bit. Patient is in the hospital with Flu and UTI. daughter states that patient lives with her and patient's son. Discussed need for snf placement. Heather Jarvis is agreeable to same. She would like something as close to where they live as possible.   Assessment/plan status:   Other assessment/ plan:   Information/referral to community resources:    PATIENTS/FAMILYS RESPONSE TO PLAN OF CARE: Heather Jarvis is supportive of patient's care plan. She states that patient is not usually this quiet or confused. patient is usually more talkative. CSW offered emotional support and stated that both UTI and the flu can take a lot out of a person and hopefully she will start to fee better.

## 2013-03-09 NOTE — Progress Notes (Signed)
This shift pt  Alert but  Unresponsive. Answered only to questions regarding incontinence and room temp. Refused all meds.

## 2013-03-09 NOTE — Progress Notes (Signed)
ANTIBIOTIC CONSULT NOTE - Follow Up  Pharmacy Consult for Vancomycin and Zosyn Indication: rule out sepsis  No Known Allergies  Patient Measurements: Wt ~50kg on 03/02/13  Vital Signs: Temp: 99.7 F (37.6 C) (12/29 0610) Temp src: Axillary (12/29 0610) BP: 135/55 mmHg (12/29 0610) Pulse Rate: 65 (12/29 0610)  Labs:  Recent Labs  03/08/13 1435 03/09/13 0550  WBC 9.9 7.5  HGB 12.0 10.5*  PLT 211 189  CREATININE 1.36* 1.11*    Microbiology: 12/28 blood x2: sent 12/28 urine: sent 12/28 Legionella urine antigen: ordered 12/28 Strep pneumoniae urine antigen: ordered 12/28 Influenza panel PCR: in process 11/25 MRSA PCR (+) - tested prior to cath 11/25  Assessment: 79 yo female with advanced dementia brought by EMS for weakness. Vanc/Zosyn x 1 in ED, then continue per Rx protocol on admission for empiric treatment of UTI and presumed sepsis.  12/28 >> Zosyn >> 12/28 >> Vancomycin >>  Tmax24h: 100.2 WBC: WNL Renal: improved from admission.  SCr 1.36 > 1.11, CrCl~ 44 ml/min/1.70m2 (normalized), CrCl ~30 ml/min (CG)  Goal of Therapy:  Vancomycin trough level 15-20 mcg/ml  Plan:  1.  Increase vancomycin dose to 750 mg IV q24h due to improved SCr. 2.  Continue Zosyn 3.375g IV q8h extended interval dosing. 3.  Follow up culture results, SCr, trough levels as needed.  Hershal Coria, PharmD, BCPS Pager: [phone] 03/09/2013 8:13 AM

## 2013-03-09 NOTE — Evaluation (Signed)
Physical Therapy Evaluation Patient Details Name: Heather Jarvis MRN: [id number] DOB: Apr 14, 1928 Today's Date: 03/09/2013 Time: [id number] PT Time Calculation (min): 15 min  PT Assessment / Plan / Recommendation History of Present Illness  79 year old female with past medical history of hypertension, osteoporosis and related chronic pain, depression who presented to [facility] ED 03/08/2013 with worsening weakness and poor oral intake. No associated nausea or vomiting. No chest pain, palpitations, shortness of breath. No reports of blood in urine or stool. No lightheadedness or loss of consciousness.  Clinical Impression  Pt admitted with UTI and fever. Pt currently with functional limitations due to the deficits listed below (see PT Problem List).  Pt will benefit from skilled PT to increase their independence and safety with mobility to allow discharge to the venue listed below.  Daughter reports pt has hx of dementia however usually more talkative and supervision for mobility.  Pt inconsistent at following commands so only assisted to recliner today.  Recommend ST-SNF unless family can provide assist at home.  Will continue to update d/c recommendations as it is very likely that once cognition improves, mobility will also improve.     PT Assessment  Patient needs continued PT services    Follow Up Recommendations  SNF    Does the patient have the potential to tolerate intense rehabilitation      Barriers to Discharge        Equipment Recommendations  None recommended by PT    Recommendations for Other Services     Frequency Min 3X/week    Precautions / Restrictions Precautions Precautions: Fall Restrictions Weight Bearing Restrictions: No   Pertinent Vitals/Pain No signs of distress, repositioned      Mobility  Bed Mobility Bed Mobility: Supine to Sit Supine to Sit: 3: Mod assist;HOB elevated Details for Bed Mobility Assistance: verbal and tactile cues to technique, assist for  initiating LEs and for trunk upright Transfers Transfers: Sit to Stand;Stand to Sit;Stand Pivot Transfers Sit to Stand: 1: +2 Total assist;From bed Sit to Stand: Patient Percentage: 60% Stand to Sit: 1: +2 Total assist;To chair/3-in-1 Stand to Sit: Patient Percentage: 70% Stand Pivot Transfers: 1: +2 Total assist Stand Pivot Transfers: Patient Percentage: 70% Details for Transfer Assistance: verbal cues for technique, assisted to standing and then pt took a few steps over to recliner, placed hand on armrest and pt able to assist with controlling descent Ambulation/Gait Ambulation/Gait Assistance: Not tested (comment)    Exercises     PT Diagnosis: Difficulty walking;Generalized weakness  PT Problem List: Decreased strength;Decreased balance;Decreased activity tolerance;Decreased cognition;Decreased knowledge of use of DME;Decreased mobility PT Treatment Interventions: DME instruction;Gait training;Functional mobility training;Therapeutic activities;Therapeutic exercise;Patient/family education     PT Goals(Current goals can be found in the care plan section) Acute Rehab PT Goals PT Goal Formulation: With patient/family Time For Goal Achievement: 03/16/13 Potential to Achieve Goals: Good  Visit Information  Last PT Received On: 03/09/13 Assistance Needed: +2 History of Present Illness: 79 year old female with past medical history of hypertension, osteoporosis and related chronic pain, depression who presented to [facility] ED 03/08/2013 with worsening weakness and poor oral intake. No associated nausea or vomiting. No chest pain, palpitations, shortness of breath. No reports of blood in urine or stool. No lightheadedness or loss of consciousness.       Prior [place] expects to be discharged to:: Private residence Living Arrangements: Children Type of Home: [place]: One level Home Equipment: Environmental consultant - 4 wheels Prior  Function Level of  Independence: Needs assistance Gait / Transfers Assistance Needed: supervision - daughter states someone is always home with pt ADL's / Homemaking Assistance Needed: daughter reports she assists pt with bathing Communication Communication:  (pt not very talkative - not baseline per daughter)    Cognition  Cognition Arousal/Alertness: Awake/alert Behavior During Therapy: Flat affect Overall Cognitive Status: Impaired/Different from baseline General Comments: daughter reports pt usually more talkative, only said a couple words while therapist in room, would not shake head yes/no however able to follow other commands    Extremity/Trunk Assessment Lower Extremity Assessment Lower Extremity Assessment: Generalized weakness   Balance    End of Session PT - End of Session Activity Tolerance: Other (comment) (limited by cognition) Patient left: in chair;with call bell/phone within reach;with family/visitor present Nurse Communication: Mobility status (pt up in recliner with daughter in room)  GP     Heather Jarvis,Heather Jarvis 03/09/2013, 12:39 PM Heather Jarvis, PT, DPT 03/09/2013 Pager: [phone]

## 2013-03-09 NOTE — Progress Notes (Signed)
Patient more engaging in conversation, states she wants apple juice, drank two cups, dinner ordered.

## 2013-03-09 NOTE — Progress Notes (Signed)
Patient ID: Heather Jarvis, female   DOB: 30-Dec-1928, 79 y.o.   MRN: [id number] [facility] PROGRESS NOTE  Heather Jarvis [id number] DOB: August 11, 1928 DOA: 03/08/2013 PCP: Eulas Post, MD  Brief narrative: 79 year old female with past medical history of hypertension, osteoporosis and related chronic pain, depression who presented to [facility] ED 03/08/2013 with worsening weakness and poor oral intake. No associated nausea or vomiting. No chest pain, palpitations, shortness of breath. No reports of blood in urine or stool. No lightheadedness or loss of consciousness.  In ED, BP was 131/45, RR was 22, HR was 64, T max was 103 F. Blood work revealed acute renal failure with creatinine of 1.36. Pt was found to have UTI on urinalysis report. CXR did not reveal acute cardiopulmonary process.   Assessment and Plan:  Principal Problem:  UTI (lower urinary tract infection)  - pt started on broad spectrum antibiotics due to high fever  - narrow down as blood work available: blood cultures, urine culture  - for now continue vanco and zosyn  Active Problems:  Fever  - on broad spectrum antibiotics  - follow up blood and urine culture results  - follow up influenza, legionella and strep pneumoniae results  HYPERTENSION  - on coreg, prinizide and hydralazine  - hold prinizide due to acute renal failure  ARF (acute renal failure)  - secondary to UTI, dehydration, prinizide  - hold prinizide, Cr trending down - continue IVF and repeat BMP in AM Dementia  - will re evaluate once pt is more alert and able to follow some commands   Consultants:  None  Procedures/Studies: Dg Chest Port 1 View   03/08/2013  No acute disease.     Antibiotics:  Vancomycin 12/28 -->  Zosyn 12/28 -->  Code Status: Full Family Communication: No family at bedside Disposition Plan: PT evaluation pending   HPI/Subjective: No events overnight.   Objective: Filed Vitals:   03/08/13 1322 03/08/13 1642 03/08/13  1713 03/08/13 2139  BP:  136/50 144/69 144/51  Pulse:  64 65 70  Temp:  101.1 F (38.4 C) 100.2 F (37.9 C) 100.2 F (37.9 C)  TempSrc:  Rectal Oral Oral  Resp:  22 20 20   SpO2: 94%  96% 95%    Intake/Output Summary (Last 24 hours) at 03/09/13 0541 Last data filed at 03/09/13 0200  Gross per 24 hour  Intake      0 ml  Output      0 ml  Net      0 ml    Exam:   General:  Pt is somnolent, not in acute distress, follows some commands appropriately   Cardiovascular: Regular rate and rhythm, S1/S2, no murmurs, no rubs, no gallops  Respiratory: Clear to auscultation bilaterally, no wheezing, diminished breath sounds at bases   Abdomen: Soft, non tender, non distended, bowel sounds present, no guarding  Extremities: No edema, pulses DP and PT palpable bilaterally  Data Reviewed: Basic Metabolic Panel:  Recent Labs Lab 03/02/13 1525 03/08/13 1435  NA 138 136  K 4.3 3.7  CL 103 100  CO2 27 24  GLUCOSE 113* 95  BUN 35* 34*  CREATININE 1.0 1.36*  CALCIUM 10.2 9.2   Liver Function Tests:  Recent Labs Lab 03/02/13 1525 03/08/13 1435  AST 26 33  ALT 49* 35  ALKPHOS 50 50  BILITOT 0.5 0.2*  PROT 7.2 6.7  ALBUMIN 3.8 3.3*   CBC:  Recent Labs Lab 03/08/13 1435  WBC 9.9  NEUTROABS 7.0  HGB 12.0  HCT 34.8*  MCV 98.6  PLT 211   Scheduled Meds: . sodium chloride  1,000 mL Intravenous Once   Followed by  . sodium chloride  1,000 mL Intravenous Once  . aspirin EC  81 mg Oral Daily  . calcium-vitamin D  1 tablet Oral BID  . carvedilol  12.5 mg Oral BID WC  . cholecalciferol  2,000 Units Oral Daily  . haloperidol  1 mg Oral QHS  . hydrALAZINE  25 mg Oral Q8H  . multivitamin with minerals  1 tablet Oral Daily  . oxyCODONE-acetaminophen  1 tablet Oral TID   And  . oxyCODONE  5 mg Oral TID  . piperacillin-tazobactam (ZOSYN)  IV  3.375 g Intravenous Q8H  . potassium chloride  20 mEq Oral Daily  . vancomycin  500 mg Intravenous Q24H   Continuous  Infusions: . sodium chloride 1,000 mL (03/08/13 1443)  . sodium chloride     Faye Ramsay, MD  [facility] Pager [phone]  If 7PM-7AM, please contact night-coverage [email or web address] Password [email or web address] 03/09/2013, 5:41 AM   LOS: 1 day

## 2013-03-10 DIAGNOSIS — Z950 Presence of cardiac pacemaker: Secondary | ICD-10-CM | POA: Diagnosis not present

## 2013-03-10 DIAGNOSIS — N179 Acute kidney failure, unspecified: Secondary | ICD-10-CM | POA: Diagnosis not present

## 2013-03-10 DIAGNOSIS — N12 Tubulo-interstitial nephritis, not specified as acute or chronic: Secondary | ICD-10-CM | POA: Diagnosis not present

## 2013-03-10 LAB — BASIC METABOLIC PANEL
CO2: 21 mEq/L (ref 19–32)
Calcium: 8.2 mg/dL — ABNORMAL LOW (ref 8.4–10.5)
Creatinine, Ser: 1.05 mg/dL (ref 0.50–1.10)
GFR calc Af Amer: 56 mL/min — ABNORMAL LOW (ref >90)
GFR calc non Af Amer: 48 mL/min — ABNORMAL LOW (ref >90)
Glucose, Bld: 93 mg/dL (ref 70–99)
Potassium: 3.8 mEq/L (ref 3.7–5.3)
Sodium: 135 mEq/L — ABNORMAL LOW (ref 137–147)

## 2013-03-10 LAB — CBC
Hemoglobin: 11 g/dL — ABNORMAL LOW (ref 12.0–15.0)
MCV: 95.2 fL (ref 78.0–100.0)
Platelets: 187 10*3/uL (ref 150–400)
RBC: 3.33 MIL/uL — ABNORMAL LOW (ref 3.87–5.11)
RDW: 12.2 % (ref 11.5–15.5)
WBC: 6.6 10*3/uL (ref 4.0–10.5)

## 2013-03-10 LAB — GLUCOSE, CAPILLARY: Glucose-Capillary: 108 mg/dL — ABNORMAL HIGH (ref 70–99)

## 2013-03-10 LAB — CLOSTRIDIUM DIFFICILE BY PCR: Toxigenic C. Difficile by PCR: NEGATIVE

## 2013-03-10 MED ORDER — OSELTAMIVIR PHOSPHATE 75 MG PO CAPS
75.0000 mg | ORAL_CAPSULE | Freq: Every day | ORAL | Status: AC
  Administered 2013-03-11 – 2013-03-13 (×3): 75 mg via ORAL
  Filled 2013-03-10 (×3): qty 1

## 2013-03-10 NOTE — Progress Notes (Signed)
Patient ID: Heather Jarvis, female   DOB: 04-19-1928, 79 y.o.   MRN: [id number] [facility] PROGRESS NOTE  Heather Jarvis [id number] DOB: 12-Feb-1929 DOA: 03/08/2013 PCP: Eulas Post, MD  Brief narrative: 79 year old female with past medical history of hypertension, osteoporosis and related chronic pain, depression who presented to [facility] ED 03/08/2013 with worsening weakness and poor oral intake. No associated nausea or vomiting. No chest pain, palpitations, shortness of breath. No reports of blood in urine or stool. No lightheadedness or loss of consciousness.   In ED, BP was 131/45, RR was 22, HR was 64, T max was 103 F. Blood work revealed acute renal failure with creatinine of 1.36. Pt was found to have UTI on urinalysis report. CXR did not reveal acute cardiopulmonary process.   Assessment and Plan:  Principal Problem:  UTI (lower urinary tract infection)  - pt clinically improving and follows commands appropriately this AM - d/c Vancomycin, continue Zosyn day #2 - blood cultures, urine culture final report pending Active Problems:  Fever  - secondary to UTI, influenza A - continue Zosyn and Tamiflu day #2 - follow up blood and urine culture results  - follow up legionella and strep pneumoniae results  HYPERTENSION  - on coreg, prinizide and hydralazine  - hold prinizide due to acute renal failure  ARF (acute renal failure)  - secondary to UTI, dehydration - pt started on IVF, Cr trending down and WNL this AM - continue IVF and repeat BMP in AM  Dementia  - appears to be getting closer to her baseline  Malnutrition - moderate - secondary to acute illness imposed on chronic failure to thrive - nutrition consultation requested   Consultants:  None Procedures/Studies:  Dg Chest Port 1 View 03/08/2013 No acute disease.  Antibiotics/ANtivirals:  Vancomycin 12/28 -->  12/30 Zosyn 12/28 -->   Tamiflu 12/29 -->   Code Status: Full  Family Communication: MIss Gasper Lloyd over the phone  Disposition Plan: PT evaluation once pt more medically stable    HPI/Subjective: No events overnight.   Objective: Filed Vitals:   03/10/13 0616 03/10/13 0700 03/10/13 1319 03/10/13 1329  BP: 100/54  108/45 114/45  Pulse:  64  62  Temp:  98.3 F (36.8 C)  98 F (36.7 C)  TempSrc:  Oral  Oral  Resp:  16  18  Height:      Weight:      SpO2:  95%  98%    Intake/Output Summary (Last 24 hours) at 03/10/13 1834 Last data filed at 03/10/13 0636  Gross per 24 hour  Intake 1578.75 ml  Output      0 ml  Net 1578.75 ml    Exam:   General:  Pt is alert, follows commands appropriately, not in acute distress  Cardiovascular: Regular rate and rhythm, S1/S2, no murmurs, no rubs, no gallops  Respiratory: Clear to auscultation bilaterally, no wheezing, no crackles, no rhonchi  Abdomen: Soft, non tender, non distended, bowel sounds present, no guarding  Extremities: No edema, pulses DP and PT palpable bilaterally  Neuro: Grossly nonfocal  Data Reviewed: Basic Metabolic Panel:  Recent Labs Lab 03/08/13 1435 03/08/13 1912 03/09/13 0550 03/10/13 0557  NA 136  --  136 135*  K 3.7  --  3.8 3.8  CL 100  --  102 103  CO2 24  --  22 21  GLUCOSE 95  --  86 93  BUN 34*  --  27* 26*  CREATININE 1.36*  --  1.11* 1.05  CALCIUM 9.2  --  8.0* 8.2*  MG  --  1.6  --   --   PHOS  --  3.4  --   --    Liver Function Tests:  Recent Labs Lab 03/08/13 1435 03/09/13 0550  AST 33 46*  ALT 35 33  ALKPHOS 50 36*  BILITOT 0.2* 0.2*  PROT 6.7 5.6*  ALBUMIN 3.3* 2.6*   CBC:  Recent Labs Lab 03/08/13 1435 03/09/13 0550 03/10/13 0557  WBC 9.9 7.5 6.6  NEUTROABS 7.0  --   --   HGB 12.0 10.5* 11.0*  HCT 34.8* 30.9* 31.7*  MCV 98.6 97.2 95.2  PLT 211 189 187   CBG:  Recent Labs Lab 03/09/13 0738 03/10/13 0741  GLUCAP 75 108*    Recent Results (from the past 240 hour(s))  CULTURE, BLOOD (ROUTINE X 2)     Status: None   Collection Time    03/08/13   2:35 PM      Result Value Range Status   Specimen Description BLOOD RIGHT ARM   Final   Special Requests BOTTLES DRAWN AEROBIC AND ANAEROBIC 5CC   Final   Culture  Setup Time     Final   Value: 03/08/2013 18:51     Performed at [facility]   Culture     Final   Value:        BLOOD CULTURE RECEIVED NO GROWTH TO DATE CULTURE WILL BE HELD FOR 5 DAYS BEFORE ISSUING A FINAL NEGATIVE REPORT     Performed at [facility]   Report Status PENDING   Incomplete  CULTURE, BLOOD (ROUTINE X 2)     Status: None   Collection Time    03/08/13  2:45 PM      Result Value Range Status   Specimen Description BLOOD RIGHT HAND   Final   Special Requests BOTTLES DRAWN AEROBIC AND ANAEROBIC 3CC   Final   Culture  Setup Time     Final   Value: 03/08/2013 18:51     Performed at [facility]   Culture     Final   Value:        BLOOD CULTURE RECEIVED NO GROWTH TO DATE CULTURE WILL BE HELD FOR 5 DAYS BEFORE ISSUING A FINAL NEGATIVE REPORT     Performed at [facility]   Report Status PENDING   Incomplete  URINE CULTURE     Status: None   Collection Time    03/08/13  3:06 PM      Result Value Range Status   Specimen Description URINE, CLEAN CATCH   Final   Special Requests NONE   Final   Culture  Setup Time     Final   Value: 03/08/2013 21:17     Performed at [place]     Final   Value: >=100,000 COLONIES/ML     Performed at [facility]   Culture     Final   Value: ESCHERICHIA COLI     Performed at [facility]   Report Status PENDING   Incomplete  MRSA PCR SCREENING     Status: None   Collection Time    03/09/13  2:38 PM      Result Value Range Status   MRSA by PCR NEGATIVE  NEGATIVE Final   Comment:            The GeneXpert MRSA Assay (FDA  approved for NASAL specimens     only), is one component of a     comprehensive MRSA colonization     surveillance program. It is not     intended to diagnose MRSA      infection nor to guide or     monitor treatment for     MRSA infections.  CLOSTRIDIUM DIFFICILE BY PCR     Status: None   Collection Time    03/10/13  2:07 PM      Result Value Range Status   C difficile by pcr NEGATIVE  NEGATIVE Final   Comment: Performed at [facility]     Scheduled Meds: . aspirin EC  81 mg Oral Daily  . calcium-vitamin D  1 tablet Oral BID  . carvedilol  12.5 mg Oral BID WC  . cholecalciferol  2,000 Units Oral Daily  . haloperidol  1 mg Oral QHS  . hydrALAZINE  25 mg Oral Q8H  . multivitamin with minerals  1 tablet Oral Daily  . [START ON 03/11/2013] oseltamivir  75 mg Oral Daily  . piperacillin-tazobactam (ZOSYN)  IV  3.375 g Intravenous Q8H  . potassium chloride  20 mEq Oral Daily  . vancomycin  750 mg Intravenous Q24H   Continuous Infusions: . sodium chloride 75 mL (03/09/13 1549)   Faye Ramsay, MD  [facility] Pager [phone]  If 7PM-7AM, please contact night-coverage [email or web address] Password [facility] 03/10/2013, 6:34 PM   LOS: 2 days

## 2013-03-11 DIAGNOSIS — N179 Acute kidney failure, unspecified: Secondary | ICD-10-CM | POA: Diagnosis not present

## 2013-03-11 DIAGNOSIS — D649 Anemia, unspecified: Secondary | ICD-10-CM

## 2013-03-11 DIAGNOSIS — E871 Hypo-osmolality and hyponatremia: Secondary | ICD-10-CM

## 2013-03-11 DIAGNOSIS — F0390 Unspecified dementia without behavioral disturbance: Secondary | ICD-10-CM

## 2013-03-11 DIAGNOSIS — R509 Fever, unspecified: Secondary | ICD-10-CM | POA: Diagnosis not present

## 2013-03-11 DIAGNOSIS — N12 Tubulo-interstitial nephritis, not specified as acute or chronic: Secondary | ICD-10-CM | POA: Diagnosis not present

## 2013-03-11 LAB — CBC
Hemoglobin: 10.5 g/dL — ABNORMAL LOW (ref 12.0–15.0)
MCH: 32.4 pg (ref 26.0–34.0)
MCHC: 33.8 g/dL (ref 30.0–36.0)
Platelets: 174 10*3/uL (ref 150–400)
RBC: 3.24 MIL/uL — ABNORMAL LOW (ref 3.87–5.11)

## 2013-03-11 LAB — URINE CULTURE

## 2013-03-11 LAB — BASIC METABOLIC PANEL
BUN: 23 mg/dL (ref 6–23)
Creatinine, Ser: 1.1 mg/dL (ref 0.50–1.10)
GFR calc Af Amer: 53 mL/min — ABNORMAL LOW (ref >90)
GFR calc non Af Amer: 45 mL/min — ABNORMAL LOW (ref >90)
Glucose, Bld: 103 mg/dL — ABNORMAL HIGH (ref 70–99)
Potassium: 3.9 mEq/L (ref 3.7–5.3)
Sodium: 134 mEq/L — ABNORMAL LOW (ref 137–147)

## 2013-03-11 LAB — GLUCOSE, CAPILLARY: Glucose-Capillary: 89 mg/dL (ref 70–99)

## 2013-03-11 MED ORDER — ENSURE COMPLETE PO LIQD
237.0000 mL | Freq: Two times a day (BID) | ORAL | Status: DC
  Administered 2013-03-11 – 2013-03-12 (×3): 237 mL via ORAL

## 2013-03-11 MED ORDER — SULFAMETHOXAZOLE-TMP DS 800-160 MG PO TABS
1.0000 | ORAL_TABLET | Freq: Two times a day (BID) | ORAL | Status: DC
  Administered 2013-03-11 – 2013-03-13 (×4): 1 via ORAL
  Filled 2013-03-11 (×6): qty 1

## 2013-03-11 MED ORDER — ENSURE COMPLETE PO LIQD: 237.0000 mL | Freq: Two times a day (BID) | ORAL | Status: DC

## 2013-03-11 NOTE — Progress Notes (Signed)
Patient assisted up to recliner, tolerated getting out of bed without issue.  Linens changed, bathed, returned to bed.

## 2013-03-11 NOTE — Progress Notes (Addendum)
[facility] PROGRESS NOTE  Heather Jarvis [id number]:[id number] DOB: 1928-11-20 DOA: 03/08/2013 PCP: Eulas Post, MD  Assessment/Plan  UTI (lower urinary tract infection) due to E. Coli UTI - Convert zosyn to bactrim day #3 of antibiotics - blood cultures NGTD  Influenza with some bronchiolitic wheezing -  Continue tamiflu day #2  Fever  - secondary to UTI, influenza A  - legionella and strep pneumoniae not obtained  HYPERTENSION blood pressure low normal - on coreg and hydralazine  - continue to hold prinizide due to acute renal failure and low BPs  ARF (acute renal failure) resolving with IVF.  BUN trending down with IVF from 27 to 23.  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  Need one more day of tamiflu before they will accept and tomorrow is a holiday so SNF not accepting new patients.     Consultants:  None Procedures/Studies:  Dg Chest Port 1 View 03/08/2013 No acute disease.  Antibiotics/ANtivirals:  Vancomycin 12/28 --> 12/30  Zosyn 12/28 -->  Tamiflu 12/29 -->    HPI/Subjective:  More awake and alert today.  Denies acute complaints.  Per RN, looks much better today compared to two days ago.    Objective: Filed Vitals:   03/10/13 0700 03/10/13 1319  03/10/13 1329 03/11/13 0518  BP:  108/45 114/45 114/45  Pulse: 64  62 65  Temp: 98.3 F (36.8 C)  98 F (36.7 C) 99.5 F (37.5 C)  TempSrc: Oral  Oral Oral  Resp: 16  18 16   Height:      Weight:      SpO2: 95%  98% 100%    Intake/Output Summary (Last 24 hours) at 03/11/13 1411 Last data filed at 03/11/13 1351  Gross per 24 hour  Intake 2782.5 ml  Output      0 ml  Net 2782.5 ml   Filed Weights   03/09/13 1300  Weight: 50.8 kg (111 lb 15.9 oz)    Exam:   General:  CF, sitting in chair taking her medications, No acute distress  HEENT:  NCAT, MMM  Cardiovascular:  RRR, nl S1, S2, 3/6 systolic murmur, 2+ pulses, warm extremities  Respiratory:  Scattered rhonchi and wheezes, no focal rales, no increased WOB  Abdomen:   NABS, soft, NT/ND  MSK:   Normal tone and bulk, no LEE  Neuro:  Grossly intact  Data Reviewed: Basic Metabolic Panel:  Recent Labs Lab 03/08/13 1435 03/08/13 1912 03/09/13 0550 03/10/13 0557 03/11/13 0525  NA 136  --  136 135* 134*  K 3.7  --  3.8 3.8 3.9  CL 100  --  102 103 100  CO2 24  --  22 21 22   GLUCOSE 95  --  86 93 103*  BUN  34*  --  27* 26* 23  CREATININE 1.36*  --  1.11* 1.05 1.10  CALCIUM 9.2  --  8.0* 8.2* 8.5  MG  --  1.6  --   --   --   PHOS  --  3.4  --   --   --    Liver Function Tests:  Recent Labs Lab 03/08/13 1435 03/09/13 0550  AST 33 46*  ALT 35 33  ALKPHOS 50 36*  BILITOT 0.2* 0.2*  PROT 6.7 5.6*  ALBUMIN 3.3* 2.6*   No results found for this basename: LIPASE, AMYLASE,  in the last 168 hours No results found for this basename: AMMONIA,  in the last 168 hours CBC:  Recent Labs Lab 03/08/13 1435 03/09/13 0550 03/10/13 0557 03/11/13 0525  WBC 9.9 7.5 6.6 5.8  NEUTROABS 7.0  --   --   --   HGB 12.0 10.5* 11.0* 10.5*  HCT 34.8* 30.9* 31.7* 31.1*  MCV 98.6 97.2 95.2 96.0  PLT 211 189 187 174   Cardiac Enzymes: No results found for this basename: CKTOTAL, CKMB, CKMBINDEX, TROPONINI,  in the last  168 hours BNP (last 3 results) No results found for this basename: PROBNP,  in the last 8760 hours CBG:  Recent Labs Lab 03/09/13 0738 03/10/13 0741 03/11/13 0732  GLUCAP 75 108* 89    Recent Results (from the past 240 hour(s))  CULTURE, BLOOD (ROUTINE X 2)     Status: None   Collection Time    03/08/13  2:35 PM      Result Value Range Status   Specimen Description BLOOD RIGHT ARM   Final   Special Requests BOTTLES DRAWN AEROBIC AND ANAEROBIC 5CC   Final   Culture  Setup Time     Final   Value: 03/08/2013 18:51     Performed at [facility]   Culture     Final   Value:        BLOOD CULTURE RECEIVED NO GROWTH TO DATE CULTURE WILL BE HELD FOR 5 DAYS BEFORE ISSUING A FINAL NEGATIVE REPORT     Performed at [facility]   Report Status PENDING   Incomplete  CULTURE, BLOOD (ROUTINE X 2)     Status: None   Collection Time    03/08/13  2:45 PM      Result Value Range Status   Specimen Description BLOOD RIGHT HAND   Final   Special Requests BOTTLES DRAWN AEROBIC AND ANAEROBIC 3CC   Final   Culture  Setup Time     Final   Value: 03/08/2013 18:51     Performed at [facility]   Culture     Final   Value:        BLOOD CULTURE RECEIVED NO GROWTH TO DATE CULTURE WILL BE HELD FOR 5 DAYS BEFORE ISSUING A FINAL NEGATIVE REPORT     Performed at [facility]   Report Status PENDING   Incomplete  URINE CULTURE     Status: None   Collection Time    03/08/13  3:06 PM      Result Value Range Status   Specimen Description URINE, CLEAN CATCH   Final   Special Requests NONE   Final   Culture  Setup Time     Final   Value: 03/08/2013 21:17     Performed at [facility]     Final   Value: >=100,000 COLONIES/ML  Performed at [facility]     Final   Value: ESCHERICHIA COLI     Performed at [facility]   Report Status 03/11/2013 FINAL   Final   Organism ID, Bacteria ESCHERICHIA COLI   Final  MRSA PCR  SCREENING     Status: None   Collection Time    03/09/13  2:38 PM      Result Value Range Status   MRSA by PCR NEGATIVE  NEGATIVE Final   Comment:            The GeneXpert MRSA Assay (FDA     approved for NASAL specimens     only), is one component of a     comprehensive MRSA colonization     surveillance program. It is not     intended to diagnose MRSA     infection nor to guide or     monitor treatment for     MRSA infections.  CLOSTRIDIUM DIFFICILE BY PCR     Status: None   Collection Time    03/10/13  2:07 PM      Result Value Range Status   C difficile by pcr NEGATIVE  NEGATIVE Final   Comment: Performed at [facility]     Studies: No results found.  Scheduled Meds: . aspirin EC  81 mg Oral Daily  . calcium-vitamin D  1 tablet Oral BID  . carvedilol  12.5 mg Oral BID WC  . cholecalciferol  2,000 Units Oral Daily  . feeding supplement (ENSURE COMPLETE)  237 mL Oral BID BM  . haloperidol  1 mg Oral QHS  . hydrALAZINE  25 mg Oral Q8H  . multivitamin with minerals  1 tablet Oral Daily  . oseltamivir  75 mg Oral Daily  . potassium chloride  20 mEq Oral Daily  . sulfamethoxazole-trimethoprim  1 tablet Oral Q12H   Continuous Infusions: . sodium chloride 50 mL (03/11/13 1027)    Principal Problem:   UTI (lower urinary tract infection) Active Problems:   DEPRESSION/ANXIETY   HYPERTENSION   ARF (acute renal failure)   Dementia    Time spent: 30 min    Sina Lucchesi, [facility] Pager [phone]. If 7PM-7AM, please contact night-coverage at [email or web address], password [facility] 03/11/2013, 2:11 PM  LOS: 3 days

## 2013-03-11 NOTE — Progress Notes (Addendum)
INITIAL NUTRITION ASSESSMENT  DOCUMENTATION CODES Per approved criteria  -Not Applicable   INTERVENTION: Continue Ensure BID each supplement provides 350 kcal and 13 grams of protein Provide Magic Cup once daily, provides 290 kcal and 9 grams protein Continue Multivitamin with mineral daily  NUTRITION DIAGNOSIS: Unintentional weight loss related to malnutrition/FTT as evidenced by 3% weight loss in the past 7 weeks.   Goal: Pt to meet >/= 90% of their estimated nutrition needs  Monitor:  PO intake Weight trends Labs  Reason for Assessment: Consult to Assess  79 y.o. female  Admitting Dx: UTI (lower urinary tract infection)  ASSESSMENT: 79 year old female with past medical history of hypertension, osteoporosis and related chronic pain, depression who presented to [facility] ED 03/08/2013 with worsening weakness and poor oral intake.  Per MD note, pt has moderate malnutrition secondary to acute illness imposed on chronic failure to thrive. Pt states that her appetite is good. RD assessed pt 12/29 and per pt's daughter pt was eating well ate home with 3 meals daily. Per nursing notes pt is eating 75-100% of meals but, unsure how many meals daily consumed. Pt has lost an additional 1 lb in the past week with a total of 3.5% wt loss in the past 7 weeks. Pt states she likes ice cream and milk shakes. Will continue Ensure Complete once daily and add Magic cup ice cream once daily.   Height: Ht Readings from Last 1 Encounters:  03/09/13 5' (1.524 m)    Weight: Wt Readings from Last 1 Encounters:  03/09/13 111 lb 15.9 oz (50.8 kg)    Ideal Body Weight: 100 lbs  % Ideal Body Weight: 111%  Wt Readings from Last 10 Encounters:  03/09/13 111 lb 15.9 oz (50.8 kg)  03/02/13 112 lb (50.803 kg)  01/22/13 115 lb (52.164 kg)  08/20/12 121 lb (54.885 kg)  08/19/12 120 lb (54.432 kg)  02/04/12 125 lb (56.7 kg)  02/04/12 125 lb (56.7 kg)  01/11/12 122 lb (55.339 kg)  12/11/11 120 lb (54.432  kg)  10/22/11 117 lb (53.071 kg)    Usual Body Weight: 120 lbs  % Usual Body Weight: 93%  BMI:  Body mass index is 21.87 kg/(m^2).  Estimated Nutritional Needs: Kcal: [id number] Protein: 55-65 grams Fluid: 1.5 L/day  Skin: intact  Diet Order: Dysphagia  EDUCATION NEEDS: -No education needs identified at this time   Intake/Output Summary (Last 24 hours) at 03/11/13 0916 Last data filed at 03/11/13 0906  Gross per 24 hour  Intake 2422.5 ml  Output      0 ml  Net 2422.5 ml    Last BM: 12/30   Labs:   Recent Labs Lab 03/08/13 1435 03/08/13 1912 03/09/13 0550 03/10/13 0557 03/11/13 0525  NA 136  --  136 135* 134*  K 3.7  --  3.8 3.8 3.9  CL 100  --  102 103 100  CO2 24  --  22 21 22   BUN 34*  --  27* 26* 23  CREATININE 1.36*  --  1.11* 1.05 1.10  CALCIUM 9.2  --  8.0* 8.2* 8.5  MG  --  1.6  --   --   --   PHOS  --  3.4  --   --   --   GLUCOSE 95  --  86 93 103*    CBG (last 3)   Recent Labs  03/09/13 0738 03/10/13 0741 03/11/13 0732  GLUCAP 75 108* 89    Scheduled Meds: .  aspirin EC  81 mg Oral Daily  . calcium-vitamin D  1 tablet Oral BID  . carvedilol  12.5 mg Oral BID WC  . cholecalciferol  2,000 Units Oral Daily  . feeding supplement (ENSURE COMPLETE)  237 mL Oral BID BM  . haloperidol  1 mg Oral QHS  . hydrALAZINE  25 mg Oral Q8H  . multivitamin with minerals  1 tablet Oral Daily  . oseltamivir  75 mg Oral Daily  . potassium chloride  20 mEq Oral Daily  . sulfamethoxazole-trimethoprim  1 tablet Oral Q12H    Continuous Infusions: . sodium chloride 75 mL/hr at 03/11/13 [id number]    Past Medical History  Diagnosis Date  . Arthritis   . Hyperlipidemia   . Hypertension   . Osteoporosis   . Sick sinus syndrome     Past Surgical History  Procedure Laterality Date  . Appendectomy    . Vesicovaginal fistula closure w/ tah    . Tonsillectomy    . Abdominal hysterectomy    . Pacemaker generator change  02/04/2012    Pryor Ochoa  RD, LDN Inpatient Clinical Dietitian Pager: [phone] After Hours Pager: [phone]

## 2013-03-11 NOTE — Progress Notes (Addendum)
CSW called daughter, Gasper Lloyd, gave bed offers, [facility]. [facility]. [facility] would be able to accept patient Friday morning.  Tahjai Schetter C. Pass Christian MSW, [place]

## 2013-03-12 DIAGNOSIS — N179 Acute kidney failure, unspecified: Secondary | ICD-10-CM | POA: Diagnosis not present

## 2013-03-12 DIAGNOSIS — E871 Hypo-osmolality and hyponatremia: Secondary | ICD-10-CM | POA: Diagnosis not present

## 2013-03-12 DIAGNOSIS — J111 Influenza due to unidentified influenza virus with other respiratory manifestations: Secondary | ICD-10-CM

## 2013-03-12 DIAGNOSIS — N12 Tubulo-interstitial nephritis, not specified as acute or chronic: Secondary | ICD-10-CM | POA: Diagnosis not present

## 2013-03-12 DIAGNOSIS — N10 Acute tubulo-interstitial nephritis: Secondary | ICD-10-CM

## 2013-03-12 DIAGNOSIS — A419 Sepsis, unspecified organism: Principal | ICD-10-CM

## 2013-03-12 DIAGNOSIS — F0390 Unspecified dementia without behavioral disturbance: Secondary | ICD-10-CM | POA: Diagnosis not present

## 2013-03-12 DIAGNOSIS — A4151 Sepsis due to Escherichia coli [E. coli]: Secondary | ICD-10-CM | POA: Insufficient documentation

## 2013-03-12 LAB — GLUCOSE, CAPILLARY: Glucose-Capillary: 86 mg/dL (ref 70–99)

## 2013-03-12 MED ORDER — OSELTAMIVIR PHOSPHATE 75 MG PO CAPS: 75.0000 mg | ORAL_CAPSULE | Freq: Every day | ORAL | Status: DC

## 2013-03-12 MED ORDER — ENSURE COMPLETE PO LIQD: 237.0000 mL | Freq: Two times a day (BID) | ORAL | Status: DC

## 2013-03-12 MED ORDER — OXYCODONE HCL 5 MG PO TABS: 5.0000 mg | ORAL_TABLET | Freq: Four times a day (QID) | ORAL | Status: DC | PRN

## 2013-03-12 NOTE — Discharge Summary (Addendum)
Physician Discharge Summary  [name] [id number]:[id number] DOB: 27-Sep-1928 DOA: 03/08/2013  PCP: Eulas Post, MD  Admit date: 03/08/2013 Discharge date: 03/12/2013  Recommendations for Outpatient Follow-up:  1. To SNF for ongoing PT/OT, rehabilitation 2. Continue to check blood pressure and if SBP > 140, consider restarting some BP medication 3. Check BMP in 1 week to ensure potassium and creatinine are stable, and CBC to follow up anemia.  If anemia persistent, please consider further evaluation.    Discharge Diagnoses:  Principal Problem:   Influenza without pneumonia Active Problems:   DEPRESSION/ANXIETY   HYPERTENSION   ARF (acute renal failure)   Dementia   Normocytic anemia   Hyponatremia   Acute pyelonephritis   Sepsis   Discharge Condition: stable, improved  Diet recommendation: dysphagia 3  Wt Readings from Last 3 Encounters:  03/09/13 50.8 kg (111 lb 15.9 oz)  03/02/13 50.803 kg (112 lb)  01/22/13 52.164 kg (115 lb)    History of present illness:  80 year old female with past medical history of hypertension, osteoporosis and related chronic pain, depression who presented to [facility] ED 03/08/2013 with worsening weakness and poor oral intake. No associated nausea or vomiting. No chest pain, palpitations, shortness of breath. No reports of blood in urine or stool. No lightheadedness or loss of consciousness.   In ED, BP was 131/45, RR was 22, HR was 64, T max was 103 F. Blood work revealed acute renal failure with creatinine of 1.36. Pt was found to have UTI on urinalysis report. CXR did not reveal acute cardiopulmonary process.  Hospital Course:   Influenza, influenza A+. She was started on Tamiflu and has received 4 days of Tamiflu. She will complete her course of Tamiflu on January 2.  She is stable on room air and chest x-[place] demonstrated no acute disease.  Her fevers were initially as high as 103 Fahrenheit and today down to 99.4. Her white blood cell count was  mildly elevated but still within normal limits, but it also trended down with initiation of antibiotics and Tamiflu.  Escherichia coli pyelonephritis and sepsis. Because of her severe fever and illness, she was initially placed on IV Zosyn. After sensitivities to recall I returned, she was transitioned to Bactrim and she received a total of 5 days of antibiotics on hospital.  Her blood cultures are so far no growth to date.  Primary physician to follow up on final report.  Fever, with likely secondary to urinary tract infection influenza. Her chest x-[place] did not demonstrate pneumonia. Her temperature trended down with initiation of antibiotics and Tamiflu.  Hypertension, blood pressure was low normal. She continued Coreg. Her Prinzide and hydralazine were held because of acute renal failure and low blood pressures.    Acute renal failure which is mild. Her BUN trended down with IV fluids 0.7 and 23, and her creatinine trended down from 1.36-1.1. She may have been mildly dehydrated because of her urinary tract infection.  Dementia, initially slightly sleepy and confused, but returned to her baseline mentation within a few days.  Normocytic anemia, likely due to chronic disease, no obvious bleeding. Defer further workup to her primary care doctor if this persists.  Hyponatremia, likely secondary to mild dehydration, alternatively she may have reset osmostat secondary to her poor oral intake and dementia.  Her sodium remained stable around 134-135.  Moderate protein calorie malnutrition, likely secondary to dementia and failure to thrive. She was seen by nutrition who recommended some additional supplements and a liberalized diet.  Consultants:  None Procedures/Studies:  Dg Chest Port 1 View 03/08/2013 No acute disease.  Antibiotics/ANtivirals:  Vancomycin 12/28 --> 12/30  Zosyn 12/28 --> 12/31 Tamiflu 12/29 -->  Bactrim 12/31 >>  Discharge Exam: Filed Vitals:   03/12/13 0500  BP:  114/59  Pulse: 84  Temp: 98.8 F (37.1 C)  Resp:    Filed Vitals:   03/11/13 1623 03/11/13 2127 03/11/13 2145 03/12/13 0500  BP: 113/68 141/60  114/59  Pulse: 64 64  84  Temp: 98.4 F (36.9 C) 99.4 F (37.4 C) 99.4 F (37.4 C) 98.8 F (37.1 C)  TempSrc: Oral Oral Rectal Rectal  Resp: 15 14    Height:      Weight:      SpO2: 95% 96%  92%    General: CF, sitting in chair taking her medications, No acute distress  HEENT: NCAT, MMM  Cardiovascular: RRR, nl S1, S2, 3/6 systolic murmur, 2+ pulses, warm extremities  Respiratory: Scattered rhonchi and wheezes, no focal rales, no increased WOB  Abdomen: NABS, soft, NT/ND  MSK: Normal tone and bulk, no LEE  Neuro: Grossly intact   Discharge Instructions      Discharge Orders   Future Appointments Provider Department Dept Phone   04/27/2013 8:20 AM [facility] [place] [facility] [phone]   Future Orders Complete By Expires   Call MD for:  difficulty breathing, headache or visual disturbances  As directed    Call MD for:  extreme fatigue  As directed    Call MD for:  hives  As directed    Call MD for:  persistant dizziness or light-headedness  As directed    Call MD for:  persistant nausea and vomiting  As directed    Call MD for:  severe uncontrolled pain  As directed    Call MD for:  temperature >100.4  As directed    Diet general  As directed    Discharge instructions  As directed    Comments:     Ms. Andries was hospitalized with fever and was found to have  Urinary tract infection and flu.  She has completed treatment for her urinary tract infection and should continue her tamiflu for one more day, then stop.  Please encourage oral intake and OOB as much as tolerated.  Continue PT/OT.   Increase activity slowly  As directed        Medication List    STOP taking these medications       hydrALAZINE 25 MG tablet  Commonly known as:  APRESOLINE     lisinopril-hydrochlorothiazide 20-12.5 MG  per tablet  Commonly known as:  PRINZIDE,ZESTORETIC     oxyCODONE-acetaminophen 10-325 MG per tablet  Commonly known as:  PERCOCET     potassium chloride 10 MEQ tablet  Commonly known as:  K-DUR      TAKE these medications       aspirin 81 MG tablet  Take 81 mg by mouth daily.     calcium-vitamin D 500-200 MG-UNIT per tablet  Commonly known as:  OSCAL WITH D  Take 1 tablet by mouth 2 (two) times daily.     carvedilol 12.5 MG tablet  Commonly known as:  COREG  Take 12.5 mg by mouth 2 (two) times daily with a meal.     feeding supplement (ENSURE COMPLETE) Liqd  Take 237 mLs by mouth 2 (two) times daily between meals.     haloperidol 1 MG tablet  Commonly known as:  HALDOL  take 1 tablet by mouth at bedtime     multivitamin with minerals Tabs tablet  Take 1 tablet by mouth daily. Centrum silver     oseltamivir 75 MG capsule  Commonly known as:  TAMIFLU  Take 1 capsule (75 mg total) by mouth daily.     oxyCODONE 5 MG immediate release tablet  Commonly known as:  Oxy IR/ROXICODONE  Take 1 tablet (5 mg total) by mouth every 6 (six) hours as needed for severe pain.     temazepam 15 MG capsule  Commonly known as:  RESTORIL  take 1 capsule by mouth at bedtime if needed     Vitamin D 2000 UNITS Caps  Take 2,000 Units by mouth daily.       Follow-up Information   Follow up with Eulas Post, MD. Schedule an appointment as soon as possible for a visit in 2 weeks.   Specialty:  Family Medicine   Contact information:   [place] [address] [phone]       The results of significant diagnostics from this hospitalization (including imaging, microbiology, ancillary and laboratory) are listed below for reference.    Significant Diagnostic Studies: Dg Chest Port 1 View  03/08/2013   CLINICAL DATA:  Weakness.  Confusion.  EXAM: PORTABLE CHEST - 1 VIEW  COMPARISON:  Plain film the chest 04/01/2011 and CT chest 04/01/2011.  FINDINGS: The  lungs are clear. Heart size is upper normal. No pneumothorax or pleural fluid. Pacing device noted.  IMPRESSION: No acute disease.   Electronically Signed   By: Inge Rise M.D.   On: 03/08/2013 14:27    Microbiology: Recent Results (from the past 240 hour(s))  CULTURE, BLOOD (ROUTINE X 2)     Status: None   Collection Time    03/08/13  2:35 PM      Result Value Range Status   Specimen Description BLOOD RIGHT ARM   Final   Special Requests BOTTLES DRAWN AEROBIC AND ANAEROBIC 5CC   Final   Culture  Setup Time     Final   Value: 03/08/2013 18:51     Performed at [facility]   Culture     Final   Value:        BLOOD CULTURE RECEIVED NO GROWTH TO DATE CULTURE WILL BE HELD FOR 5 DAYS BEFORE ISSUING A FINAL NEGATIVE REPORT     Performed at [facility]   Report Status PENDING   Incomplete  CULTURE, BLOOD (ROUTINE X 2)     Status: None   Collection Time    03/08/13  2:45 PM      Result Value Range Status   Specimen Description BLOOD RIGHT HAND   Final   Special Requests BOTTLES DRAWN AEROBIC AND ANAEROBIC 3CC   Final   Culture  Setup Time     Final   Value: 03/08/2013 18:51     Performed at [facility]   Culture     Final   Value:        BLOOD CULTURE RECEIVED NO GROWTH TO DATE CULTURE WILL BE HELD FOR 5 DAYS BEFORE ISSUING A FINAL NEGATIVE REPORT     Performed at [facility]   Report Status PENDING   Incomplete  URINE CULTURE     Status: None   Collection Time    03/08/13  3:06 PM      Result Value Range Status   Specimen Description URINE, CLEAN CATCH   Final  Special Requests NONE   Final   Culture  Setup Time     Final   Value: 03/08/2013 21:17     Performed at [facility]     Final   Value: >=100,000 COLONIES/ML     Performed at [facility]   Culture     Final   Value: ESCHERICHIA COLI     Performed at [facility]   Report Status 03/11/2013 FINAL   Final   Organism ID, Bacteria ESCHERICHIA  COLI   Final  MRSA PCR SCREENING     Status: None   Collection Time    03/09/13  2:38 PM      Result Value Range Status   MRSA by PCR NEGATIVE  NEGATIVE Final   Comment:            The GeneXpert MRSA Assay (FDA     approved for NASAL specimens     only), is one component of a     comprehensive MRSA colonization     surveillance program. It is not     intended to diagnose MRSA     infection nor to guide or     monitor treatment for     MRSA infections.  CLOSTRIDIUM DIFFICILE BY PCR     Status: None   Collection Time    03/10/13  2:07 PM      Result Value Range Status   C difficile by pcr NEGATIVE  NEGATIVE Final   Comment: Performed at [facility]: Basic Metabolic Panel:  Recent Labs Lab 03/08/13 1435 03/08/13 1912 03/09/13 0550 03/10/13 0557 03/11/13 0525  NA 136  --  136 135* 134*  K 3.7  --  3.8 3.8 3.9  CL 100  --  102 103 100  CO2 24  --  22 21 22   GLUCOSE 95  --  86 93 103*  BUN 34*  --  27* 26* 23  CREATININE 1.36*  --  1.11* 1.05 1.10  CALCIUM 9.2  --  8.0* 8.2* 8.5  MG  --  1.6  --   --   --   PHOS  --  3.4  --   --   --    Liver Function Tests:  Recent Labs Lab 03/08/13 1435 03/09/13 0550  AST 33 46*  ALT 35 33  ALKPHOS 50 36*  BILITOT 0.2* 0.2*  PROT 6.7 5.6*  ALBUMIN 3.3* 2.6*   No results found for this basename: LIPASE, AMYLASE,  in the last 168 hours No results found for this basename: AMMONIA,  in the last 168 hours CBC:  Recent Labs Lab 03/08/13 1435 03/09/13 0550 03/10/13 0557 03/11/13 0525  WBC 9.9 7.5 6.6 5.8  NEUTROABS 7.0  --   --   --   HGB 12.0 10.5* 11.0* 10.5*  HCT 34.8* 30.9* 31.7* 31.1*  MCV 98.6 97.2 95.2 96.0  PLT 211 189 187 174   Cardiac Enzymes: No results found for this basename: CKTOTAL, CKMB, CKMBINDEX, TROPONINI,  in the last 168 hours BNP: BNP (last 3 results) No results found for this basename: PROBNP,  in the last 8760 hours CBG:  Recent Labs Lab 03/09/13 0738 03/10/13 0741  03/11/13 0732 03/12/13 0731  GLUCAP 75 108* 89 86    Time coordinating discharge: 45 minutes  Signed:  Zarie Kosiba  [facility] 03/12/2013, 11:32 AM

## 2013-03-12 NOTE — Progress Notes (Signed)
Physical Therapy Treatment Patient Details Name: Heather Jarvis MRN: [id number] DOB: 1928-11-10 Today's Date: 03/12/2013 Time: [id number] PT Time Calculation (min): 19 min  PT Assessment / Plan / Recommendation  History of Present Illness 80 year old female with past medical history of hypertension, osteoporosis and related chronic pain, depression who presented to [facility] ED 03/08/2013 with worsening weakness and poor oral intake. No associated nausea or vomiting. No chest pain, palpitations, shortness of breath. No reports of blood in urine or stool. No lightheadedness or loss of consciousness.   PT Comments   Pt and bed soaked in urine.  Pt resistant to mobility at times despite firm cues about need for hygiene and changing wet bed linens.  Pt with no verbalizations throughout session.  RN made aware of incontinence.    Follow Up Recommendations  SNF     Does the patient have the potential to tolerate intense rehabilitation     Barriers to Discharge        Equipment Recommendations  None recommended by PT    Recommendations for Other Services    Frequency Min 2X/week   Progress towards PT Goals Progress towards PT goals: Progressing toward goals  Plan Frequency needs to be updated    Precautions / Restrictions Precautions Precautions: Fall Precaution Comments: pt incontinent Restrictions Weight Bearing Restrictions: No   Pertinent Vitals/Pain Did not indicate pain.      Mobility  Bed Mobility Bed Mobility: Sitting - Scoot to Edge of Bed;Sit to Supine;Rolling Left;Left Sidelying to Sit Rolling Left: 3: Mod assist Left Sidelying to Sit: 1: +2 Total assist;HOB flat Left Sidelying to Sit: Patient Percentage: 20% Sitting - Scoot to Edge of Bed: 1: +2 Total assist Sitting - Scoot to Edge of Bed: Patient Percentage: 0% Sit to Supine: 1: +2 Total assist Sit to Supine: Patient Percentage: 20% Details for Bed Mobility Assistance: pt soaked in urine.  Rolled for hygiene with pt attempting  to A with mobility. pt did A with coming up to sit, but then very quickly began pushing back down to sitting.  Firm cueing and A to maintain sitting EOB while second person changed bed linens.   Transfers Transfers: Not assessed Ambulation/Gait Ambulation/Gait Assistance: Not tested (comment) Stairs: No Wheelchair Mobility Wheelchair Mobility: No    Exercises     PT Diagnosis:    PT Problem List:   PT Treatment Interventions:     PT Goals (current goals can now be found in the care plan section) Acute Rehab PT Goals Time For Goal Achievement: 03/16/13 Potential to Achieve Goals: Fair  Visit Information  Last PT Received On: 03/12/13 Assistance Needed: +2 History of Present Illness: 80 year old female with past medical history of hypertension, osteoporosis and related chronic pain, depression who presented to [facility] ED 03/08/2013 with worsening weakness and poor oral intake. No associated nausea or vomiting. No chest pain, palpitations, shortness of breath. No reports of blood in urine or stool. No lightheadedness or loss of consciousness.    Subjective Data      Cognition  Cognition Arousal/Alertness:  (Drowsy, but arousable) Behavior During Therapy: Flat affect Overall Cognitive Status: No family/caregiver present to determine baseline cognitive functioning General Comments: pt with no verbalizations during session and follows minimal one step directions.      Balance  Balance Balance Assessed: No  End of Session PT - End of Session Activity Tolerance:  (Limited by cognition.  ) Patient left: in bed;with call bell/phone within reach;with bed alarm set Nurse Communication:  Mobility status   GP     Heather Jarvis, Heather Jarvis 03/12/2013, 9:50 AM

## 2013-03-12 NOTE — Progress Notes (Addendum)
On call csw received call from RN CM regarding patient having discharge order once snf bed available. Per chart review patient chose bed at [facility] and patient can be accepted on Friday. CSW spoke with [facility], Claudean Kinds who stated that new admissions would be accepted on Friday, however not today due to holiday and no new admissions per Director of nursing. CSW informed Rn CM. Unit csw to follow up tomorrow to assist with patient disposition needs and discharge.   Dorathy Kinsman, LCSW on call csw pager # [phone] .03/12/2013 1253pm

## 2013-03-12 NOTE — Progress Notes (Signed)
Pt doesn't respond when spoken to. She can follow directions when instructed to do something. Pt doesn't want to do anything today but lay in bed and sleep.

## 2013-03-13 DIAGNOSIS — F0390 Unspecified dementia without behavioral disturbance: Secondary | ICD-10-CM | POA: Diagnosis not present

## 2013-03-13 DIAGNOSIS — Z5189 Encounter for other specified aftercare: Secondary | ICD-10-CM | POA: Diagnosis not present

## 2013-03-13 DIAGNOSIS — R29818 Other symptoms and signs involving the nervous system: Secondary | ICD-10-CM | POA: Diagnosis not present

## 2013-03-13 DIAGNOSIS — M1990 Unspecified osteoarthritis, unspecified site: Secondary | ICD-10-CM | POA: Diagnosis not present

## 2013-03-13 DIAGNOSIS — F329 Major depressive disorder, single episode, unspecified: Secondary | ICD-10-CM | POA: Diagnosis not present

## 2013-03-13 DIAGNOSIS — R262 Difficulty in walking, not elsewhere classified: Secondary | ICD-10-CM | POA: Diagnosis not present

## 2013-03-13 DIAGNOSIS — N390 Urinary tract infection, site not specified: Secondary | ICD-10-CM | POA: Diagnosis not present

## 2013-03-13 DIAGNOSIS — F411 Generalized anxiety disorder: Secondary | ICD-10-CM | POA: Diagnosis not present

## 2013-03-13 DIAGNOSIS — J111 Influenza due to unidentified influenza virus with other respiratory manifestations: Secondary | ICD-10-CM | POA: Diagnosis not present

## 2013-03-13 DIAGNOSIS — R279 Unspecified lack of coordination: Secondary | ICD-10-CM | POA: Diagnosis not present

## 2013-03-13 DIAGNOSIS — N179 Acute kidney failure, unspecified: Secondary | ICD-10-CM | POA: Diagnosis not present

## 2013-03-13 DIAGNOSIS — F3289 Other specified depressive episodes: Secondary | ICD-10-CM | POA: Diagnosis not present

## 2013-03-13 DIAGNOSIS — J189 Pneumonia, unspecified organism: Secondary | ICD-10-CM | POA: Diagnosis not present

## 2013-03-13 DIAGNOSIS — I10 Essential (primary) hypertension: Secondary | ICD-10-CM | POA: Diagnosis not present

## 2013-03-13 LAB — GLUCOSE, CAPILLARY: Glucose-Capillary: 86 mg/dL (ref 70–99)

## 2013-03-13 MED ORDER — TEMAZEPAM 15 MG PO CAPS: ORAL_CAPSULE | ORAL | Status: DC

## 2013-03-13 NOTE — Progress Notes (Addendum)
Pt discharged to [facility]. IV dc'd. Ambulance here to take pt. Report called to [facility]. Information packet sent with pt.

## 2013-03-13 NOTE — Progress Notes (Signed)
Clinical Social Work Department CLINICAL SOCIAL WORK PLACEMENT NOTE 03/13/2013  Patient:  Heather Jarvis, Heather Jarvis  Account Number:  [id number] Admit date:  03/08/2013  Clinical Social Worker:  Caren Hazy, LCSW  Date/time:  03/09/2013 12:00 M  Clinical Social Work is seeking post-discharge placement for this patient at the following level of care:   [place]   (*CSW will update this form in Epic as items are completed)   03/09/2013  Patient/family provided with [place] Department of Clinical Social Work's list of facilities offering this level of care within the geographic area requested by the patient (or if unable, by the patient's family).  03/09/2013  Patient/family informed of their freedom to choose among providers that offer the needed level of care, that participate in Medicare, Medicaid or managed care program needed by the patient, have an available bed and are willing to accept the patient.  03/09/2013  Patient/family informed of [facility]' ownership interest in [facility], as well as of the fact that they are under no obligation to receive care at this facility.  PASARR submitted to EDS on 03/09/2013 PASARR number received from EDS on 03/09/2013  FL2 transmitted to all facilities in geographic area requested by pt/family on  03/09/2013 FL2 transmitted to all facilities within larger geographic area on 03/09/2013  Patient informed that his/her managed care company has contracts with or will negotiate with  certain facilities, including the following:     Patient/family informed of bed offers received:  03/13/2013 Patient chooses bed at [place], [facility] Physician recommends and patient chooses bed at    Patient to be transferred to [facility] on  03/13/2013 Patient to be transferred to facility by ptar  The following physician request were entered in Epic:   Additional Comments:

## 2013-03-13 NOTE — Progress Notes (Signed)
Patient cleared for discharge. Packet copied and placed in [place]. [facility] ready to accept. CSW called patient's daughter, Carnella [place], She is aware and agreeable to discharge. ptar called for transportation. Family understands no guarantee of payment.  Shareen Capwell C. [place] MSW, [place]

## 2013-03-13 NOTE — Discharge Summary (Addendum)
Physician Discharge Summary  Heather Jarvis [id number] DOB: November 04, 1928 DOA: 03/08/2013  PCP: Eulas Post, MD  Admit date: 03/08/2013 Discharge date: 03/28/2013  Recommendations for Outpatient Follow-up:  1. To SNF for ongoing PT/OT, rehabilitation 2. Continue to check blood pressure and if SBP > 140, consider restarting some BP medication 3. Check BMP in 1 week to ensure potassium and creatinine are stable, and CBC to follow up anemia.  If anemia persistent, please consider further evaluation.    Follow up on blood cultures which are pending.    Discharge Diagnoses:  Principal Problem:   Influenza without pneumonia Active Problems:   DEPRESSION/ANXIETY   HYPERTENSION   ARF (acute renal failure)   Dementia   Normocytic anemia   Hyponatremia   Acute pyelonephritis   Sepsis due to Escherichia coli pyelonephritis   Discharge Condition: stable, improved  Diet recommendation: dysphagia 3  Wt Readings from Last 3 Encounters:  03/09/13 50.8 kg (111 lb 15.9 oz)  03/02/13 50.803 kg (112 lb)  01/22/13 52.164 kg (115 lb)    History of present illness:  80 year old female with past medical history of hypertension, osteoporosis and related chronic pain, depression who presented to [facility] ED 03/08/2013 with worsening weakness and poor oral intake. No associated nausea or vomiting. No chest pain, palpitations, shortness of breath. No reports of blood in urine or stool. No lightheadedness or loss of consciousness.   In ED, BP was 131/45, RR was 22, HR was 64, T max was 103 F. Blood work revealed acute renal failure with creatinine of 1.36. Pt was found to have UTI on urinalysis report. CXR did not reveal acute cardiopulmonary process.  Hospital Course:   Influenza, influenza A+. She was started on Tamiflu and has received 5 days of Tamiflu.  She is stable on room air and chest x-Standen demonstrated no acute disease.  Her fevers were initially as high as 103 Fahrenheit and today down to 97.4.  Her white blood cell count was mildly elevated but still within normal limits, but it also trended down with initiation of antibiotics and Tamiflu.  Escherichia coli pyelonephritis and sepsis which was present on admission. Because of her severe fever and illness, she was initially placed on IV Zosyn. After sensitivities returned, she was transitioned to Bactrim and she received a total of 5 days of antibiotics on hospital.  Her blood cultures are so far no growth to date.  Primary physician to follow up on final report.  Fever, with likely secondary to urinary tract infection influenza. Her chest x-[place] did not demonstrate pneumonia. Her temperature trended down with initiation of antibiotics and Tamiflu.  Hypertension, blood pressure was low normal. She continued Coreg. Her Prinzide and hydralazine were held because of acute renal failure and low blood pressures.    Acute renal failure which is mild. Her BUN trended down with IV fluids 0.7 and 23, and her creatinine trended down from 1.36-1.1. She may have been mildly dehydrated because of her urinary tract infection.  Dementia, initially slightly sleepy and confused, but returned to her baseline mentation within a few days.  Normocytic anemia, likely due to chronic disease, no obvious bleeding. Defer further workup to her primary care doctor if this persists.  Hyponatremia, likely secondary to mild dehydration, alternatively she may have reset osmostat secondary to her poor oral intake and dementia.  Her sodium remained stable around 134-135.  Moderate protein calorie malnutrition, likely secondary to dementia and failure to thrive. She was seen by nutrition who recommended  some additional supplements and a liberalized diet.   Consultants:  None Procedures/Studies:  Dg Chest Port 1 View 03/08/2013 No acute disease.  Antibiotics/ANtivirals:  Vancomycin 12/28 --> 12/30  Zosyn 12/28 --> 12/31 Tamiflu 12/29 --> 1/2 Bactrim 12/31 >>  1/2  Discharge Exam: Filed Vitals:   03/13/13 0842  BP: 139/67  Pulse: 64  Temp:   Resp:    Filed Vitals:   03/12/13 2143 03/13/13 0200 03/13/13 0620 03/13/13 0842  BP: 146/62 119/75 139/67 139/67  Pulse: 64 67 64 64  Temp: 98.2 F (36.8 C) 97.7 F (36.5 C) 97.4 F (36.3 C)   TempSrc: Oral Axillary Axillary   Resp:      Height:      Weight:      SpO2: 96% 100%      General: CF, eating breakfast, No acute distress  HEENT: NCAT, MMM  Cardiovascular: RRR, nl S1, S2, 3/6 systolic murmur, 2+ pulses, warm extremities  Respiratory: Scattered rhonchi and wheezes, but improved, no focal rales, no increased WOB  Abdomen: NABS, soft, NT/ND  MSK: Normal tone and bulk, no LEE  Neuro: Grossly intact   Discharge Instructions      Discharge Orders   Future Appointments Provider Department Dept Phone   04/27/2013 8:20 AM [facility] [place] Office [phone]   Future Orders Complete By Expires   Call MD for:  difficulty breathing, headache or visual disturbances  As directed    Call MD for:  extreme fatigue  As directed    Call MD for:  hives  As directed    Call MD for:  persistant dizziness or light-headedness  As directed    Call MD for:  persistant nausea and vomiting  As directed    Call MD for:  severe uncontrolled pain  As directed    Call MD for:  temperature >100.4  As directed    Diet general  As directed    Discharge instructions  As directed    Comments:     Heather Jarvis was hospitalized with fever and was found to have  Urinary tract infection and flu.  She has completed treatment for her urinary tract infection and her tamiflu for flu.  Please encourage oral intake and OOB as much as tolerated.  Continue PT/OT.   Increase activity slowly  As directed        Medication List    STOP taking these medications       hydrALAZINE 25 MG tablet  Commonly known as:  APRESOLINE     lisinopril-hydrochlorothiazide 20-12.5 MG per tablet   Commonly known as:  PRINZIDE,ZESTORETIC     oxyCODONE-acetaminophen 10-325 MG per tablet  Commonly known as:  PERCOCET     potassium chloride 10 MEQ tablet  Commonly known as:  K-DUR      TAKE these medications       aspirin 81 MG tablet  Take 81 mg by mouth daily.     calcium-vitamin D 500-200 MG-UNIT per tablet  Commonly known as:  OSCAL WITH D  Take 1 tablet by mouth 2 (two) times daily.     carvedilol 12.5 MG tablet  Commonly known as:  COREG  Take 12.5 mg by mouth 2 (two) times daily with a meal.     feeding supplement (ENSURE COMPLETE) Liqd  Take 237 mLs by mouth 2 (two) times daily between meals.     haloperidol 1 MG tablet  Commonly known as:  HALDOL  take 1 tablet  by mouth at bedtime     multivitamin with minerals Tabs tablet  Take 1 tablet by mouth daily. Centrum silver     oxyCODONE 5 MG immediate release tablet  Commonly known as:  Oxy IR/ROXICODONE  Take 1 tablet (5 mg total) by mouth every 6 (six) hours as needed for severe pain.     temazepam 15 MG capsule  Commonly known as:  RESTORIL  take 1 capsule by mouth at bedtime if needed     Vitamin D 2000 UNITS Caps  Take 2,000 Units by mouth daily.       Follow-up Information   Follow up with Eulas Post, MD. Schedule an appointment as soon as possible for a visit in 2 weeks.   Specialty:  Family Medicine   Contact information:   [place] [address] [phone]       The results of significant diagnostics from this hospitalization (including imaging, microbiology, ancillary and laboratory) are listed below for reference.    Significant Diagnostic Studies: Dg Chest Port 1 View  03/08/2013   CLINICAL DATA:  Weakness.  Confusion.  EXAM: PORTABLE CHEST - 1 VIEW  COMPARISON:  Plain film the chest 04/01/2011 and CT chest 04/01/2011.  FINDINGS: The lungs are clear. Heart size is upper normal. No pneumothorax or pleural fluid. Pacing device noted.  IMPRESSION: No acute  disease.   Electronically Signed   By: Inge Rise M.D.   On: 03/08/2013 14:27    Microbiology: No results found for this or any previous visit (from the past 240 hour(s)).   Labs: Basic Metabolic Panel: No results found for this basename: NA, K, CL, CO2, GLUCOSE, BUN, CREATININE, CALCIUM, MG, PHOS,  in the last 168 hours Liver Function Tests: No results found for this basename: AST, ALT, ALKPHOS, BILITOT, PROT, ALBUMIN,  in the last 168 hours No results found for this basename: LIPASE, AMYLASE,  in the last 168 hours No results found for this basename: AMMONIA,  in the last 168 hours CBC: No results found for this basename: WBC, NEUTROABS, HGB, HCT, MCV, PLT,  in the last 168 hours Cardiac Enzymes: No results found for this basename: CKTOTAL, CKMB, CKMBINDEX, TROPONINI,  in the last 168 hours BNP: BNP (last 3 results) No results found for this basename: PROBNP,  in the last 8760 hours CBG: No results found for this basename: GLUCAP,  in the last 168 hours  Time coordinating discharge: 45 minutes  Signed:  Parry Po  [facility] 03/28/2013, 5:36 PM

## 2013-03-14 LAB — CULTURE, BLOOD (ROUTINE X 2)
Culture: NO GROWTH
Culture: NO GROWTH

## 2013-04-10 ENCOUNTER — Other Ambulatory Visit: Payer: Self-pay | Admitting: Family Medicine

## 2013-04-13 NOTE — Telephone Encounter (Signed)
Last visit 03/02/13 Last refill 03/13/13 #30 0 refill

## 2013-04-13 NOTE — Telephone Encounter (Signed)
Refill for 3 months.  We need to check with [facility].  She had drug screen (mouth swab as she was unable to give urine) back in December and I have still not seen result.

## 2013-04-16 ENCOUNTER — Telehealth: Payer: Self-pay | Admitting: Family Medicine

## 2013-04-16 NOTE — Telephone Encounter (Signed)
Pt following up on request temazepam (RESTORIL) 15 MG capsule pls call daughter in law.

## 2013-04-16 NOTE — Telephone Encounter (Signed)
Can you please call the daughter in law back. And inform her that Dr. Elease Hashimoto would like to see her and the patient for a follow up visit about her medications.

## 2013-04-17 ENCOUNTER — Telehealth: Payer: Self-pay | Admitting: Family Medicine

## 2013-04-17 NOTE — Telephone Encounter (Signed)
Informed daughter in law that Dr. Elease Hashimoto wants to see patient for follow up.

## 2013-04-17 NOTE — Telephone Encounter (Signed)
Informed daughter in law that dr. Elease Hashimoto wanted an ov, she is requesting to speak with the nurse. States pt was just in the office 03/08/13.

## 2013-04-17 NOTE — Telephone Encounter (Signed)
Daughter in law to bring pt in wed at 1:45 pm. Had to use a SD appt in order to get a time they could make.  I hope that was ok,

## 2013-04-22 ENCOUNTER — Ambulatory Visit (INDEPENDENT_AMBULATORY_CARE_PROVIDER_SITE_OTHER): Payer: Medicare Other | Admitting: Family Medicine

## 2013-04-22 ENCOUNTER — Encounter: Payer: Self-pay | Admitting: Family Medicine

## 2013-04-22 VITALS — BP 120/62 | HR 64 | Temp 98.1°F

## 2013-04-22 DIAGNOSIS — D649 Anemia, unspecified: Secondary | ICD-10-CM | POA: Diagnosis not present

## 2013-04-22 DIAGNOSIS — N179 Acute kidney failure, unspecified: Secondary | ICD-10-CM | POA: Diagnosis not present

## 2013-04-22 DIAGNOSIS — F0390 Unspecified dementia without behavioral disturbance: Secondary | ICD-10-CM | POA: Diagnosis not present

## 2013-04-22 DIAGNOSIS — I10 Essential (primary) hypertension: Secondary | ICD-10-CM | POA: Diagnosis not present

## 2013-04-22 DIAGNOSIS — G894 Chronic pain syndrome: Secondary | ICD-10-CM

## 2013-04-22 MED ORDER — TEMAZEPAM 15 MG PO CAPS: ORAL_CAPSULE | ORAL | Status: DC

## 2013-04-22 MED ORDER — OXYCODONE HCL 5 MG PO TABS: 5.0000 mg | ORAL_TABLET | Freq: Four times a day (QID) | ORAL | Status: DC | PRN

## 2013-04-22 NOTE — Progress Notes (Signed)
Pre visit review using our clinic review tool, if applicable. No additional management support is needed unless otherwise documented below in the visit note. 

## 2013-04-22 NOTE — Progress Notes (Signed)
   Subjective:    Patient ID: Heather Jarvis, female    DOB: 1928/07/21, 80 y.o.   MRN: [id number]  HPI Patient here accompanied by daughter-in-law to discuss several issues as follows: Recent hospitalization 03/08/2013 through 03/28/2013. She was admitted with influenza without pneumonia and also had acute renal failure and Escherichia coli pyelonephritis with presumed sepsis. She eventually improved following treatment panel fluid and antibiotics. She was discharged to rehabilitation facility for 3 weeks. Chest x-[place] did not show pneumonia. She was hypotensive and carvedilol was continued and Prinzide and hydralazine were held. She had acute renal failure which improved with hydration. She has chronic fairly progressive dementia. She's not been on Haldol recently. She had previous delusions. She has anemia of chronic disease. She had hyponatremia presumed secondary to dehydration.  Current intake is fair. Daughter-in-law apparently started back all 3 of her blood pressure medications recently. She's not had any orthostatic type symptoms.  She's had chronic back pain has been on oxycodone for many many years. Recent saliva drug screen came back negative for temazepam and oxycodone. Daughter-in-law assures [place] he takes these regularly every single day.  Past Medical History  Diagnosis Date  . Arthritis   . Hyperlipidemia   . Hypertension   . Osteoporosis   . Sick sinus syndrome    Past Surgical History  Procedure Laterality Date  . Appendectomy    . Vesicovaginal fistula closure w/ tah    . Tonsillectomy    . Abdominal hysterectomy    . Pacemaker generator change  02/04/2012    reports that she has quit smoking. Her smoking use included Cigarettes. She has a 25 pack-year smoking history. She quit smokeless tobacco use about 44 years ago. Her smokeless tobacco use included Snuff. She reports that she does not drink alcohol or use illicit drugs. family history includes Diabetes in her mother;  Heart disease in her brother and mother; Hypertension in her mother and sister. No Known Allergies    Review of Systems Unable to obtain from patient    Objective:   Physical Exam  Constitutional:  Patient is demented and cooperative but slightly sedated  HENT:  Mouth/Throat: Oropharynx is clear and moist.  Neck: Neck supple. No thyromegaly present.  Cardiovascular: Normal rate.   Pulmonary/Chest: Effort normal and breath sounds normal. No respiratory distress. She has no wheezes. She has no rales.  Musculoskeletal: She exhibits no edema.  Neurological:  No focal strength deficits          Assessment & Plan:  #1 advanced dementia. At this point she would not benefit from medications for dementia-such as Namenda or Aricept. She is not agitated and agree with holding Haldol at this time. She's not having active delusions #2 chronic pain. She's been on OxyContin for many years. Recent saliva screen negative. Long discussion with daughter-in-law. She assures [place] she is taking this regularly. We'll rescreen today-will try to get urine screen. #3 hypertension. Currently stable. Not hypotensive #4 recent hyponatremia. Recheck basic metabolic panel #5 chronic insomnia. She has for many years been on Restoril. Cautioned about falls. #6 normocytic anemia. Suspect anemia of chronic disease. We discussed with her advanced dementia that further evaluation would not be indicated.

## 2013-04-22 NOTE — Patient Instructions (Signed)
Continue to hold Haldol. Try to reduce her oxycodone dose as instructed.

## 2013-04-23 ENCOUNTER — Telehealth: Payer: Self-pay | Admitting: Family Medicine

## 2013-04-23 NOTE — Telephone Encounter (Signed)
Relevant patient education assigned to patient using Emmi. ° °

## 2013-04-27 ENCOUNTER — Encounter: Payer: Medicare Other | Admitting: *Deleted

## 2013-05-04 ENCOUNTER — Encounter: Payer: Self-pay | Admitting: *Deleted

## 2013-05-12 ENCOUNTER — Encounter: Payer: Self-pay | Admitting: Family Medicine

## 2013-05-17 ENCOUNTER — Other Ambulatory Visit: Payer: Self-pay | Admitting: Family Medicine

## 2013-06-11 ENCOUNTER — Telehealth: Payer: Self-pay | Admitting: Family Medicine

## 2013-06-11 MED ORDER — LISINOPRIL-HYDROCHLOROTHIAZIDE 20-12.5 MG PO TABS: 1.0000 | ORAL_TABLET | Freq: Every day | ORAL | Status: DC

## 2013-06-11 NOTE — Telephone Encounter (Signed)
Rx sent to pharmacy   

## 2013-06-11 NOTE — Telephone Encounter (Signed)
[id number] - [place], [place] - [address] [place] [address] [place] [address] is requesting re-fill on lisinopril-hydrochlorothiazide (PRINZIDE,ZESTORETIC) 20-12.5 MG

## 2013-06-17 ENCOUNTER — Other Ambulatory Visit: Payer: Self-pay | Admitting: Family Medicine

## 2013-08-12 ENCOUNTER — Encounter: Payer: Self-pay | Admitting: Cardiology

## 2013-10-08 ENCOUNTER — Other Ambulatory Visit: Payer: Self-pay | Admitting: Family Medicine

## 2013-12-31 ENCOUNTER — Other Ambulatory Visit: Payer: Self-pay | Admitting: Family Medicine

## 2014-01-02 ENCOUNTER — Other Ambulatory Visit: Payer: Self-pay | Admitting: Family Medicine

## 2014-01-22 ENCOUNTER — Encounter: Payer: Self-pay | Admitting: Internal Medicine

## 2014-01-22 ENCOUNTER — Ambulatory Visit (INDEPENDENT_AMBULATORY_CARE_PROVIDER_SITE_OTHER): Payer: Medicare Other | Admitting: Internal Medicine

## 2014-01-22 VITALS — BP 120/72 | HR 68 | Ht 60.0 in | Wt 126.6 lb

## 2014-01-22 DIAGNOSIS — R001 Bradycardia, unspecified: Secondary | ICD-10-CM | POA: Diagnosis not present

## 2014-01-22 DIAGNOSIS — I10 Essential (primary) hypertension: Secondary | ICD-10-CM

## 2014-01-22 DIAGNOSIS — Z950 Presence of cardiac pacemaker: Secondary | ICD-10-CM | POA: Diagnosis not present

## 2014-01-22 LAB — MDC_IDC_ENUM_SESS_TYPE_INCLINIC
Battery Remaining Longevity: 118.8 mo
Battery Voltage: 2.95 V
Brady Statistic RV Percent Paced: 2.1 %
Date Time Interrogation Session: 20151113160350
Implantable Pulse Generator Model: 2110
Implantable Pulse Generator Serial Number: 7368549
Lead Channel Impedance Value: 475 Ohm
Lead Channel Pacing Threshold Amplitude: 1.25 V
Lead Channel Pacing Threshold Pulse Width: 0.4 ms
Lead Channel Pacing Threshold Pulse Width: 0.4 ms
Lead Channel Pacing Threshold Pulse Width: 0.7 ms
Lead Channel Sensing Intrinsic Amplitude: 6.6 mV
Lead Channel Setting Pacing Amplitude: 1.5 V
Lead Channel Setting Pacing Amplitude: 2.5 V
Lead Channel Setting Pacing Pulse Width: 0.7 ms
Lead Channel Setting Sensing Sensitivity: 2 mV
MDC IDC MSMT LEADCHNL RA IMPEDANCE VALUE: 400 Ohm
MDC IDC MSMT LEADCHNL RA PACING THRESHOLD AMPLITUDE: 0.5 V
MDC IDC MSMT LEADCHNL RA PACING THRESHOLD AMPLITUDE: 0.5 V
MDC IDC MSMT LEADCHNL RA SENSING INTR AMPL: 5 mV
MDC IDC MSMT LEADCHNL RV PACING THRESHOLD AMPLITUDE: 1.25 V
MDC IDC MSMT LEADCHNL RV PACING THRESHOLD PULSEWIDTH: 0.7 ms
MDC IDC STAT BRADY RA PERCENT PACED: 86 %

## 2014-01-22 NOTE — Assessment & Plan Note (Signed)
Her [employer] DDD PM is working normally. Will recheck in several months.  

## 2014-01-22 NOTE — Patient Instructions (Signed)
Your physician wants you to follow-up in: 6 months in the device clinic and 12 months with Dr. Knox Saliva will receive a reminder letter in the mail two months in advance. If you don't receive a letter, please call our office to schedule the follow-up appointment.

## 2014-01-22 NOTE — Progress Notes (Signed)
HPI Heather Jarvis returns today for followup. She is a very pleasant 80 year old woman with a history of symptomatic complete heart block, hypertension, status post permanent pacemaker insertion. She has been stable in the interim. Her daughter is with her today and notes that she does not eat extra salt and has taken her medications. She denies chest pain or shortness of breath. No syncope. No peripheral edema. Her daughter thinks that she is gaining some weight. She has mild dementia. No Known Allergies   Current Outpatient Prescriptions  Medication Sig Dispense Refill  . aspirin 81 MG tablet Take 81 mg by mouth daily.     . calcium-vitamin D (OSCAL WITH D) 500-200 MG-UNIT per tablet Take 1 tablet by mouth 2 (two) times daily.     . carvedilol (COREG) 12.5 MG tablet TAKE 1 TABLET BY MOUTH TWICE DAILY 60 tablet 2  . Cholecalciferol (VITAMIN D) 2000 UNITS CAPS Take 4,000 Units by mouth 2 (two) times daily.     . feeding supplement, ENSURE COMPLETE, (ENSURE COMPLETE) LIQD Take 237 mLs by mouth 2 (two) times daily between meals. 60 Bottle 0  . hydrALAZINE (APRESOLINE) 25 MG tablet TAKE 1 TABLET BY MOUTH TWICE DAILY 60 tablet 2  . lisinopril-hydrochlorothiazide (PRINZIDE,ZESTORETIC) 20-12.5 MG per tablet take 1 tablet by mouth once daily 90 tablet 3  . Multiple Vitamin (MULITIVITAMIN WITH MINERALS) TABS Take 1 tablet by mouth daily. Centrum silver    . Vitamin D, Ergocalciferol, (DRISDOL) 50000 UNITS CAPS capsule Take 50,000 Units by mouth every 7 (seven) days.     No current facility-administered medications for this visit.     Past Medical History  Diagnosis Date  . Arthritis   . Hyperlipidemia   . Hypertension   . Osteoporosis   . Sick sinus syndrome     ROS:   All systems reviewed and negative except as noted in the HPI.   Past Surgical History  Procedure Laterality Date  . Appendectomy    . Vesicovaginal fistula closure w/ tah    . Tonsillectomy    . Abdominal hysterectomy    .  Pacemaker generator change  02/04/2012     Family History  Problem Relation Age of Onset  . Hypertension Mother   . Heart disease Mother   . Diabetes Mother   . Hypertension Sister   . Heart disease Brother      History   Social History  . Marital Status: Widowed    Spouse Name: N/A    Number of Children: N/A  . Years of Education: N/A   Occupational History  . Not on file.   Social History Main Topics  . Smoking status: Former Smoker -- 1.00 packs/day for 25 years    Types: Cigarettes  . Smokeless tobacco: Former Systems developer    Types: Snuff    Quit date: 04/24/1969  . Alcohol Use: No  . Drug Use: No  . Sexual Activity: No   Other Topics Concern  . Not on file   Social History Narrative     BP 120/72 mmHg  Pulse 68  Ht 5' (1.524 m)  Wt 126 lb 9.6 oz (57.425 kg)  BMI 24.72 kg/m2  SpO2 97%  Physical Exam:  Well appearing elderly woman, NAD HEENT: Unremarkable Neck:  7 cm JVD, no thyromegally Lungs:  Clear with no wheezes, rales, or rhonchi. HEART:  Regular rate rhythm, no murmurs, no rubs, no clicks Abd:  soft, positive bowel sounds, no organomegally, no rebound, no guarding Ext:  2  plus pulses, no edema, no cyanosis, no clubbing Skin:  No rashes no nodules Neuro:  CN II through XII intact, motor grossly intact   DEVICE  Normal device function.  See PaceArt for details.   Assess/Plan:

## 2014-01-22 NOTE — Assessment & Plan Note (Signed)
Her blood pressure is normal today. Will follow. She will continue her current meds and maintain a low sodium diet.

## 2014-01-28 ENCOUNTER — Other Ambulatory Visit: Payer: Self-pay | Admitting: Family Medicine

## 2014-02-18 ENCOUNTER — Encounter (HOSPITAL_COMMUNITY): Payer: Self-pay | Admitting: Internal Medicine

## 2014-03-03 ENCOUNTER — Ambulatory Visit (INDEPENDENT_AMBULATORY_CARE_PROVIDER_SITE_OTHER): Payer: Medicare Other | Admitting: Family Medicine

## 2014-03-03 ENCOUNTER — Encounter: Payer: Self-pay | Admitting: Family Medicine

## 2014-03-03 ENCOUNTER — Ambulatory Visit (INDEPENDENT_AMBULATORY_CARE_PROVIDER_SITE_OTHER): Payer: Medicare Other

## 2014-03-03 VITALS — BP 110/68 | HR 58 | Temp 97.7°F | Ht 60.0 in | Wt 127.0 lb

## 2014-03-03 DIAGNOSIS — R300 Dysuria: Secondary | ICD-10-CM

## 2014-03-03 DIAGNOSIS — Z23 Encounter for immunization: Secondary | ICD-10-CM

## 2014-03-03 DIAGNOSIS — I10 Essential (primary) hypertension: Secondary | ICD-10-CM | POA: Diagnosis not present

## 2014-03-03 DIAGNOSIS — M810 Age-related osteoporosis without current pathological fracture: Secondary | ICD-10-CM | POA: Diagnosis not present

## 2014-03-03 DIAGNOSIS — Z0000 Encounter for general adult medical examination without abnormal findings: Secondary | ICD-10-CM | POA: Diagnosis not present

## 2014-03-03 DIAGNOSIS — R600 Localized edema: Secondary | ICD-10-CM

## 2014-03-03 DIAGNOSIS — L601 Onycholysis: Secondary | ICD-10-CM

## 2014-03-03 DIAGNOSIS — F0390 Unspecified dementia without behavioral disturbance: Secondary | ICD-10-CM

## 2014-03-03 LAB — POCT URINALYSIS DIPSTICK
BILIRUBIN UA: NEGATIVE
Glucose, UA: NEGATIVE
KETONES UA: NEGATIVE
Nitrite, UA: NEGATIVE
PH UA: 5.5
PROTEIN UA: NEGATIVE
RBC UA: NEGATIVE
Spec Grav, UA: 1.015
Urobilinogen, UA: 0.2

## 2014-03-03 LAB — BASIC METABOLIC PANEL
BUN: 41 mg/dL — ABNORMAL HIGH (ref 6–23)
CHLORIDE: 109 meq/L (ref 96–112)
CO2: 25 mEq/L (ref 19–32)
CREATININE: 1.3 mg/dL — AB (ref 0.4–1.2)
Calcium: 9.8 mg/dL (ref 8.4–10.5)
GFR: 41.52 mL/min — AB (ref >60.00)
GLUCOSE: 82 mg/dL (ref 70–99)
POTASSIUM: 4.8 meq/L (ref 3.5–5.1)
Sodium: 140 mEq/L (ref 135–145)

## 2014-03-03 LAB — URINALYSIS, MICROSCOPIC ONLY

## 2014-03-03 MED ORDER — HYDRALAZINE HCL 25 MG PO TABS: 25.0000 mg | ORAL_TABLET | Freq: Two times a day (BID) | ORAL | Status: DC

## 2014-03-03 MED ORDER — CARVEDILOL 12.5 MG PO TABS: 12.5000 mg | ORAL_TABLET | Freq: Two times a day (BID) | ORAL | Status: DC

## 2014-03-03 MED ORDER — ALENDRONATE SODIUM 70 MG PO TABS: 70.0000 mg | ORAL_TABLET | ORAL | Status: DC

## 2014-03-03 MED ORDER — LISINOPRIL-HYDROCHLOROTHIAZIDE 20-12.5 MG PO TABS: 1.0000 | ORAL_TABLET | Freq: Every day | ORAL | Status: DC

## 2014-03-03 NOTE — Patient Instructions (Signed)
Advance Directive Advance directives are the legal documents that allow you to make choices about your health care and medical treatment if you cannot speak for yourself. Advance directives are a way for you to communicate your wishes to family, friends, and health care providers. The specified people can then convey your decisions about end-of-life care to avoid confusion if you should become unable to communicate. Ideally, the process of discussing and writing advance directives should happen over time rather than making decisions all at once. Advance directives can be modified as your situation changes, and you can change your mind at any time, even after you have signed the advance directives. Each state has its own laws regarding advance directives. You may want to check with your health care provider, attorney, or state representative about the law in your state. Below are some examples of advance directives. LIVING WILL A living will is a set of instructions documenting your wishes about medical care when you cannot care for yourself. It is used if you become:  Terminally ill.  Incapacitated.  Unable to communicate.  Unable to make decisions. Items to consider in your living will include:  The use or non-use of life-sustaining equipment, such as dialysis machines and breathing machines (ventilators).  A do not resuscitate (DNR) order, which is the instruction not to use cardiopulmonary resuscitation (CPR) if breathing or heartbeat stops.  Tube feeding.  Withholding of food and fluids.  Comfort (palliative) care when the goal becomes comfort rather than a cure.  Organ and tissue donation. A living will does not give instructions about distribution of your money and property if you should pass away. It is advisable to seek the expert advice of a lawyer in drawing up a will regarding your possessions. Decisions about taxes, beneficiaries, and asset distribution will be legally binding.  This process can relieve your family and friends of any burdens surrounding disputes or questions that may come up about the allocation of your assets. DO NOT RESUSCITATE (DNR) A do not resuscitate (DNR) order is a request to not have CPR in the event that your heart stops beating or you stop breathing. Unless given other instructions, a health care provider will try to help any patient whose heart has stopped or who has stopped breathing.  HEALTH CARE PROXY AND DURABLE POWER OF ATTORNEY FOR HEALTH CARE A health care proxy is a person (agent) appointed to make medical decisions for you if you cannot. Generally, people choose someone they know well and trust to represent their preferences when they can no longer do so. You should be sure to ask this person for agreement to act as your agent. An agent may have to exercise judgment in the event of a medical decision for which your wishes are not known. The durable power of attorney for health care is the legal document that names your health care proxy. Once written, it should be:  Signed.  Notarized.  Dated.  Copied.  Witnessed.  Incorporated into your medical record. You may also want to appoint someone to manage your financial affairs if you cannot. This is called a durable power of attorney for finances. It is a separate legal document from the durable power of attorney for health care. You may choose the same person or someone different from your health care proxy to act as your agent in financial matters. Document Released: 06/05/2007 Document Revised: 03/03/2013 Document Reviewed: 07/16/2012 Exit[employer] Patient Information 2015 [employer]. This information is not intended to replace advice   to you by your health care provider. Make sure you discuss any questions you have with your health care provider.  

## 2014-03-03 NOTE — Progress Notes (Signed)
Pre visit review using our clinic review tool, if applicable. No additional management support is needed unless otherwise documented below in the visit note. 

## 2014-03-03 NOTE — Progress Notes (Signed)
Subjective:    Patient ID: Heather Jarvis, female    DOB: 1928-04-01, 80 y.o.   MRN: [id number]  HPI   Patient seen for Medicare wellness exam and medical follow-up. We've not seen her in quite some time. She is accompanied by her daughter-in-law today. She has advanced dementia in addition other medical problems. She is very dependent in her ADLs. She lives with her son and daughter-in-law.  Her chronic problems include history of osteoporosis, hypertension, history depression, dementia, history of hyponatremia. She had sepsis with Escherichia coli last winter. Daughter is concerned because she's recently had some increased odor to her urine and cloudy appearance. Patient does not complain of burning and has not had any fevers or chills.   Mild discoloration left great toe. Daughter-in-law noticed that she had some foul drainage from underneath this recently but not now. No reported injury  Bilateral leg edema. Worse late in the day. No dyspnea. Does not elevate legs. Hypertension treated with lisinopril HCTZ, hydralazine, and carvedilol. Compliant with medications.  Past Medical History  Diagnosis Date  . Arthritis   . Hyperlipidemia   . Hypertension   . Osteoporosis   . Sick sinus syndrome    Past Surgical History  Procedure Laterality Date  . Appendectomy    . Vesicovaginal fistula closure w/ tah    . Tonsillectomy    . Abdominal hysterectomy    . Pacemaker generator change  02/04/2012  . Pacemaker generator change N/A 02/04/2012    Procedure: PACEMAKER GENERATOR CHANGE;  Surgeon: Evans Lance, MD;  [facility] C[facility];  Service: Cardiovascular;  Laterality: N/A;    reports that she has quit smoking. Her smoking use included Cigarettes. She has a 25 pack-year smoking history. She quit smokeless tobacco use about 44 years ago. Her smokeless tobacco use included Snuff. She reports that she does not drink alcohol or use illicit drugs. family history includes Diabetes in her  mother; Heart disease in her brother and mother; Hypertension in her mother and sister. No Known Allergies  1.  Risk factors based on Past Medical , Social, and Family history reviewed and as indicated above with no changes 2.  Limitations in physical activities None.  No recent falls. Very sedentary. Low initiative and motivation secondary to dementia 3.  Depression/mood No active depression or anxiety issues-difficult to assess secondary to her advanced dementia 4.  Hearing subjective hearing loss 5.  ADLs dependent with bathing and dressing 6.  Cognitive function (orientation to time and place, language, writing, speech,memory) advanced dementia. She is not oriented to time or place. Alert and cooperative. She is not capable of performing basic cognitive functions 7.  Home Safety no issues. Daughter recently removed several rugs. Fire arms are locked and kept away 8.  Height, weight, and visual acuity.all stable. 9.  Counseling discussed safety issues 10. Recommendation of preventive services. Flu vaccine. Other immunizations up-to-date. We discussed other screening such as mammography and colonoscopy and they are not interested in pursuing any those given her age and advanced dementia 56. Labs based on risk factors basic metabolic panel 12. Care Plan as above 13. Other Providers none regularly-other than cardiology 14. Written schedule of screening/prevention services given to patient. 15.  Advanced Directives discussed.  DNR order signed.  Daughter in law with look into Living Will and durable Health Care Power     Review of Systems Unobtainable from patient    Objective:   Physical Exam  Constitutional: She appears well-developed and well-nourished. No  distress.  HENT:  Right Ear: External ear normal.  Left Ear: External ear normal.  Mouth/Throat: Oropharynx is clear and moist.  Neck: Neck supple. No JVD present.  Cardiovascular: Normal rate.   Murmur heard. Pulmonary/Chest:  Effort normal and breath sounds normal. No respiratory distress. She has no wheezes. She has no rales.  Abdominal: Soft. There is no tenderness.  Musculoskeletal:  Only trace nonpitting bilateral leg edema  Lymphadenopathy:    She has no cervical adenopathy.  Neurological: She is alert.  Skin:  Left great toe is examined. She has what appears to be some mild thickening and probable onycholysis. No signs of cellulitis or skin infection          Assessment & Plan:  #1 wellness exam. Discussed fall prevention. Daughter-in-law has already taken steps to reduce this. Flu vaccine given. We've recommended for now I'll try to get earlier in the season #2 advanced dementia. We had a long discussion with daughter and we have not recommended medications given the advanced nature and explained these would not be very useful this point. She's not having any issues with agitation #3 bilateral leg edema. Suspect combination of venous stasis possibly some diastolic dysfunction. Symptoms are mild and we recommended elevation and compression #4 hypertension stable. Refill medication for one year. Check basic metabolic panel #5 Onycholysis left great toenail. We discussed risks for secondary infection such as Pseudomonas. Currently no signs of infection. Reassurance #6 osteoporosis.  Fosamax 70 mg once weekly.  In past has taken Boniva but we have no samples and they wish to try Fosamax secondary to less cost.  Instructions given for use.

## 2014-03-04 ENCOUNTER — Other Ambulatory Visit: Payer: Self-pay | Admitting: Family Medicine

## 2014-03-04 MED ORDER — CEPHALEXIN 500 MG PO CAPS: 500.0000 mg | ORAL_CAPSULE | Freq: Four times a day (QID) | ORAL | Status: DC

## 2014-03-22 ENCOUNTER — Other Ambulatory Visit: Payer: Self-pay | Admitting: Family Medicine

## 2014-03-25 ENCOUNTER — Other Ambulatory Visit: Payer: Self-pay | Admitting: Family Medicine

## 2014-04-26 ENCOUNTER — Telehealth: Payer: Self-pay | Admitting: Cardiology

## 2014-04-26 ENCOUNTER — Encounter: Payer: Medicare Other | Admitting: *Deleted

## 2014-04-26 NOTE — Telephone Encounter (Signed)
LMOVM reminding pt to send remote transmission.   

## 2014-04-27 ENCOUNTER — Encounter: Payer: Self-pay | Admitting: Cardiology

## 2014-07-15 ENCOUNTER — Telehealth: Payer: Self-pay | Admitting: Internal Medicine

## 2014-07-15 NOTE — Telephone Encounter (Signed)
Updated Heather Jarvis remote was not received. [name] claims she saw a successful send b/c the "stars" illuminated. No remote received but transmitter does show update as of 06/24/14.   [name] will resend on 07/16/14.

## 2014-07-15 NOTE — Telephone Encounter (Signed)
NEw Message   Pt wants to speak w/ Device to see if remote was sent successfully; please call back and discuss.

## 2014-07-15 NOTE — Telephone Encounter (Signed)
LMOVM w/ my direct #. 

## 2014-07-23 NOTE — Telephone Encounter (Signed)
Heather Jarvis has not yet sent a manual transmission. She has not been feeling well (she sounded ill over the phone). She states she will send a remote for Heather Jarvis when she gets a chance. Per Ms. Al Jarvis, Heather Jarvis is not having new symptoms or other concerns.

## 2014-09-02 ENCOUNTER — Ambulatory Visit (INDEPENDENT_AMBULATORY_CARE_PROVIDER_SITE_OTHER): Payer: Medicare Other | Admitting: *Deleted

## 2014-09-02 ENCOUNTER — Encounter: Payer: Self-pay | Admitting: Internal Medicine

## 2014-09-02 DIAGNOSIS — R001 Bradycardia, unspecified: Secondary | ICD-10-CM

## 2014-09-06 NOTE — Progress Notes (Signed)
Remote pacemaker transmission.   

## 2014-09-09 LAB — CUP PACEART REMOTE DEVICE CHECK
Battery Remaining Percentage: 91 %
Battery Voltage: 2.95 V
Brady Statistic AP VP Percent: 9.9 %
Brady Statistic AS VP Percent: 1 %
Brady Statistic AS VS Percent: 10 %
Brady Statistic RA Percent Paced: 89 %
Date Time Interrogation Session: 20160623201703
Lead Channel Impedance Value: 350 Ohm
Lead Channel Impedance Value: 580 Ohm
Lead Channel Pacing Threshold Amplitude: 0.5 V
Lead Channel Sensing Intrinsic Amplitude: 5 mV
Lead Channel Setting Pacing Amplitude: 1.5 V
Lead Channel Setting Pacing Amplitude: 2.5 V
Lead Channel Setting Sensing Sensitivity: 2 mV
MDC IDC MSMT BATTERY REMAINING LONGEVITY: 107 mo
MDC IDC MSMT LEADCHNL RA PACING THRESHOLD PULSEWIDTH: 0.4 ms
MDC IDC MSMT LEADCHNL RV PACING THRESHOLD AMPLITUDE: 1.25 V
MDC IDC MSMT LEADCHNL RV PACING THRESHOLD PULSEWIDTH: 0.7 ms
MDC IDC MSMT LEADCHNL RV SENSING INTR AMPL: 7.6 mV
MDC IDC PG SERIAL: 7368549
MDC IDC SET LEADCHNL RV PACING PULSEWIDTH: 0.7 ms
MDC IDC STAT BRADY AP VS PERCENT: 80 %
MDC IDC STAT BRADY RV PERCENT PACED: 9.9 %

## 2014-10-06 ENCOUNTER — Encounter: Payer: Self-pay | Admitting: *Deleted

## 2014-10-16 DIAGNOSIS — R40241 Glasgow coma scale score 13-15: Secondary | ICD-10-CM | POA: Diagnosis not present

## 2014-12-06 ENCOUNTER — Encounter: Payer: Medicare Other | Admitting: *Deleted

## 2014-12-06 ENCOUNTER — Telehealth: Payer: Self-pay | Admitting: Cardiology

## 2014-12-06 NOTE — Telephone Encounter (Signed)
Confirmed remote transmission w/ pt daughter in law.   

## 2014-12-07 ENCOUNTER — Encounter: Payer: Self-pay | Admitting: Cardiology

## 2014-12-16 ENCOUNTER — Ambulatory Visit (INDEPENDENT_AMBULATORY_CARE_PROVIDER_SITE_OTHER): Payer: Medicare Other | Admitting: *Deleted

## 2014-12-16 DIAGNOSIS — R001 Bradycardia, unspecified: Secondary | ICD-10-CM | POA: Diagnosis not present

## 2014-12-17 NOTE — Progress Notes (Signed)
Remote pacemaker transmission.   

## 2014-12-27 ENCOUNTER — Telehealth: Payer: Self-pay | Admitting: Family Medicine

## 2014-12-27 NOTE — Telephone Encounter (Addendum)
Pt daughter in law needs a letter stating pt is no longer on haldol and she is not schizophrenia. This is for medical insurance purpose

## 2014-12-29 NOTE — Telephone Encounter (Signed)
Letter ready for pick up. Pt daughter will come and pick it up

## 2014-12-29 NOTE — Telephone Encounter (Signed)
Letter done

## 2015-01-03 LAB — CUP PACEART REMOTE DEVICE CHECK
Battery Remaining Longevity: 96 mo
Battery Voltage: 2.93 V
Brady Statistic RA Percent Paced: 90 %
Brady Statistic RV Percent Paced: 8.8 %
Implantable Lead Implant Date: 20080617
Implantable Lead Implant Date: 20080617
Implantable Lead Location: 753859
Lead Channel Impedance Value: 410 Ohm
Lead Channel Pacing Threshold Amplitude: 0.5 V
Lead Channel Pacing Threshold Pulse Width: 0.4 ms
Lead Channel Sensing Intrinsic Amplitude: 3 mV
Lead Channel Sensing Intrinsic Amplitude: 6 mV
Lead Channel Setting Pacing Amplitude: 2.5 V
Lead Channel Setting Pacing Pulse Width: 0.7 ms
MDC IDC LEAD LOCATION: 753860
MDC IDC MSMT BATTERY REMAINING PERCENTAGE: 81 %
MDC IDC MSMT LEADCHNL RV IMPEDANCE VALUE: 560 Ohm
MDC IDC SESS DTM: 20161024132039
MDC IDC SET LEADCHNL RA PACING AMPLITUDE: 1.5 V
MDC IDC SET LEADCHNL RV SENSING SENSITIVITY: 2 mV
Pulse Gen Serial Number: 7368549

## 2015-01-07 ENCOUNTER — Encounter: Payer: Self-pay | Admitting: Cardiology

## 2015-01-07 ENCOUNTER — Encounter: Payer: Self-pay | Admitting: Internal Medicine

## 2015-01-25 ENCOUNTER — Encounter: Payer: Medicare Other | Admitting: Internal Medicine

## 2015-02-02 ENCOUNTER — Encounter: Payer: Medicare Other | Admitting: Internal Medicine

## 2015-02-25 ENCOUNTER — Encounter: Payer: Medicare Other | Admitting: Internal Medicine

## 2015-03-03 ENCOUNTER — Encounter: Payer: Self-pay | Admitting: *Deleted

## 2015-03-14 ENCOUNTER — Other Ambulatory Visit: Payer: Self-pay | Admitting: Family Medicine

## 2015-03-23 ENCOUNTER — Other Ambulatory Visit: Payer: Self-pay | Admitting: Family Medicine

## 2015-07-08 ENCOUNTER — Other Ambulatory Visit: Payer: Self-pay | Admitting: Family Medicine

## 2015-07-12 ENCOUNTER — Other Ambulatory Visit: Payer: Self-pay | Admitting: Family Medicine

## 2015-07-26 ENCOUNTER — Ambulatory Visit (INDEPENDENT_AMBULATORY_CARE_PROVIDER_SITE_OTHER): Payer: Medicare Other | Admitting: Family Medicine

## 2015-07-26 VITALS — BP 130/80 | HR 68 | Temp 98.2°F | Ht 60.0 in | Wt 143.9 lb

## 2015-07-26 DIAGNOSIS — C4491 Basal cell carcinoma of skin, unspecified: Secondary | ICD-10-CM | POA: Diagnosis not present

## 2015-07-26 DIAGNOSIS — I10 Essential (primary) hypertension: Secondary | ICD-10-CM | POA: Diagnosis not present

## 2015-07-26 DIAGNOSIS — M810 Age-related osteoporosis without current pathological fracture: Secondary | ICD-10-CM

## 2015-07-26 DIAGNOSIS — B351 Tinea unguium: Secondary | ICD-10-CM | POA: Diagnosis not present

## 2015-07-26 DIAGNOSIS — F0390 Unspecified dementia without behavioral disturbance: Secondary | ICD-10-CM

## 2015-07-26 DIAGNOSIS — Z0000 Encounter for general adult medical examination without abnormal findings: Secondary | ICD-10-CM | POA: Diagnosis not present

## 2015-07-26 LAB — BASIC METABOLIC PANEL
BUN: 25 mg/dL — ABNORMAL HIGH (ref 6–23)
CHLORIDE: 106 meq/L (ref 96–112)
CO2: 27 meq/L (ref 19–32)
Calcium: 9.9 mg/dL (ref 8.4–10.5)
Creatinine, Ser: 0.88 mg/dL (ref 0.40–1.20)
GFR: 64.91 mL/min (ref >60.00)
GLUCOSE: 87 mg/dL (ref 70–99)
Potassium: 4.8 mEq/L (ref 3.5–5.1)
SODIUM: 140 meq/L (ref 135–145)

## 2015-07-26 MED ORDER — HYDRALAZINE HCL 25 MG PO TABS: 25.0000 mg | ORAL_TABLET | Freq: Two times a day (BID) | ORAL | Status: DC

## 2015-07-26 MED ORDER — CARVEDILOL 12.5 MG PO TABS: 12.5000 mg | ORAL_TABLET | Freq: Two times a day (BID) | ORAL | Status: DC

## 2015-07-26 MED ORDER — LISINOPRIL-HYDROCHLOROTHIAZIDE 20-12.5 MG PO TABS: 1.0000 | ORAL_TABLET | Freq: Every day | ORAL | Status: DC

## 2015-07-26 NOTE — Patient Instructions (Signed)
Stop the Fosamax for now Get yearly flu vaccine Health Maintenance  Topic Date Due  . ZOSTAVAX  07/25/2016 (Originally 08/04/1990)  . TETANUS/TDAP  07/25/2016 (Originally 08/03/1949)  . INFLUENZA VACCINE  10/11/2015  . DEXA SCAN  Completed  . PNA vac Low Risk Adult  Completed

## 2015-07-26 NOTE — Progress Notes (Signed)
Pre visit review using our clinic review tool, if applicable. No additional management support is needed unless otherwise documented below in the visit note. 

## 2015-07-26 NOTE — Progress Notes (Signed)
Subjective:    Patient ID: Heather Jarvis, female    DOB: 09/05/1928, 82 y.o.   MRN: [id number]  HPI    Patient is here for Medicare wellness visit and medical follow-up.  Her chronic problems include history of dementia,  Osteoporosis, hypertension.  we have not seen her in over one year. She is accompanied today by her son and daughter-in-law whom she lives with.   Pneumonia vaccines are up-to-date. Last tetanus unknown. They've decided against tetanus or shingles vaccine this time. Family has decided against any further mammograms or colonoscopies which is very reasonable with her dementia and advanced age.   Thickening left great toe. Did notice some blackened discoloration. No history of injury. No complaints of any pain.   Blood pressures been very well controlled by home readings. Usually systolic readings [id number]. She has had no complaints of shortness of breath or any chest pains  History of osteoporosis. She's been on Fosamax for over 5 years. She also takes daily calcium and vitamin D. No recent falls.   She has advanced dementia. No agitation. Family states that she spends much of her day just sitting around watching television. She has not shown any interest in any kind of daytime programs.  Past Medical History  Diagnosis Date  . Arthritis   . Hyperlipidemia   . Hypertension   . Osteoporosis   . Sick sinus syndrome [facility])    Past Surgical History  Procedure Laterality Date  . Appendectomy    . Vesicovaginal fistula closure w/ tah    . Tonsillectomy    . Abdominal hysterectomy    . Pacemaker generator change  02/04/2012  . Pacemaker generator change N/A 02/04/2012    Procedure: PACEMAKER GENERATOR CHANGE;  Surgeon: Evans Lance, MD;  Location: [facility] CATH LAB;  Service: Cardiovascular;  Laterality: N/A;    reports that she has quit smoking. Her smoking use included Cigarettes. She has a 25 pack-year smoking history. She quit smokeless tobacco use about 46 years ago. Her  smokeless tobacco use included Snuff. She reports that she does not drink alcohol or use illicit drugs. family history includes Diabetes in her mother; Heart disease in her brother and mother; Hypertension in her mother and sister. No Known Allergies  1.  Risk factors based on Past Medical , Social, and Family history reviewed and as indicated above with no changes 2.  Limitations in physical activities None.  No recent falls. 3.  Depression/mood No active depression or anxiety issues- history limited reliability from patient 4.  Hearing  No major subjective deficits. No recent testing 5.  ADLs independent in all. 6.  Cognitive function (orientation to time and place, language, writing, speech,memory)  Patient has impairment with short-term memory. She is not oriented to day week or year. They declined medications in the past. No recent behavioral disturbance. No major language deficits. No speech deficits. 7.  Home Safety no issues 8.  Height, weight, and visual acuity.all stable. 9.  Counseling discussed  Discontinue Fosamax. Continue regular calcium and vitamin D 10. Recommendation of preventive services. Continue yearly flu vaccine. 11. Labs based on risk factors  Basic metabolic panel 12. Care Plan as above. 13. Other Providers- none regularly 14. Written schedule of screening/prevention services given to patient.    Review of Systems  Constitutional: Negative for fever, chills, appetite change and unexpected weight change.  HENT: Negative for trouble swallowing.   Respiratory: Negative for cough and shortness of breath.   Cardiovascular: Negative for chest  pain, palpitations and leg swelling.  Gastrointestinal: Negative for nausea, vomiting, abdominal pain and diarrhea.  Genitourinary: Negative for dysuria.  Musculoskeletal: Negative for arthralgias.  Neurological: Negative for dizziness, syncope and headaches.  Hematological: Negative for adenopathy.  Psychiatric/Behavioral:  Positive for confusion. Negative for agitation.    limited reliability of history from patient secondary to advanced dementia    Objective:   Physical Exam  Constitutional: She appears well-developed and well-nourished.  HENT:  Right Ear: External ear normal.  Left Ear: External ear normal.  Mouth/Throat: Oropharynx is clear and moist.  Eyes: Pupils are equal, round, and reactive to light.  Neck: Neck supple. No thyromegaly present.  Cardiovascular: Normal rate and regular rhythm.   Pulmonary/Chest: Effort normal and breath sounds normal. No respiratory distress. She has no wheezes. She has no rales.  Abdominal: Soft. Bowel sounds are normal. She exhibits no distension and no mass. There is no tenderness. There is no rebound and no guarding.  Musculoskeletal: She exhibits no edema.  Lymphadenopathy:    She has no cervical adenopathy.  Neurological: She is alert. No cranial nerve deficit.  Skin:  Left trapezius area patient has a 12 x 7 mm nodular lesion with slightly ulcerative center  Left forearm irregular 1.5 x 2 cm area which is slightly nodular and somewhat excoriated with slightly ulcerative center  Very thickened and dystrophic left great toenail          Assessment & Plan:   #1 Medicare wellness visit. Reminder for yearly flu vaccine. They're not interested in pursuing shingles vaccine or tetanus at this point. We had a discussion regarding pros and cons of further screening such as mammography and family has decided against at this point   #2 hypertension stable on repeat reading. Refill all medications today. Check basic metabolic panel   #3 onychomycosis left great toenail. This is not really bothering her in any way. We've not recommended treatment. We explained only effective would be systemic (oral) Lamisil but would not put her through treatment if this is not causing problems   #4 very high probability basal cell carcinoma left neck region. She also has possible  basal cell left forearm. They will schedule follow-up for excision biopsy   #5 history of osteoporosis. She's been on Fosamax for over 5 years. We recommended discontinuing this point. Continue calcium and vitamin D.   #6 advanced dementia. No major behavioral disturbance. Very supportive family. Family does a good job of keeping her engaged as much as possible during the day. She is up some at night awake but is not wandering or agitated  Eulas Post MD [facility]

## 2015-08-03 ENCOUNTER — Encounter: Payer: Self-pay | Admitting: Family Medicine

## 2015-08-09 ENCOUNTER — Ambulatory Visit (INDEPENDENT_AMBULATORY_CARE_PROVIDER_SITE_OTHER): Payer: Medicare Other | Admitting: Family Medicine

## 2015-08-09 VITALS — BP 130/90 | HR 78 | Temp 97.8°F | Ht 60.0 in | Wt 144.4 lb

## 2015-08-09 DIAGNOSIS — C4441 Basal cell carcinoma of skin of scalp and neck: Secondary | ICD-10-CM

## 2015-08-09 DIAGNOSIS — C4491 Basal cell carcinoma of skin, unspecified: Secondary | ICD-10-CM

## 2015-08-09 NOTE — Progress Notes (Signed)
   Subjective:    Patient ID: Heather Jarvis, female    DOB: December 08, 1928, 82 y.o.   MRN: [id number]  HPI   Procedure only visit. Patient is here for excision left lower neck mass (very likely basal cell cancer) Noted recently on exam. Suspicion for basal cell carcinoma. No prior history of skin cancer No associated pain, bleeding, or itching.  Past Medical History  Diagnosis Date  . Arthritis   . Hyperlipidemia   . Hypertension   . Osteoporosis   . Sick sinus syndrome [facility])    Past Surgical History  Procedure Laterality Date  . Appendectomy    . Vesicovaginal fistula closure w/ tah    . Tonsillectomy    . Abdominal hysterectomy    . Pacemaker generator change  02/04/2012  . Pacemaker generator change N/A 02/04/2012    Procedure: PACEMAKER GENERATOR CHANGE;  Surgeon: Evans Lance, MD;  Location: [facility] CATH LAB;  Service: Cardiovascular;  Laterality: N/A;    reports that she has quit smoking. Her smoking use included Cigarettes. She has a 25 pack-year smoking history. She quit smokeless tobacco use about 46 years ago. Her smokeless tobacco use included Snuff. She reports that she does not drink alcohol or use illicit drugs. family history includes Diabetes in her mother; Heart disease in her brother and mother; Hypertension in her mother and sister. No Known Allergies    Review of Systems Pt has advanced dementia. No ROS obtained from patient She has no complaints.    Objective:   Physical Exam  Constitutional: She appears well-developed and well-nourished.  Cardiovascular: Normal rate.   Pulmonary/Chest: Effort normal and breath sounds normal. No respiratory distress. She has no wheezes. She has no rales.  Neurological: She is alert.  Skin:  1.1 cm nodular skin lesion left lower posterior neck region Umbilicated and slightly scabbed center.          Assessment & Plan:  Probable basal cell carcinoma left lower neck. We discussed with patient and her son risk and  benefits of excision including risk of bleeding, infection, scarring. Patient and her son consented (she has dementia and he has POA). Skin anesthesia with 1% plain Xylocaine. Skin prepped with Betadine. Using #15 blade we made an excisional biopsy of the area which was slightly over 1 cm diameter. Length of incision was approximately 4 cm. She did have some mild bleeding controlled with pressure. Closed with 6 sutures of 4-0 Ethilon.   Topical antibiotic and dressing applied. Return in 8 days for suture removal. Specimen sent to pathology for further evaluation  Eulas Post MD [facility]

## 2015-08-09 NOTE — Patient Instructions (Signed)
Keep wound dry for the first 24 hours then clean daily with soap and water for one week. Apply topical antibiotic daily for 3-4 days. Keep covered with clean dressing for 4-5 days. Follow up promptly for any signs of infection such as redness, warmth, pain, or drainage.  

## 2015-08-09 NOTE — Progress Notes (Signed)
Pre visit review using our clinic review tool, if applicable. No additional management support is needed unless otherwise documented below in the visit note. 

## 2015-08-18 ENCOUNTER — Ambulatory Visit: Payer: Medicare Other | Admitting: Family Medicine

## 2015-08-18 ENCOUNTER — Ambulatory Visit (INDEPENDENT_AMBULATORY_CARE_PROVIDER_SITE_OTHER): Payer: Medicare Other | Admitting: Family Medicine

## 2015-08-18 VITALS — BP 130/80 | HR 90 | Temp 97.8°F | Ht 60.0 in | Wt 144.0 lb

## 2015-08-18 DIAGNOSIS — R229 Localized swelling, mass and lump, unspecified: Secondary | ICD-10-CM | POA: Diagnosis not present

## 2015-08-18 DIAGNOSIS — I10 Essential (primary) hypertension: Secondary | ICD-10-CM

## 2015-08-18 DIAGNOSIS — C4491 Basal cell carcinoma of skin, unspecified: Secondary | ICD-10-CM

## 2015-08-18 DIAGNOSIS — L570 Actinic keratosis: Secondary | ICD-10-CM | POA: Diagnosis not present

## 2015-08-18 NOTE — Progress Notes (Signed)
Pre visit review using our clinic review tool, if applicable. No additional management support is needed unless otherwise documented below in the visit note. 

## 2015-08-18 NOTE — Patient Instructions (Signed)
Hold aspirin for 2 days prior to procedure and day of procedure.

## 2015-08-18 NOTE — Progress Notes (Signed)
Subjective:    Patient ID: Heather Jarvis, female    DOB: 08/27/1928, 82 y.o.   MRN: [id number]  HPI Here for the following issues  Suture removal left neck. Recent large basal cell skin cancer excision. No difficulties with healing. Minimal itching. No drainage. No surrounding erythema.  Daughter-in-law is with her today. Fairly large scaly lesion right temporal region. Just came up recently. Occasionally tender to palpation. No bleeding.  She has scabbed nodular lesion left forearm. Previously noted. No bleeding. Never biopsied. Present for at least a year Possibly slow growth  Daughter-in-law has questions regarding her blood pressure medications. She reportedly had systolic heart failure prior to moving here several years ago. She's been on regimen of hydralazine, lisinopril HCTZ, and coreg for many years. Daughter-in-law stopped her lisinopril HCTZ about a year ago. She apparently had some type of acute illness and had some dehydration and was told to stop this and she never started back. She had echocardiogram 2013 which showed normal EF. Blood pressures been very stable with current regimen of Coreg and hydralazine. No dizziness. No chest pains.  Past Medical History  Diagnosis Date  . Arthritis   . Hyperlipidemia   . Hypertension   . Osteoporosis   . Sick sinus syndrome [facility])    Past Surgical History  Procedure Laterality Date  . Appendectomy    . Vesicovaginal fistula closure w/ tah    . Tonsillectomy    . Abdominal hysterectomy    . Pacemaker generator change  02/04/2012  . Pacemaker generator change N/A 02/04/2012    Procedure: PACEMAKER GENERATOR CHANGE;  Surgeon: Evans Lance, MD;  Location: [facility] CATH LAB;  Service: Cardiovascular;  Laterality: N/A;    reports that she has quit smoking. Her smoking use included Cigarettes. She has a 25 pack-year smoking history. She quit smokeless tobacco use about 46 years ago. Her smokeless tobacco use included Snuff. She  reports that she does not drink alcohol or use illicit drugs. family history includes Diabetes in her mother; Heart disease in her brother and mother; Hypertension in her mother and sister. No Known Allergies    Review of Systems  Constitutional: Negative for appetite change and unexpected weight change.  Respiratory: Negative for shortness of breath.   Cardiovascular: Negative for chest pain.  Gastrointestinal: Negative for abdominal pain.  Genitourinary: Negative for dysuria.  Neurological: Negative for dizziness and syncope.  Hematological: Negative for adenopathy.  Psychiatric/Behavioral: Negative for sleep disturbance and agitation.       Objective:   Physical Exam  Constitutional: She appears well-developed and well-nourished.  Neck: Neck supple. No JVD present.  Cardiovascular: Normal rate and regular rhythm.   Pulmonary/Chest: Effort normal and breath sounds normal. No respiratory distress. She has no wheezes. She has no rales.  Musculoskeletal: She exhibits no edema.  Skin:  Wound left neck is healing well. 7 sutures removed. No signs of cellulitis. Nontender.  Patient has irregular nodular slightly scabbed lesion dorsal left forearm. Diameter is about 1-1/2 cm.  Right temporal region approximately 1 cm hyperkeratotic lesion without ulceration.          Assessment & Plan:  #1 recent basal cell excision left neck. Sutures removed without difficulty. Follow-up closely for signs of recurrent growth  #2 probable actinic keratosis right face. We suggest treat with liquid nitrogen when they return for that  #3 nodular lesion left forearm. Differential is basal cell carcinoma versus squamous cell. They will return for excisional biopsy. She will hold her aspirin  for 2 days prior to that  #4 hypertension. Past history of reported CHF-systolic failure. Most recent echo revealed no evidence for systolic failure. Blood pressure currently stable off lisinopril HCTZ. They will  continue to hold at this time  Eulas Post MD [facility]

## 2015-08-31 ENCOUNTER — Ambulatory Visit: Payer: Medicare Other | Admitting: Family Medicine

## 2015-09-15 ENCOUNTER — Encounter: Payer: Self-pay | Admitting: Internal Medicine

## 2015-09-15 ENCOUNTER — Ambulatory Visit (INDEPENDENT_AMBULATORY_CARE_PROVIDER_SITE_OTHER): Payer: Medicare Other | Admitting: Internal Medicine

## 2015-09-15 ENCOUNTER — Encounter (INDEPENDENT_AMBULATORY_CARE_PROVIDER_SITE_OTHER): Payer: Self-pay

## 2015-09-15 VITALS — BP 130/68 | HR 62 | Ht 60.0 in | Wt 133.2 lb

## 2015-09-15 DIAGNOSIS — I495 Sick sinus syndrome: Secondary | ICD-10-CM | POA: Diagnosis not present

## 2015-09-15 NOTE — Progress Notes (Signed)
HPI Mrs. Pollman returns today for followup. She is a very pleasant 82 year old woman with a history of symptomatic complete heart block and sinus node dysfunction, hypertension, status post permanent pacemaker insertion. She has been stable in the interim. Her daughter is with her today and notes that she has been fairly sedentary. She denies chest pain or shortness of breath. No syncope. No peripheral edema. She has mild dementia which does not appear to have progressed much.  No Known Allergies   Current Outpatient Prescriptions  Medication Sig Dispense Refill  . aspirin 81 MG tablet Take 81 mg by mouth daily.     . calcium-vitamin D (OSCAL WITH D) 500-200 MG-UNIT per tablet Take 1 tablet by mouth 2 (two) times daily.     . carvedilol (COREG) 12.5 MG tablet Take 1 tablet (12.5 mg total) by mouth 2 (two) times daily. 180 tablet 3  . Cholecalciferol (VITAMIN D) 2000 UNITS CAPS Take 4,000 Units by mouth 2 (two) times daily.     . hydrALAZINE (APRESOLINE) 25 MG tablet Take 1 tablet (25 mg total) by mouth 2 (two) times daily. 180 tablet 3  . hydrochlorothiazide (HYDRODIURIL) 25 MG tablet Take 25 mg by mouth 2 (two) times daily.    . Multiple Vitamin (MULITIVITAMIN WITH MINERALS) TABS Take 1 tablet by mouth daily. Centrum silver     No current facility-administered medications for this visit.     Past Medical History  Diagnosis Date  . Arthritis   . Hyperlipidemia   . Hypertension   . Osteoporosis   . Sick sinus syndrome (HCC)     ROS:   All systems reviewed and negative except as noted in the HPI.   Past Surgical History  Procedure Laterality Date  . Appendectomy    . Vesicovaginal fistula closure w/ tah    . Tonsillectomy    . Abdominal hysterectomy    . Pacemaker generator change  02/04/2012  . Pacemaker generator change N/A 02/04/2012    Procedure: PACEMAKER GENERATOR CHANGE;  Surgeon: Evans Lance, MD;  Location: [facility] C[facility] LAB;  Service: Cardiovascular;  Laterality: N/A;      Family History  Problem Relation Age of Onset  . Hypertension Mother   . Heart disease Mother   . Diabetes Mother   . Hypertension Sister   . Heart disease Brother      Social History   Social History  . Marital Status: Widowed    Spouse Name: N/A  . Number of Children: N/A  . Years of Education: N/A   Occupational History  . Not on file.   Social History Main Topics  . Smoking status: Former Smoker -- 1.00 packs/day for 25 years    Types: Cigarettes  . Smokeless tobacco: Former Systems developer    Types: Snuff    Quit date: 04/24/1969  . Alcohol Use: No  . Drug Use: No  . Sexual Activity: No   Other Topics Concern  . Not on file   Social History Narrative     BP 130/68 mmHg  Pulse 62  Ht 5' (1.524 m)  Wt 133 lb 3.2 oz (60.419 kg)  BMI 26.01 kg/m2  Physical Exam:  Well appearing elderly woman, NAD HEENT: Unremarkable Neck:  7 cm JVD, no thyromegally Lungs:  Clear with no wheezes, rales, or rhonchi. HEART:  Regular rate rhythm, no murmurs, no rubs, no clicks Abd:  soft, positive bowel sounds, no organomegally, no rebound, no guarding Ext:  2 plus pulses, no edema, no cyanosis, no  clubbing Skin:  No rashes no nodules Neuro:  CN II through XII intact, motor grossly intact   DEVICE  Normal device function.  See PaceArt for details.   Assess/Plan: 1. Sinus node dysfunction - she is asymptomatic, s/p PPM 2. HTN - her blood pressure has been controlled. Will follow. 3. PPM - her St. Jude DDD PM is working normally. Will follow.   Mikle Bosworth.D.

## 2015-09-15 NOTE — Patient Instructions (Signed)
Medication Instructions:  Your physician recommends that you continue on your current medications as directed. Please refer to the Current Medication list given to you today.  Labwork: None ordered  Testing/Procedures: None ordered  Follow-Up: Remote monitoring is used to monitor your Pacemaker of ICD from home. This monitoring reduces the number of office visits required to check your device to one time per year. It allows [place] to keep an eye on the functioning of your device to ensure it is working properly. You are scheduled for a device check from home on 12/15/2015. You may send your transmission at any time that day. If you have a wireless device, the transmission will be sent automatically. After your physician reviews your transmission, you will receive a postcard with your next transmission date.  Your physician wants you to follow-up in: 1 year with Dr. Lovena Le.  You will receive a reminder letter in the mail two months in advance. If you don't receive a letter, please call our office to schedule the follow-up appointment.  If you need a refill on your cardiac medications before your next appointment, please call your pharmacy.  Thank you for choosing [facility]!!

## 2015-12-15 ENCOUNTER — Encounter: Payer: Medicare Other | Admitting: *Deleted

## 2015-12-15 ENCOUNTER — Telehealth: Payer: Self-pay | Admitting: Cardiology

## 2015-12-15 NOTE — Telephone Encounter (Signed)
Confirmed remote transmission w/ pt daughter in law.   

## 2015-12-16 ENCOUNTER — Encounter: Payer: Self-pay | Admitting: Cardiology

## 2016-06-06 ENCOUNTER — Ambulatory Visit (INDEPENDENT_AMBULATORY_CARE_PROVIDER_SITE_OTHER): Payer: Medicare HMO | Admitting: Internal Medicine

## 2016-06-06 VITALS — BP 130/80 | HR 105

## 2016-06-06 DIAGNOSIS — I480 Paroxysmal atrial fibrillation: Secondary | ICD-10-CM | POA: Diagnosis not present

## 2016-06-06 MED ORDER — APIXABAN 5 MG PO TABS: 5.0000 mg | ORAL_TABLET | Freq: Two times a day (BID) | ORAL | 0 refills | Status: DC

## 2016-06-06 MED ORDER — FUROSEMIDE 20 MG PO TABS: 20.0000 mg | ORAL_TABLET | Freq: Every day | ORAL | 3 refills | Status: DC

## 2016-06-06 MED ORDER — APIXABAN 2.5 MG PO TABS: 5.0000 mg | ORAL_TABLET | Freq: Two times a day (BID) | ORAL | Status: DC

## 2016-06-06 NOTE — Progress Notes (Signed)
HPI Mrs. Hensler returns today for an unscheduled visit due to the development of atrial fib and sob. She is a very pleasant 83 year old woman with a history of symptomatic complete heart block and sinus node dysfunction, hypertension, status post permanent pacemaker insertion. She has been stable in the interim but has developed worsening sob over the past couple of days and has been found to have atrial fib with a RVR. She notes that her breathing has also been worse. No anginal symptoms and no worsening edema. No syncope. No Known Allergies   Current Outpatient Prescriptions  Medication Sig Dispense Refill  . apixaban (ELIQUIS) 5 MG TABS tablet Take 1 tablet (5 mg total) by mouth 2 (two) times daily. 60 tablet 0  . calcium-vitamin D (OSCAL WITH D) 500-200 MG-UNIT per tablet Take 1 tablet by mouth 2 (two) times daily.     . carvedilol (COREG) 12.5 MG tablet Take 1 tablet (12.5 mg total) by mouth 2 (two) times daily. 180 tablet 3  . Cholecalciferol (VITAMIN D) 2000 UNITS CAPS Take 4,000 Units by mouth 2 (two) times daily.     . furosemide (LASIX) 20 MG tablet Take 1 tablet (20 mg total) by mouth daily. 90 tablet 3  . hydrALAZINE (APRESOLINE) 25 MG tablet Take 1 tablet (25 mg total) by mouth 2 (two) times daily. 180 tablet 3  . Multiple Vitamin (MULITIVITAMIN WITH MINERALS) TABS Take 1 tablet by mouth daily. Centrum silver     No current facility-administered medications for this visit.      Past Medical History:  Diagnosis Date  . Arthritis   . Hyperlipidemia   . Hypertension   . Osteoporosis   . Sick sinus syndrome (HCC)     ROS:   All systems reviewed and negative except as noted in the HPI.   Past Surgical History:  Procedure Laterality Date  . ABDOMINAL HYSTERECTOMY    . APPENDECTOMY    . PACEMAKER GENERATOR CHANGE  02/04/2012  . PACEMAKER GENERATOR CHANGE N/A 02/04/2012   Procedure: PACEMAKER GENERATOR CHANGE;  Surgeon: Evans Lance, MD;  Location: [facility] CATH LAB;  Service:  Cardiovascular;  Laterality: N/A;  . TONSILLECTOMY    . VESICOVAGINAL FISTULA CLOSURE W/ TAH       Family History  Problem Relation Age of Onset  . Hypertension Mother   . Heart disease Mother   . Diabetes Mother   . Hypertension Sister   . Heart disease Brother      Social History   Social History  . Marital status: Widowed    Spouse name: N/A  . Number of children: N/A  . Years of education: N/A   Occupational History  . Not on file.   Social History Main Topics  . Smoking status: Former Smoker    Packs/day: 1.00    Years: 25.00    Types: Cigarettes  . Smokeless tobacco: Former Systems developer    Types: Snuff    Quit date: 04/24/1969  . Alcohol use No  . Drug use: No  . Sexual activity: No   Other Topics Concern  . Not on file   Social History Narrative  . No narrative on file     BP 130/80   Pulse (!) 105   SpO2 98%   Physical Exam:  Well appearing elderly woman, NAD HEENT: Unremarkable Neck:  7 cm JVD, no thyromegally Lungs:  Clear with no wheezes, rales, or rhonchi. HEART:  Regular rate rhythm, no murmurs, no rubs, no clicks Abd:  soft,  positive bowel sounds, no organomegally, no rebound, no guarding Ext:  2 plus pulses, no edema, no cyanosis, no clubbing Skin:  No rashes no nodules Neuro:  CN II through XII intact, motor grossly intact   DEVICE  Normal device function.  See PaceArt for details.   Assess/Plan: 1. Sinus node dysfunction - she is asymptomatic, s/p PPM 2. HTN - her blood pressure has been controlled. Will follow. 3. PPM - her St. Jude DDD PM is working normally. Will follow.  4. Atrial fib - this is a new problem and is associate with sob. I have recommended she start Eliquis and she will stop her anti-coagulation. 5. Diastolic heart failure - while she is not decompensated, she is volume overloaded. I have asked her to start lasix and stop HCTZ. If she does not feel better in a few days, she will go to the ED.  Mikle Bosworth.D.

## 2016-06-06 NOTE — Patient Instructions (Addendum)
Your physician has recommended you make the following change in your medication:  1.) STOP ASPIRIN 2.) STOP HCTZ 3.) START LASIX 20 MG ONCE A DAY 4.) START ELIQUIS 5 MG --ONE TABLET TWO TIMES A DAY  Your physician recommends that you schedule a follow-up appointment in: [place].

## 2016-06-20 ENCOUNTER — Encounter: Payer: Self-pay | Admitting: Internal Medicine

## 2016-06-20 ENCOUNTER — Ambulatory Visit (INDEPENDENT_AMBULATORY_CARE_PROVIDER_SITE_OTHER): Payer: Medicare HMO | Admitting: Internal Medicine

## 2016-06-20 VITALS — BP 132/74 | HR 76 | Ht 62.0 in | Wt 154.0 lb

## 2016-06-20 DIAGNOSIS — Z79899 Other long term (current) drug therapy: Secondary | ICD-10-CM

## 2016-06-20 DIAGNOSIS — I10 Essential (primary) hypertension: Secondary | ICD-10-CM

## 2016-06-20 MED ORDER — CARVEDILOL 12.5 MG PO TABS
18.7250 mg | ORAL_TABLET | Freq: Two times a day (BID) | ORAL | 3 refills | Status: DC
Start: 2016-06-20 — End: 2016-07-22

## 2016-06-20 NOTE — Patient Instructions (Addendum)
Your physician has recommended you make the following change in your medication:  INCREASE  CARVEDILOL TO  18.725 MG  1 1/2 TAB  TWICE  DAILY    Your physician recommends that you return for lab work in:  [place] physician recommends that you schedule a follow-up appointment in:  [place]

## 2016-06-20 NOTE — Progress Notes (Signed)
HPI Heather Jarvis returns today for followup. She is a very pleasant 83 year old woman with a history of symptomatic complete heart block and sinus node dysfunction, hypertension, status post permanent pacemaker insertion. She has been stable in the interim. Her son is with her today and notes that she has been fairly sedentary. When I saw her last she had developed atrial fib with a RVR and was placed on coreg and lasix and her HCTZ was stopped. She denies chest pain and her shortness of breath is improved. No syncope. No peripheral edema. She has mild dementia which does not appear to have progressed much.  No Known Allergies   Current Outpatient Prescriptions  Medication Sig Dispense Refill  . apixaban (ELIQUIS) 5 MG TABS tablet Take 1 tablet (5 mg total) by mouth 2 (two) times daily. 60 tablet 0  . calcium-vitamin D (OSCAL WITH D) 500-200 MG-UNIT per tablet Take 1 tablet by mouth 2 (two) times daily.     . carvedilol (COREG) 12.5 MG tablet Take 1.5 tablets (18.725 mg total) by mouth 2 (two) times daily. 270 tablet 3  . Cholecalciferol (VITAMIN D) 2000 UNITS CAPS Take 4,000 Units by mouth 2 (two) times daily.     . furosemide (LASIX) 20 MG tablet Take 1 tablet (20 mg total) by mouth daily. 90 tablet 3  . hydrALAZINE (APRESOLINE) 25 MG tablet Take 1 tablet (25 mg total) by mouth 2 (two) times daily. 180 tablet 3  . Multiple Vitamin (MULITIVITAMIN WITH MINERALS) TABS Take 1 tablet by mouth daily. Centrum silver     No current facility-administered medications for this visit.      Past Medical History:  Diagnosis Date  . Arthritis   . Hyperlipidemia   . Hypertension   . Osteoporosis   . Sick sinus syndrome (HCC)     ROS:   All systems reviewed and negative except as noted in the HPI.   Past Surgical History:  Procedure Laterality Date  . ABDOMINAL HYSTERECTOMY    . APPENDECTOMY    . PACEMAKER GENERATOR CHANGE  02/04/2012  . PACEMAKER GENERATOR CHANGE N/A 02/04/2012   Procedure:  PACEMAKER GENERATOR CHANGE;  Surgeon: Evans Lance, MD;  Location: [facility] CATH LAB;  Service: Cardiovascular;  Laterality: N/A;  . TONSILLECTOMY    . VESICOVAGINAL FISTULA CLOSURE W/ TAH       Family History  Problem Relation Age of Onset  . Hypertension Mother   . Heart disease Mother   . Diabetes Mother   . Hypertension Sister   . Heart disease Brother      Social History   Social History  . Marital status: Widowed    Spouse name: N/A  . Number of children: N/A  . Years of education: N/A   Occupational History  . Not on file.   Social History Main Topics  . Smoking status: Former Smoker    Packs/day: 1.00    Years: 25.00    Types: Cigarettes  . Smokeless tobacco: Former Systems developer    Types: Snuff    Quit date: 04/24/1969  . Alcohol use No  . Drug use: No  . Sexual activity: No   Other Topics Concern  . Not on file   Social History Narrative  . No narrative on file   ECG - atrial fib with a RVR  BP 132/74   Pulse 76   Ht 5\' 2"  (1.575 m)   Wt 154 lb (69.9 kg)   SpO2 98%   BMI 28.17 kg/m  Physical Exam:  Well appearing elderly woman, NAD HEENT: Unremarkable Neck:  7 cm JVD, no thyromegally Lungs:  Clear with no wheezes, rales, or rhonchi. HEART:  Regular rate rhythm, no murmurs, no rubs, no clicks Abd:  soft, positive bowel sounds, no organomegally, no rebound, no guarding Ext:  2 plus pulses, no edema, no cyanosis, no clubbing Skin:  No rashes no nodules Neuro:  CN II through XII intact, motor grossly intact  ECG - atrial fib with a RVR  Assess/Plan: 1. Sinus node dysfunction - she is asymptomatic, s/p PPM 2. HTN - her blood pressure has been controlled. Will follow. 3. PPM - her St. Jude DDD PM is working normally. Will follow.  4. Atrial fib with a RVR - her rate is still up a bit. She will uptitrate her coreg to 18.375 mg twice daily 5. Chronic diastolic heart failure - I have asked her to continue her lasix. She will undergo a BMP today to make  sure her potassium and creatinine are ok on lasix.   Mikle Bosworth.D.

## 2016-06-21 LAB — BASIC METABOLIC PANEL
BUN / CREAT RATIO: 15 (ref 12–28)
BUN: 18 mg/dL (ref 8–27)
CO2: 29 mmol/L (ref 18–29)
CREATININE: 1.21 mg/dL — AB (ref 0.57–1.00)
Calcium: 11.5 mg/dL — ABNORMAL HIGH (ref 8.7–10.3)
Chloride: 95 mmol/L — ABNORMAL LOW (ref 96–106)
GFR calc non Af Amer: 41 mL/min/{1.73_m2} — ABNORMAL LOW (ref >59)
GFR, EST AFRICAN AMERICAN: 47 mL/min/{1.73_m2} — AB (ref >59)
Glucose: 83 mg/dL (ref 65–99)
Potassium: 3.5 mmol/L (ref 3.5–5.2)
Sodium: 141 mmol/L (ref 134–144)

## 2016-07-02 ENCOUNTER — Other Ambulatory Visit: Payer: Self-pay | Admitting: Internal Medicine

## 2016-07-02 ENCOUNTER — Other Ambulatory Visit: Payer: Self-pay | Admitting: *Deleted

## 2016-07-02 MED ORDER — APIXABAN 5 MG PO TABS: 5.0000 mg | ORAL_TABLET | Freq: Two times a day (BID) | ORAL | 5 refills | Status: DC

## 2016-07-02 NOTE — Telephone Encounter (Signed)
Paper request received for Eliquis 5mg . Pt is 83 yrs old, Wt-69.9kg, 83 yrs old, Crea-1.21 on 06/20/16, last seen by Dr. Lovena Le on 06/20/16. Pt is on the right dose of Eliquis 5mg  & will send in refill.

## 2016-07-03 NOTE — Telephone Encounter (Signed)
Pt last saw Dr Lovena Le on 06/20/16, last labs on 06/20/16 Creat 1.21, age 83, weight 69.9kg, based on specified criteria pt is on appropriate dosage of Eliquis 5mg  BID/ Will refill rx.

## 2016-07-10 ENCOUNTER — Other Ambulatory Visit: Payer: Self-pay | Admitting: Family Medicine

## 2016-07-10 DIAGNOSIS — I10 Essential (primary) hypertension: Secondary | ICD-10-CM

## 2016-07-19 ENCOUNTER — Observation Stay (HOSPITAL_COMMUNITY)
Admission: EM | Admit: 2016-07-19 | Discharge: 2016-07-22 | Disposition: A | Discharge status: Home / Self Care | Payer: Medicare HMO | Attending: Internal Medicine | Admitting: Internal Medicine

## 2016-07-19 ENCOUNTER — Other Ambulatory Visit: Payer: Self-pay | Admitting: Internal Medicine

## 2016-07-19 ENCOUNTER — Emergency Department (HOSPITAL_COMMUNITY): Payer: Medicare HMO

## 2016-07-19 ENCOUNTER — Encounter (HOSPITAL_COMMUNITY): Payer: Self-pay | Admitting: Emergency Medicine

## 2016-07-19 DIAGNOSIS — I480 Paroxysmal atrial fibrillation: Secondary | ICD-10-CM | POA: Diagnosis not present

## 2016-07-19 DIAGNOSIS — B9620 Unspecified Escherichia coli [E. coli] as the cause of diseases classified elsewhere: Secondary | ICD-10-CM | POA: Insufficient documentation

## 2016-07-19 DIAGNOSIS — G9340 Encephalopathy, unspecified: Secondary | ICD-10-CM | POA: Diagnosis not present

## 2016-07-19 DIAGNOSIS — R55 Syncope and collapse: Secondary | ICD-10-CM | POA: Diagnosis present

## 2016-07-19 DIAGNOSIS — Z87891 Personal history of nicotine dependence: Secondary | ICD-10-CM | POA: Insufficient documentation

## 2016-07-19 DIAGNOSIS — I517 Cardiomegaly: Secondary | ICD-10-CM | POA: Insufficient documentation

## 2016-07-19 DIAGNOSIS — R269 Unspecified abnormalities of gait and mobility: Secondary | ICD-10-CM | POA: Diagnosis not present

## 2016-07-19 DIAGNOSIS — I700 Atherosclerosis of aorta: Secondary | ICD-10-CM | POA: Insufficient documentation

## 2016-07-19 DIAGNOSIS — Z7901 Long term (current) use of anticoagulants: Secondary | ICD-10-CM | POA: Insufficient documentation

## 2016-07-19 DIAGNOSIS — I10 Essential (primary) hypertension: Secondary | ICD-10-CM | POA: Diagnosis not present

## 2016-07-19 DIAGNOSIS — M810 Age-related osteoporosis without current pathological fracture: Secondary | ICD-10-CM | POA: Insufficient documentation

## 2016-07-19 DIAGNOSIS — G309 Alzheimer's disease, unspecified: Secondary | ICD-10-CM | POA: Diagnosis not present

## 2016-07-19 DIAGNOSIS — G894 Chronic pain syndrome: Secondary | ICD-10-CM | POA: Diagnosis not present

## 2016-07-19 DIAGNOSIS — F0281 Dementia in other diseases classified elsewhere with behavioral disturbance: Secondary | ICD-10-CM

## 2016-07-19 DIAGNOSIS — I495 Sick sinus syndrome: Secondary | ICD-10-CM | POA: Insufficient documentation

## 2016-07-19 DIAGNOSIS — Z950 Presence of cardiac pacemaker: Secondary | ICD-10-CM | POA: Diagnosis present

## 2016-07-19 DIAGNOSIS — E876 Hypokalemia: Secondary | ICD-10-CM | POA: Diagnosis not present

## 2016-07-19 DIAGNOSIS — K631 Perforation of intestine (nontraumatic): Secondary | ICD-10-CM | POA: Diagnosis not present

## 2016-07-19 DIAGNOSIS — N183 Chronic kidney disease, stage 3 unspecified: Secondary | ICD-10-CM | POA: Diagnosis present

## 2016-07-19 DIAGNOSIS — I129 Hypertensive chronic kidney disease with stage 1 through stage 4 chronic kidney disease, or unspecified chronic kidney disease: Secondary | ICD-10-CM | POA: Insufficient documentation

## 2016-07-19 DIAGNOSIS — I712 Thoracic aortic aneurysm, without rupture: Secondary | ICD-10-CM | POA: Diagnosis not present

## 2016-07-19 DIAGNOSIS — R69 Illness, unspecified: Secondary | ICD-10-CM | POA: Diagnosis not present

## 2016-07-19 DIAGNOSIS — I951 Orthostatic hypotension: Principal | ICD-10-CM | POA: Insufficient documentation

## 2016-07-19 DIAGNOSIS — F0390 Unspecified dementia without behavioral disturbance: Secondary | ICD-10-CM | POA: Diagnosis not present

## 2016-07-19 DIAGNOSIS — E785 Hyperlipidemia, unspecified: Secondary | ICD-10-CM | POA: Diagnosis not present

## 2016-07-19 DIAGNOSIS — N390 Urinary tract infection, site not specified: Secondary | ICD-10-CM | POA: Diagnosis not present

## 2016-07-19 DIAGNOSIS — M1990 Unspecified osteoarthritis, unspecified site: Secondary | ICD-10-CM | POA: Diagnosis not present

## 2016-07-19 DIAGNOSIS — R0682 Tachypnea, not elsewhere classified: Secondary | ICD-10-CM | POA: Diagnosis not present

## 2016-07-19 LAB — COMPREHENSIVE METABOLIC PANEL
ALBUMIN: 3.6 g/dL (ref 3.5–5.0)
ALT: 18 U/L (ref 14–54)
AST: 21 U/L (ref 15–41)
Alkaline Phosphatase: 65 U/L (ref 38–126)
Anion gap: 9 (ref 5–15)
BUN: 16 mg/dL (ref 6–20)
CHLORIDE: 106 mmol/L (ref 101–111)
CO2: 25 mmol/L (ref 22–32)
Calcium: 10.4 mg/dL — ABNORMAL HIGH (ref 8.9–10.3)
Creatinine, Ser: 1.23 mg/dL — ABNORMAL HIGH (ref 0.44–1.00)
GFR calc Af Amer: 45 mL/min — ABNORMAL LOW (ref >60)
GFR, EST NON AFRICAN AMERICAN: 39 mL/min — AB (ref >60)
Glucose, Bld: 98 mg/dL (ref 65–99)
POTASSIUM: 3.7 mmol/L (ref 3.5–5.1)
SODIUM: 140 mmol/L (ref 135–145)
Total Bilirubin: 0.6 mg/dL (ref 0.3–1.2)
Total Protein: 7 g/dL (ref 6.5–8.1)

## 2016-07-19 LAB — CBC WITH DIFFERENTIAL/PLATELET
BASOS ABS: 0 10*3/uL (ref 0.0–0.1)
BASOS PCT: 0 %
EOS ABS: 0 10*3/uL (ref 0.0–0.7)
EOS PCT: 0 %
HCT: 40.7 % (ref 36.0–46.0)
Hemoglobin: 13.5 g/dL (ref 12.0–15.0)
Lymphocytes Relative: 17 %
Lymphs Abs: 1.9 10*3/uL (ref 0.7–4.0)
MCH: 32.8 pg (ref 26.0–34.0)
MCHC: 33.2 g/dL (ref 30.0–36.0)
MCV: 99 fL (ref 78.0–100.0)
MONO ABS: 0.8 10*3/uL (ref 0.1–1.0)
Monocytes Relative: 7 %
Neutro Abs: 8.4 10*3/uL — ABNORMAL HIGH (ref 1.7–7.7)
Neutrophils Relative %: 76 %
PLATELETS: 333 10*3/uL (ref 150–400)
RBC: 4.11 MIL/uL (ref 3.87–5.11)
RDW: 13.8 % (ref 11.5–15.5)
WBC: 11.2 10*3/uL — ABNORMAL HIGH (ref 4.0–10.5)

## 2016-07-19 LAB — LACTIC ACID, PLASMA: LACTIC ACID, VENOUS: 3.2 mmol/L — AB (ref 0.5–1.9)

## 2016-07-19 LAB — MAGNESIUM: MAGNESIUM: 1.7 mg/dL (ref 1.7–2.4)

## 2016-07-19 LAB — MRSA PCR SCREENING: MRSA BY PCR: NEGATIVE

## 2016-07-19 LAB — CBG MONITORING, ED: Glucose-Capillary: 85 mg/dL (ref 65–99)

## 2016-07-19 LAB — I-STAT TROPONIN, ED: TROPONIN I, POC: 0.01 ng/mL (ref 0.00–0.08)

## 2016-07-19 LAB — TSH: TSH: 1.646 u[IU]/mL (ref 0.350–4.500)

## 2016-07-19 MED ORDER — ONDANSETRON HCL 4 MG/2ML IJ SOLN: 4.0000 mg | Freq: Four times a day (QID) | INTRAMUSCULAR | Status: DC | PRN

## 2016-07-19 MED ORDER — SODIUM CHLORIDE 0.9 % IV BOLUS (SEPSIS)
1000.0000 mL | Freq: Once | INTRAVENOUS | Status: AC
  Administered 2016-07-19: 1000 mL via INTRAVENOUS

## 2016-07-19 MED ORDER — CARVEDILOL 6.25 MG PO TABS
18.7250 mg | ORAL_TABLET | Freq: Two times a day (BID) | ORAL | Status: DC
  Administered 2016-07-19 – 2016-07-22 (×6): 18.75 mg via ORAL
  Filled 2016-07-19 (×6): qty 1

## 2016-07-19 MED ORDER — ACETAMINOPHEN 325 MG PO TABS: 650.0000 mg | ORAL_TABLET | Freq: Four times a day (QID) | ORAL | Status: DC | PRN

## 2016-07-19 MED ORDER — APIXABAN 5 MG PO TABS
5.0000 mg | ORAL_TABLET | Freq: Two times a day (BID) | ORAL | Status: DC
  Administered 2016-07-19 – 2016-07-22 (×6): 5 mg via ORAL
  Filled 2016-07-19 (×6): qty 1

## 2016-07-19 MED ORDER — ONDANSETRON HCL 4 MG PO TABS: 4.0000 mg | ORAL_TABLET | Freq: Four times a day (QID) | ORAL | Status: DC | PRN

## 2016-07-19 MED ORDER — ACETAMINOPHEN 650 MG RE SUPP: 650.0000 mg | Freq: Four times a day (QID) | RECTAL | Status: DC | PRN

## 2016-07-19 MED ORDER — POTASSIUM CHLORIDE IN NACL 20-0.9 MEQ/L-% IV SOLN
INTRAVENOUS | Status: DC
  Administered 2016-07-19 – 2016-07-20 (×2): via INTRAVENOUS
  Filled 2016-07-19 (×2): qty 1000

## 2016-07-19 MED ORDER — SODIUM CHLORIDE 0.9% FLUSH
3.0000 mL | Freq: Two times a day (BID) | INTRAVENOUS | Status: DC
  Administered 2016-07-19 – 2016-07-22 (×5): 3 mL via INTRAVENOUS

## 2016-07-19 NOTE — Progress Notes (Signed)
Patient arrived from ED with admitting DX of Syncope, mild confusion but pleasant. Telemetry applied and ccmd called. Skin assessment done with two nurses. Oriented to room.  Bed alarm on and bed in low position.

## 2016-07-19 NOTE — ED Provider Notes (Signed)
[place] DEPT Provider Note   CSN: [id number] Arrival date & time: 07/19/16  1437     History   Chief Complaint Chief Complaint  Patient presents with  . Loss of Consciousness    HPI Heather Jarvis is a 83 y.o. female.  HPI  Remainder of history, ROS, and physical exam limited due to patient's condition (dementia). Additional information was obtained from EMS.  Level V Caveat.  EMS reported that they were called out to the patient's home after she awoke on the floor of the bathroom. They reported that the last thing the patient remembers was going to the bathroom and then waking up on the floor. When they got to her patient had soft blood pressures with normal CBG. EKG revealed atrial fibrillation. At that time patient denied any other physical complaints.  On my assessment patient cannot remember why she was here. She reports that she came here because the "people who brought her here but her blood pressure was low." When asked who called EMS patient reported that her son mother called the EMS. However patient lives at home alone per EMS.   Past Medical History:  Diagnosis Date  . Arthritis   . Hyperlipidemia   . Hypertension   . Osteoporosis   . Sick sinus syndrome [facility])     Patient Active Problem List   Diagnosis Date Noted  . Syncope 07/19/2016  . Basal cell carcinoma 07/26/2015  . Osteoporosis 03/03/2014  . Influenza without pneumonia 03/12/2013  . Acute pyelonephritis 03/12/2013  . Sepsis due to Escherichia coli ([place]) 03/12/2013  . Normocytic anemia 03/11/2013  . Hyponatremia 03/11/2013  . UTI (lower urinary tract infection) 03/08/2013  . ARF (acute renal failure) ([place]) 04/01/2011  . Dementia 04/01/2011  . Cardiac pacemaker in situ 05/02/2010  . DELUSIONAL DISORDER 08/25/2009  . DEPRESSION/ANXIETY 09/10/2008  . CHRONIC PAIN SYNDROME 07/07/2008  . Essential hypertension 07/07/2008    Past Surgical History:  Procedure Laterality Date  . ABDOMINAL  HYSTERECTOMY    . APPENDECTOMY    . PACEMAKER GENERATOR CHANGE  02/04/2012  . PACEMAKER GENERATOR CHANGE N/A 02/04/2012   Procedure: PACEMAKER GENERATOR CHANGE;  Surgeon: Evans Lance, MD;  Location[facility] CATH LAB;  Service: Cardiovascular;  Laterality: N/A;  . TONSILLECTOMY    . VESICOVAGINAL FISTULA CLOSURE W/ TAH      OB History    No data available       Home Medications    Prior to Admission medications   Medication Sig Start Date End Date Taking? Authorizing Provider  calcium-vitamin D (OSCAL WITH D) 500-200 MG-UNIT per tablet Take 1 tablet by mouth 2 (two) times daily.     [provider]  carvedilol (COREG) 12.5 MG tablet Take 1.5 tablets (18.725 mg total) by mouth 2 (two) times daily. 06/20/16   Evans Lance, MD  Cholecalciferol (VITAMIN D) 2000 UNITS CAPS Take 4,000 Units by mouth 2 (two) times daily.     [provider]  ELIQUIS 5 MG TABS tablet TAKE 1 TABLET TWICE DAILY 07/03/16   Evans Lance, MD  furosemide (LASIX) 20 MG tablet Take 1 tablet (20 mg total) by mouth daily. 06/06/16 09/04/16  Evans Lance, MD  hydrALAZINE (APRESOLINE) 25 MG tablet TAKE 1 TABLET BY MOUTH TWICE A DAY 07/10/16   Burchette, Alinda Sierras, MD  Multiple Vitamin (MULITIVITAMIN WITH MINERALS) TABS Take 1 tablet by mouth daily. Centrum silver    [provider]    Family History Family History  Problem  Relation Age of Onset  . Hypertension Mother   . Heart disease Mother   . Diabetes Mother   . Hypertension Sister   . Heart disease Brother     Social History Social History  Substance Use Topics  . Smoking status: Former Smoker    Packs/day: 1.00    Years: 25.00    Types: Cigarettes  . Smokeless tobacco: Former Systems developer    Types: Snuff    Quit date: 04/24/1969  . Alcohol use No     Allergies   Patient has no known allergies.   Review of Systems Review of Systems  Unable to perform ROS: Dementia     Physical Exam Updated Vital Signs BP (!) 151/80 (BP  Location: Left Arm)   Pulse (!) 115   Temp 97.8 F (36.6 C) (Oral)   Resp 18   SpO2 96%   Physical Exam  Constitutional: She appears well-developed and well-nourished. No distress.  HENT:  Head: Normocephalic and atraumatic.  Right Ear: External ear normal.  Left Ear: External ear normal.  Nose: Nose normal.  Eyes: Conjunctivae and EOM are normal. Pupils are equal, round, and reactive to light. Right eye exhibits no discharge. Left eye exhibits no discharge. No scleral icterus.  Neck: Normal range of motion. Neck supple.  Cardiovascular: Normal rate, regular rhythm and normal heart sounds.  Exam reveals no gallop and no friction rub.   No murmur heard. Pulses:      Radial pulses are 2+ on the right side, and 2+ on the left side.       Dorsalis pedis pulses are 2+ on the right side, and 2+ on the left side.  Pulmonary/Chest: Effort normal and breath sounds normal. No stridor. No respiratory distress. She has no wheezes. She has no rales.  Abdominal: Soft. She exhibits no distension. There is no tenderness.  Musculoskeletal: She exhibits no edema or tenderness.       Cervical back: She exhibits no bony tenderness.       Thoracic back: She exhibits no bony tenderness.       Lumbar back: She exhibits no bony tenderness.  Clavicles stable. Chest stable to AP/Lat compression. Pelvis stable to Lat compression. No obvious extremity deformity. No chest or abdominal wall contusion.  Neurological: She is alert. She is disoriented. No cranial nerve deficit or sensory deficit.  Moves all extremities.  Skin: Skin is warm and dry. No rash noted. She is not diaphoretic. No erythema.  Psychiatric: She has a normal mood and affect.  Vitals reviewed.    ED Treatments / Results  Labs (all labs ordered are listed, but only abnormal results are displayed) Labs Reviewed  CBC WITH DIFFERENTIAL/PLATELET - Abnormal; Notable for the following:       Result Value   WBC 11.2 (*)    Neutro Abs 8.4  (*)    All other components within normal limits  COMPREHENSIVE METABOLIC PANEL - Abnormal; Notable for the following:    Creatinine, Ser 1.23 (*)    Calcium 10.4 (*)    GFR calc non Af Amer 39 (*)    GFR calc Af Amer 45 (*)    All other components within normal limits  CBG MONITORING, ED  I-STAT TROPOININ, ED    EKG  EKG Interpretation  Date/Time:  Thursday Jul 19 2016 15:47:20 EDT Ventricular Rate:  92 PR Interval:    QRS Duration: 101 QT Interval:  332 QTC Calculation: 381 R Axis:   44 Text Interpretation:  Afib/flut  and V-paced complexes No further rhythm analysis attempted due to paced rhythm Borderline T abnormalities, lateral leads Otherwise no significant change Confirmed by [facility] MD, Latish Toutant [phone]) on 07/19/2016 5:19:47 PM       Radiology Dg Chest 2 View  Result Date: 07/19/2016 CLINICAL DATA:  Possible syncopal episode. Was using the restroom and awakened on the floor. No current pain EXAM: CHEST  2 VIEW COMPARISON:  Chest x-Zalesky of March 08, 2013 FINDINGS: The lungs are adequately inflated. The interstitial markings remain coarse. The cardiac silhouette is enlarged. There is no pleural effusion. There is no pneumothorax. There is calcification in the wall of the aortic arch. The ICD is in stable position. There is partial compression of the bodies of T9, T10, and T12. IMPRESSION: Chronic cardiomegaly and mild pulmonary vascular congestion. Mild chronic interstitial edema or fibrosis. Thoracic aortic atherosclerosis. 30% compression of the body of T9 which is new since 2013. Chronic partial compressions of T10 and T12. Electronically Signed   By: David  Martinique M.D.   On: 07/19/2016 15:48   Ct Head Wo Contrast  Result Date: 07/19/2016 CLINICAL DATA:  Syncopal episode in restroom.  Confusion. EXAM: CT HEAD WITHOUT CONTRAST TECHNIQUE: Contiguous axial images were obtained from the base of the skull through the vertex without intravenous contrast. COMPARISON:  07/07/2010  FINDINGS: Brain: There is no evidence of acute infarct, intracranial hemorrhage, mass, midline shift, or extra-axial fluid collection. Mild generalized cerebral atrophy is within normal limits for age. Cerebral white matter hypodensities have mildly progressed from 2012 and are nonspecific but compatible with chronic small vessel ischemic disease, relatively mild for age. Vascular: Calcified atherosclerosis at the skullbase. No hyperdense vessel. 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 No fracture. Sinuses/Orbits: Visualized paranasal sinuses and mastoid air cells are clear. Bilateral cataract extraction. Other: None. IMPRESSION: 1. No evidence of acute intracranial abnormality. 2. Mild chronic small vessel ischemic disease. Electronically Signed   By: Logan Bores M.D.   On: 07/19/2016 15:32    Procedures Procedures (including critical care time)  Medications Ordered in ED Medications - No data to display   Initial Impression / Assessment and Plan / ED Course  I have reviewed the triage vital signs and the nursing notes.  Pertinent labs & imaging results that were available during my care of the patient were reviewed by me and considered in my medical decision making (see chart for details).     On review of records patient is on Eliquis. EKG revealed A. fib/flutter at a rate-controlled rhythm. No evidence of acute ischemia. CT head obtained revealed no evidence of intracranial hemorrhage. Screening labs without anemia, electrolyte derangements. Grossly reassuring labs. Unable to obtain orthostatic blood pressures due to her inability to follow commands.  RN reported that the patient was having hallucinations stating that she was seeing people in the room.  Attempted to contact the patient's family was unsuccessful.  Possible vasovagal syncope given that they reported episode occurred in the bathroom however unable to confidently say. Will require  admission for further workup and management. She will likely require placement.    Final Clinical Impressions(s) / ED Diagnoses   Final diagnoses:  Syncope and collapse      Lejend Dalby, Grayce Sessions, MD 07/19/16 1933

## 2016-07-19 NOTE — ED Notes (Signed)
Pt's CBG result was 85. Informed Ronalee Belts - RN.

## 2016-07-19 NOTE — ED Notes (Signed)
Pt attempting to get up from the bed, telling RN that "men are coming in to get her and she needs to get up and go home". Pt reassured and instructed to remain in the bed. EDP notified.

## 2016-07-19 NOTE — Progress Notes (Signed)
MRSA Screen negative.

## 2016-07-19 NOTE — ED Notes (Signed)
Orthostatic VS held. Pt would not follow commands.

## 2016-07-19 NOTE — ED Notes (Signed)
Patient transported to [facility] 

## 2016-07-19 NOTE — ED Triage Notes (Signed)
Pt arrives by [employer] for possible syncope. Pt states she was using restroom and then woke up on the floor. Pt denies any pain at this time. Pt is alert and oriented to person and time.

## 2016-07-19 NOTE — H&P (Signed)
History and Physical  Patient Name: Heather Jarvis     HKV:[id number]    DOB: Feb 20, 1929    DOA: 07/19/2016 PCP: Eulas Post, MD   Patient coming from: Home  Chief Complaint: Fall, syncope?, confusion  HPI: Heather Jarvis is a 83 y.o. female with a past medical history significant for dementia, pAF on apixaban, CHB with pacer, and HTN who presents with suspected syncope and confusion.  The patient is confused and unable to provider her own history and so all history is collected from EMS.  EMS reported being called to the house and finding the patient on the ground in her bathroom.  There was no mention of who called, EMS reported she lives alone.  They reported the patient told them she had fallen, and she thought she had lost consciousness and could not remember what had happened.  She had no other complaints to EMS.  She had soft BP, was in Afib, had normal CBG and was transported.  ED course: -Afebrile, heart rate 89-115, respirations 22, blood pressure 148/62, pulse oxygen normal -Na 140, K 3.7, Cr 1.2 (baseline 1.2), WBC 11.2K, Hgb 13.5 -The patient was not oriented to situation or place -Transaminases normal -Troponin negative -CT head unremarkable -CXR clear -The patient was very orthostatic with standing (BP 564--> 332 systolic) -TRH were asked to evaluate for suspected syncope and confusion     ROS: Review of Systems  Unable to perform ROS: Dementia          Past Medical History:  Diagnosis Date  . Arthritis   . Hyperlipidemia   . Hypertension   . Osteoporosis   . Sick sinus syndrome [facility])     Past Surgical History:  Procedure Laterality Date  . ABDOMINAL HYSTERECTOMY    . APPENDECTOMY    . PACEMAKER GENERATOR CHANGE  02/04/2012  . PACEMAKER GENERATOR CHANGE N/A 02/04/2012   Procedure: PACEMAKER GENERATOR CHANGE;  Surgeon: Evans Lance, MD;  Location: [facility] CATH LAB;  Service: Cardiovascular;  Laterality: N/A;  . TONSILLECTOMY    . VESICOVAGINAL FISTULA  CLOSURE W/ TAH      Social History: Patient lives alone reportedly.  Per chart, she is a former smoker.    No Known Allergies  Family history: family history includes Diabetes in her mother; Heart disease in her brother and mother; Hypertension in her mother and sister.  Prior to Admission medications   Medication Sig Start Date End Date Taking? Authorizing Provider  carvedilol (COREG) 12.5 MG tablet Take 1.5 tablets (18.725 mg total) by mouth 2 (two) times daily. 06/20/16  Yes Evans Lance, MD  ELIQUIS 5 MG TABS tablet TAKE 1 TABLET TWICE DAILY 07/03/16  Yes Evans Lance, MD  furosemide (LASIX) 20 MG tablet Take 1 tablet (20 mg total) by mouth daily. 06/06/16 09/04/16 Yes Evans Lance, MD  hydrALAZINE (APRESOLINE) 25 MG tablet TAKE 1 TABLET BY MOUTH TWICE A DAY 07/10/16  Yes Burchette, Alinda Sierras, MD  calcium-vitamin D (OSCAL WITH D) 500-200 MG-UNIT per tablet Take 1 tablet by mouth 2 (two) times daily.     [provider]  Cholecalciferol (VITAMIN D) 2000 UNITS CAPS Take 4,000 Units by mouth 2 (two) times daily.     [provider]  Multiple Vitamin (MULITIVITAMIN WITH MINERALS) TABS Take 1 tablet by mouth daily. Centrum silver    [provider]       Physical Exam: BP (!) 151/115 (BP Location: Right Arm)   Pulse (!) 101  Temp 98.2 F (36.8 C) (Oral)   Resp 18   Wt 67.6 kg (149 lb 0.5 oz)   SpO2 98%   BMI 27.26 kg/m  General appearance: Elderly adult female, alert and in no acute distress, not oriented to situation, knows she is in a hospital, unclear about how she got here, has urinated herself.   Eyes: Anicteric, conjunctiva pink, lids and lashes normal. PERRL.    ENT: No nasal deformity, discharge, epistaxis.  Hearing normal. OP dry without lesions.   Neck: No neck masses.  Trachea midline.  No thyromegaly/tenderness.  No carotid bruits. Lymph: No cervical or supraclavicular lymphadenopathy. Skin: Warm and dry.  No jaundice.  No suspicious  rashes or lesions. Cardiac: Tachycardic, regular, nl S1-S2, no murmurs appreciated.  Capillary refill is brisk.  JVP not visible.  No LE edema.  Radial pulses 2+ and symmetric. Respiratory: Normal respiratory rate and rhythm.  CTAB without rales or wheezes. Abdomen: Abdomen soft.  No TTP. No ascites, distension, hepatosplenomegaly.   MSK: No deformities or effusions.  No cyanosis or clubbing. Neuro: Cranial nerves 3-12 intact.  Sensation intact to light touch. Speech is fluent.  Muscle strength 5/5 and symmetric.  FTN normal.    Psych: Sensorium intact and responding to questions, attention seems normal.   Affect pleasant.  Judgment and insight appear impaired, patient unsure where exactly she is, how she got here, has no recollection of events from today.     Labs on Admission:  I have personally reviewed following labs and imaging studies: CBC:  Recent Labs Lab 07/19/16 1811  WBC 11.2*  NEUTROABS 8.4*  HGB 13.5  HCT 40.7  MCV 99.0  PLT 932   Basic Metabolic Panel:  Recent Labs Lab 07/19/16 1811 07/19/16 2209  NA 140  --   K 3.7  --   CL 106  --   CO2 25  --   GLUCOSE 98  --   BUN 16  --   CREATININE 1.23*  --   CALCIUM 10.4*  --   MG  --  1.7   GFR: Estimated Creatinine Clearance: 30.1 mL/min (A) (by C-G formula based on SCr of 1.23 mg/dL (H)).  Liver Function Tests:  Recent Labs Lab 07/19/16 1811  AST 21  ALT 18  ALKPHOS 65  BILITOT 0.6  PROT 7.0  ALBUMIN 3.6   No results for input(s): LIPASE, AMYLASE in the last 168 hours. No results for input(s): AMMONIA in the last 168 hours. Coagulation Profile: No results for input(s): INR, PROTIME in the last 168 hours. Cardiac Enzymes: No results for input(s): CKTOTAL, CKMB, CKMBINDEX, TROPONINI in the last 168 hours. BNP (last 3 results) No results for input(s): PROBNP in the last 8760 hours. HbA1C: No results for input(s): HGBA1C in the last 72 hours. CBG:  Recent Labs Lab 07/19/16 1637  GLUCAP 85    Lipid Profile: No results for input(s): CHOL, HDL, LDLCALC, TRIG, CHOLHDL, LDLDIRECT in the last 72 hours. Thyroid Function Tests:  Recent Labs  07/19/16 2209  TSH 1.646   Anemia Panel: No results for input(s): VITAMINB12, FOLATE, FERRITIN, TIBC, IRON, RETICCTPCT in the last 72 hours. Sepsis Labs: Lactic acid 3.2 Invalid input(s): PROCALCITONIN, LACTICIDVEN Recent Results (from the past 240 hour(s))  MRSA PCR Screening     Status: None   Collection Time: 07/19/16  9:35 PM  Result Value Ref Range Status   MRSA by PCR NEGATIVE NEGATIVE Final    Comment:        The GeneXpert  MRSA Assay (FDA approved for NASAL specimens only), is one component of a comprehensive MRSA colonization surveillance program. It is not intended to diagnose MRSA infection nor to guide or monitor treatment for MRSA infections.          Radiological Exams on Admission: Personally reviewed CXR shows no focal opacity or airspace disease: Dg Chest 2 View  Result Date: 07/19/2016 CLINICAL DATA:  Possible syncopal episode. Was using the restroom and awakened on the floor. No current pain EXAM: CHEST  2 VIEW COMPARISON:  Chest x-Human of March 08, 2013 FINDINGS: The lungs are adequately inflated. The interstitial markings remain coarse. The cardiac silhouette is enlarged. There is no pleural effusion. There is no pneumothorax. There is calcification in the wall of the aortic arch. The ICD is in stable position. There is partial compression of the bodies of T9, T10, and T12. IMPRESSION: Chronic cardiomegaly and mild pulmonary vascular congestion. Mild chronic interstitial edema or fibrosis. Thoracic aortic atherosclerosis. 30% compression of the body of T9 which is new since 2013. Chronic partial compressions of T10 and T12. Electronically Signed   By: David  Martinique M.D.   On: 07/19/2016 15:48   Ct Head Wo Contrast  Result Date: 07/19/2016 CLINICAL DATA:  Syncopal episode in restroom.  Confusion. EXAM: CT  HEAD WITHOUT CONTRAST TECHNIQUE: Contiguous axial images were obtained from the base of the skull through the vertex without intravenous contrast. COMPARISON:  07/07/2010 FINDINGS: Brain: There is no evidence of acute infarct, intracranial hemorrhage, mass, midline shift, or extra-axial fluid collection. Mild generalized cerebral atrophy is within normal limits for age. Cerebral white matter hypodensities have mildly progressed from 2012 and are nonspecific but compatible with chronic small vessel ischemic disease, relatively mild for age. Vascular: Calcified atherosclerosis at the skullbase. No hyperdense vessel. 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 No fracture. Sinuses/Orbits: Visualized paranasal sinuses and mastoid air cells are clear. Bilateral cataract extraction. Other: None. IMPRESSION: 1. No evidence of acute intracranial abnormality. 2. Mild chronic small vessel ischemic disease. Electronically Signed   By: Logan Bores M.D.   On: 07/19/2016 15:32    EKG: Independently reviewed. Rate 85, QTc 494, Afib, no St changes.  Nonspecific T wave abnormalities.   Echocardiogram 2013: Report reviewed EF 65% Mild MR PAP 49        Assessment/Plan  1. Altered mental status in setting of dementia:  Baseline unknown but dementia is listed in problem list.  Evidently the patient lives alone.  Attempts to reach her family by phone multiple times at the only known number in our system were unsuccessful, mailbox full.  Evidently a female family member was present in the ER briefly, did not leave a contact number.  AMS appears metabolic.  Dehydration vs progression of dementia.  Stroke doubted. Despite elevated lactic acid, infection doubted. -IV fluids -Culture for fever   2. Syncope:  Not clear history to confirm that she syncopized.  Very orthostatic here, so suspect that she did. -Obtain pacer interrogation -Monitor on tele -Repeat  echocardiogram -Fluids  3. Atrial fibrillation:  CHADS2Vasc 4, on apixaban, carvedilol -Continue apixaban -Continue carvedilol  4. Hypertension:  -Continue carvedilol -Hold furosemide, hydralazine given orthostasis/dehydration  5. CKD: Baseline Cr 1.2, stable        DVT prophylaxis: N/A on apixaban  Code Status: FULL  Family Communication: None reachable  Disposition Plan: Anticipate IV fluids, monitor for fever, trend lactic acid Consults called: None Admission status: OBS At the point of  initial evaluation, it is my clinical opinion that admission for OBSERVATION is reasonable and necessary because the patient's presenting complaints in the context of their chronic conditions represent sufficient risk of deterioration or significant morbidity to constitute reasonable grounds for close observation in the hospital setting, but that the patient may be medically stable for discharge from the hospital within 24 to 48 hours.    Medical decision making: Patient seen at 7:30 PM on 07/19/2016.  The patient was discussed with Dr. Leonette Monarch.  What exists of the patient's chart was reviewed in depth and summarized above.  Clinical condition: stable.        Edwin Dada [facility] Pager [phone]

## 2016-07-19 NOTE — Progress Notes (Signed)
Lactic acid 3.2. MD on call notified.

## 2016-07-20 ENCOUNTER — Observation Stay (HOSPITAL_BASED_OUTPATIENT_CLINIC_OR_DEPARTMENT_OTHER): Payer: Medicare HMO

## 2016-07-20 DIAGNOSIS — I951 Orthostatic hypotension: Secondary | ICD-10-CM | POA: Diagnosis not present

## 2016-07-20 DIAGNOSIS — I340 Nonrheumatic mitral (valve) insufficiency: Secondary | ICD-10-CM | POA: Diagnosis not present

## 2016-07-20 DIAGNOSIS — G9341 Metabolic encephalopathy: Secondary | ICD-10-CM | POA: Diagnosis not present

## 2016-07-20 LAB — CBC
HCT: 36.1 % (ref 36.0–46.0)
Hemoglobin: 11.9 g/dL — ABNORMAL LOW (ref 12.0–15.0)
MCH: 32.4 pg (ref 26.0–34.0)
MCHC: 33 g/dL (ref 30.0–36.0)
MCV: 98.4 fL (ref 78.0–100.0)
PLATELETS: 288 10*3/uL (ref 150–400)
RBC: 3.67 MIL/uL — AB (ref 3.87–5.11)
RDW: 13.8 % (ref 11.5–15.5)
WBC: 9.1 10*3/uL (ref 4.0–10.5)

## 2016-07-20 LAB — BASIC METABOLIC PANEL
Anion gap: 8 (ref 5–15)
BUN: 14 mg/dL (ref 6–20)
CO2: 23 mmol/L (ref 22–32)
Calcium: 9 mg/dL (ref 8.9–10.3)
Chloride: 109 mmol/L (ref 101–111)
Creatinine, Ser: 1.03 mg/dL — ABNORMAL HIGH (ref 0.44–1.00)
GFR calc Af Amer: 56 mL/min — ABNORMAL LOW (ref >60)
GFR calc non Af Amer: 48 mL/min — ABNORMAL LOW (ref >60)
GLUCOSE: 100 mg/dL — AB (ref 65–99)
POTASSIUM: 3.3 mmol/L — AB (ref 3.5–5.1)
Sodium: 140 mmol/L (ref 135–145)

## 2016-07-20 LAB — URINALYSIS, ROUTINE W REFLEX MICROSCOPIC
BILIRUBIN URINE: NEGATIVE
Glucose, UA: NEGATIVE mg/dL
HGB URINE DIPSTICK: NEGATIVE
KETONES UR: NEGATIVE mg/dL
NITRITE: POSITIVE — AB
PH: 7 (ref 5.0–8.0)
Protein, ur: NEGATIVE mg/dL
SPECIFIC GRAVITY, URINE: 1.012 (ref 1.005–1.030)

## 2016-07-20 LAB — ECHOCARDIOGRAM COMPLETE
Height: 62 in
Weight: 2384.5 oz

## 2016-07-20 LAB — TROPONIN I

## 2016-07-20 LAB — GLUCOSE, CAPILLARY: Glucose-Capillary: 88 mg/dL (ref 65–99)

## 2016-07-20 LAB — LACTIC ACID, PLASMA: Lactic Acid, Venous: 1.4 mmol/L (ref 0.5–1.9)

## 2016-07-20 MED ORDER — POTASSIUM CHLORIDE CRYS ER 20 MEQ PO TBCR
40.0000 meq | EXTENDED_RELEASE_TABLET | Freq: Once | ORAL | Status: AC
  Administered 2016-07-20: 40 meq via ORAL
  Filled 2016-07-20: qty 2

## 2016-07-20 NOTE — Evaluation (Signed)
Occupational Therapy Evaluation Patient Details Name: Heather Jarvis MRN: [id number] DOB: 1928/12/17 Today's Date: 07/20/2016    History of Present Illness 83 y.o. female with a past medical history significant for dementia, pAF on apixaban, CHB with pacer, and HTN who presents with suspected syncope and confusion.    Clinical Impression   Pt admitted with the above diagnoses and presents with below problem list. Pt will benefit from continued acute OT to address the below listed deficits and maximize independence with basic ADLs prior to d/c home with family. PTA pt reports she lives with her son who is retired and was independent with ADLs, used a Civil engineer, contracting for bathing. Pt is questionable historian and no family present to confirm home setup. Pt currently set up to min guard with most ADLs,  min A for household distance ambulation. DOE and some wheezing noted during session. Pt anxious about but, with encouragement, agreeable to mobility. Pleasantly confused. Some unsteadiness during turns.      Follow Up Recommendations  Home health OT;Supervision/Assistance - 24 hour    Equipment Recommendations  3 in 1 bedside commode    Recommendations for Other Services       Precautions / Restrictions Precautions Precautions: Fall Restrictions Weight Bearing Restrictions: No      Mobility Bed Mobility Overal bed mobility: Needs Assistance Bed Mobility: Supine to Sit;Sit to Supine     Supine to sit: Min guard Sit to supine: Min guard   General bed mobility comments: extra time and effort. No physical assist needed.   Transfers Overall transfer level: Needs assistance Equipment used: 1 person hand held assist Transfers: Sit to/from Stand Sit to Stand: Min guard         General transfer comment: from recliner, EOB, 3n1. HHA to steady.    Balance Overall balance assessment: Needs assistance Sitting-balance support: No upper extremity supported;Feet supported Sitting  balance-Leahy Scale: Good Sitting balance - Comments: sat to comb hair   Standing balance support: Single extremity supported Standing balance-Leahy Scale: Fair Standing balance comment: +1 HHA for balance                           ADL either performed or assessed with clinical judgement   ADL Overall ADL's : Needs assistance/impaired Eating/Feeding: Set up;Sitting   Grooming: Set up;Brushing hair;Sitting Grooming Details (indicate cue type and reason): sat to comb hair Upper Body Bathing: Set up;Sitting   Lower Body Bathing: Min guard;Sit to/from stand   Upper Body Dressing : Set up;Sitting   Lower Body Dressing: Min guard;Sit to/from stand   Toilet Transfer: Minimal assistance;Min guard;Ambulation Toilet Transfer Details (indicate cue type and reason): +1 HHA, min A at times to steady during ambulation.  Toileting- Water quality scientist and Hygiene: Min guard;Sit to/from stand   Tub/ Shower Transfer: Tub transfer;Minimal assistance;Ambulation;3 in 1   Functional mobility during ADLs: Minimal assistance;Min guard (+1 HHA) General ADL Comments: Pt completed bed mobility, grooming task, and household distance functinoal mobility as detailed above. Pt anxious about OOB/mobility, with encouragement completed mobility. +1 HHA for OOB transfers/mobility     Vision         Perception     Praxis      Pertinent Vitals/Pain Pain Assessment: Faces Faces Pain Scale: No hurt     Hand Dominance     Extremity/Trunk Assessment Upper Extremity Assessment Upper Extremity Assessment: Overall WFL for tasks assessed;Generalized weakness   Lower Extremity Assessment Lower Extremity Assessment: Defer to  PT evaluation       Communication Communication Communication: HOH   Cognition Arousal/Alertness: Awake/alert Behavior During Therapy: WFL for tasks assessed/performed;Anxious Overall Cognitive Status: History of cognitive impairments - at baseline                                  General Comments: able to state age and that she is in a hospital in [place]. Unable to state month or year, name of city. Pleasantly confused. Anxious about mobility.    General Comments  Orthostatic vitals assessed: supine: 129/69, HR 83, sitting 123/99, HR 105, standing 135/89 HR 97. Dyspnea noted; wheezing sounds at end of session.      Exercises     Shoulder Instructions      Home Living Family/patient expects to be discharged to:: Private residence Living Arrangements: Children Available Help at Discharge: Family Type of Home: House Home Access: Stairs to enter Technical brewer of Steps: 3   Home Layout: One level     Bathroom Shower/Tub: Teacher, early years/pre: Standard     Home Equipment: Building services engineer Comments: no family present, home setup from pt who is questionable historian      Prior Functioning/Environment          Comments: pt reports independent with ADLs        OT Problem List: Decreased activity tolerance;Impaired balance (sitting and/or standing);Decreased strength;Decreased cognition;Decreased safety awareness;Decreased knowledge of use of DME or AE;Decreased knowledge of precautions;Cardiopulmonary status limiting activity      OT Treatment/Interventions: Self-care/ADL training;Energy conservation;DME and/or AE instruction;Therapeutic activities;Patient/family education;Balance training    OT Goals(Current goals can be found in the care plan section) Acute Rehab OT Goals Patient Stated Goal: did not state OT Goal Formulation: With patient Time For Goal Achievement: 08/03/16 Potential to Achieve Goals: Good ADL Goals Pt Will Perform Grooming: with modified independence;sitting;standing Pt Will Perform Upper Body Bathing: with set-up;sitting Pt Will Perform Lower Body Bathing: with set-up;sit to/from stand;with supervision Pt Will Perform Upper Body Dressing: with  set-up;sitting Pt Will Perform Lower Body Dressing: with set-up;with supervision;sit to/from stand Pt Will Transfer to Toilet: with supervision Pt Will Perform Toileting - Clothing Manipulation and hygiene: with supervision Pt Will Perform Tub/Shower Transfer: Tub transfer;with supervision;ambulating;shower seat  OT Frequency: Min 2X/week   Barriers to D/C:    family not present to corraborate available help at d/c information       Co-evaluation PT/OT/SLP Co-Evaluation/Treatment: Yes Reason for Co-Treatment: For patient/therapist safety   OT goals addressed during session: ADL's and self-care      AM-PAC PT "6 Clicks" Daily Activity     Outcome Measure Help from another person eating meals?: None Help from another person taking care of personal grooming?: A Little Help from another person toileting, which includes using toliet, bedpan, or urinal?: A Little Help from another person bathing (including washing, rinsing, drying)?: A Little Help from another person to put on and taking off regular upper body clothing?: None Help from another person to put on and taking off regular lower body clothing?: A Little 6 Click Score: 20   End of Session Equipment Utilized During Treatment: Gait belt  Activity Tolerance: Other (comment) (dyspnea) Patient left: in bed;with call bell/phone within reach;with bed alarm set  OT Visit Diagnosis: Unsteadiness on feet (R26.81);History of falling (Z91.81);Other symptoms and signs involving cognitive function  Time: 1205-1232 OT Time Calculation (min): 27 min Charges:  OT General Charges $OT Visit: 1 Procedure OT Evaluation $OT Eval Low Complexity: 1 Procedure G-Codes: OT G-codes **NOT FOR INPATIENT CLASS** Functional Assessment Tool Used: AM-PAC 6 Clicks Daily Activity Functional Limitation: Self care Self Care Current Status (D1761): At least 20 percent but less than 40 percent impaired, limited or restricted Self Care Goal  Status (Y0737): At least 1 percent but less than 20 percent impaired, limited or restricted     Hortencia Pilar 07/20/2016, 12:56 PM
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
## 2016-07-20 NOTE — Plan of Care (Signed)
Problem: Safety: Goal: Ability to remain free from injury will improve Outcome: Progressing Continue to reinforce to use call bell.
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
## 2016-07-20 NOTE — Care Management Obs Status (Signed)
[place] NOTIFICATION   Patient Details  Name: Heather Jarvis MRN: [id number] Date of Birth: [birth year]   Medicare Observation Status Notification Given:  Yes    Dawayne Patricia, RN 07/20/2016, 3:28 PM

## 2016-07-21 DIAGNOSIS — N390 Urinary tract infection, site not specified: Secondary | ICD-10-CM | POA: Diagnosis not present

## 2016-07-21 DIAGNOSIS — I129 Hypertensive chronic kidney disease with stage 1 through stage 4 chronic kidney disease, or unspecified chronic kidney disease: Secondary | ICD-10-CM | POA: Diagnosis not present

## 2016-07-21 DIAGNOSIS — E785 Hyperlipidemia, unspecified: Secondary | ICD-10-CM | POA: Diagnosis not present

## 2016-07-21 DIAGNOSIS — N183 Chronic kidney disease, stage 3 (moderate): Secondary | ICD-10-CM | POA: Diagnosis not present

## 2016-07-21 DIAGNOSIS — R55 Syncope and collapse: Secondary | ICD-10-CM | POA: Diagnosis not present

## 2016-07-21 DIAGNOSIS — R69 Illness, unspecified: Secondary | ICD-10-CM | POA: Diagnosis not present

## 2016-07-21 DIAGNOSIS — G309 Alzheimer's disease, unspecified: Secondary | ICD-10-CM | POA: Diagnosis not present

## 2016-07-21 DIAGNOSIS — I951 Orthostatic hypotension: Secondary | ICD-10-CM | POA: Diagnosis not present

## 2016-07-21 DIAGNOSIS — Z950 Presence of cardiac pacemaker: Secondary | ICD-10-CM | POA: Diagnosis not present

## 2016-07-21 DIAGNOSIS — B9620 Unspecified Escherichia coli [E. coli] as the cause of diseases classified elsewhere: Secondary | ICD-10-CM | POA: Diagnosis not present

## 2016-07-21 DIAGNOSIS — I480 Paroxysmal atrial fibrillation: Secondary | ICD-10-CM | POA: Diagnosis not present

## 2016-07-21 DIAGNOSIS — E876 Hypokalemia: Secondary | ICD-10-CM | POA: Diagnosis not present

## 2016-07-21 DIAGNOSIS — G9340 Encephalopathy, unspecified: Secondary | ICD-10-CM | POA: Diagnosis not present

## 2016-07-21 DIAGNOSIS — F0281 Dementia in other diseases classified elsewhere with behavioral disturbance: Secondary | ICD-10-CM | POA: Diagnosis not present

## 2016-07-21 LAB — BASIC METABOLIC PANEL
ANION GAP: 9 (ref 5–15)
BUN: 21 mg/dL — ABNORMAL HIGH (ref 6–20)
CALCIUM: 8.3 mg/dL — AB (ref 8.9–10.3)
CHLORIDE: 110 mmol/L (ref 101–111)
CO2: 20 mmol/L — AB (ref 22–32)
Creatinine, Ser: 1.18 mg/dL — ABNORMAL HIGH (ref 0.44–1.00)
GFR calc Af Amer: 47 mL/min — ABNORMAL LOW (ref >60)
GFR calc non Af Amer: 41 mL/min — ABNORMAL LOW (ref >60)
Glucose, Bld: 101 mg/dL — ABNORMAL HIGH (ref 65–99)
Potassium: 4.3 mmol/L (ref 3.5–5.1)
Sodium: 139 mmol/L (ref 135–145)

## 2016-07-21 LAB — CBC
HCT: 38.5 % (ref 36.0–46.0)
Hemoglobin: 12.6 g/dL (ref 12.0–15.0)
MCH: 32.7 pg (ref 26.0–34.0)
MCHC: 32.7 g/dL (ref 30.0–36.0)
MCV: 100 fL (ref 78.0–100.0)
PLATELETS: 299 10*3/uL (ref 150–400)
RBC: 3.85 MIL/uL — AB (ref 3.87–5.11)
RDW: 14.4 % (ref 11.5–15.5)
WBC: 11.9 10*3/uL — ABNORMAL HIGH (ref 4.0–10.5)

## 2016-07-21 LAB — MAGNESIUM: MAGNESIUM: 1.7 mg/dL (ref 1.7–2.4)

## 2016-07-21 MED ORDER — LORAZEPAM 2 MG/ML IJ SOLN
0.5000 mg | Freq: Once | INTRAMUSCULAR | Status: AC
  Administered 2016-07-21: 0.5 mg via INTRAVENOUS
  Filled 2016-07-21: qty 1

## 2016-07-21 MED ORDER — LEVALBUTEROL HCL 0.63 MG/3ML IN NEBU
0.6300 mg | INHALATION_SOLUTION | Freq: Four times a day (QID) | RESPIRATORY_TRACT | Status: DC | PRN
  Administered 2016-07-21: 0.63 mg via RESPIRATORY_TRACT
  Filled 2016-07-21: qty 3

## 2016-07-21 MED ORDER — CEPHALEXIN 500 MG PO CAPS
500.0000 mg | ORAL_CAPSULE | Freq: Two times a day (BID) | ORAL | Status: DC
Start: 2016-07-21 — End: 2016-07-22
  Administered 2016-07-21 – 2016-07-22 (×3): 500 mg via ORAL
  Filled 2016-07-21 (×3): qty 1

## 2016-07-21 MED ORDER — METOPROLOL TARTRATE 5 MG/5ML IV SOLN
5.0000 mg | Freq: Once | INTRAVENOUS | Status: AC
  Administered 2016-07-21: 5 mg via INTRAVENOUS
  Filled 2016-07-21: qty 5

## 2016-07-21 NOTE — Progress Notes (Signed)
Received call from RN to give PRN neb tx. Upon RT arrival to pt's room, pt was audibly wheezing, with somewhat of a forced exhalation, but denies any SOB. Pt's lungs are clear bilaterally, SpO2 98% on RA, RR 28, with upper airway wheeze. PRN tx given with no change in Pt's appearance or breath sounds. RT will continue to monitor.

## 2016-07-21 NOTE — Progress Notes (Signed)
Patient HR increased to 140's and BP elevated. Asymptomatic. Denies any discomfort or pain. MD on call notified.

## 2016-07-21 NOTE — Progress Notes (Addendum)
Patient ID: Heather Jarvis, female   DOB: 28-Sep-1928, 83 y.o.   MRN: [id number]                                                                PROGRESS NOTE                                                                                                                                                                                                             Patient Demographics:    Heather Jarvis, is a 83 y.o. female, DOB - 05-Jun-1928, VHQ:[id number]  Admit date - 07/19/2016   Admitting Physician Edwin Dada, MD  Outpatient Primary MD for the patient is Eulas Post, MD  LOS - 0  Outpatient Specialists: Cristopher Peru   Chief Complaint  Patient presents with  . Loss of Consciousness       Brief Narrative   83 y.o.femalewith a past medical history significant for dementia, pAF on apixaban, CHB with pacer, and HTNwho presents with suspected syncope and confusion. Apparently, EMS was called to the house after someone at home found the patient on the ground in her bathroom.   I was able to get a hold of daughter-in-law over the phone today for further history. Apparently, patient, son and daughter-in-law live together. Yesterday, patient was going to the bathroom and sitting on the toilet without any complaints. She was on the toilet when she slumped forward. Per her daughter-in-law, she thinks patient had stopped breathing for an hour but is unsure as she was in a state of panic at the time. EMS was called, administered ammonia stick to under her nose, and patient woke up. No report of hitting her head with syncope, no seizure-like activity    Subjective:    Heather Jarvis today has no further syncope, or dizziness.  Pt is somewhat of a poor historian.  , No headache, No chest pain, No abdominal pain - No Nausea, No new weakness tingling or numbness, No Cough - SOB.    Assessment  & Plan :    Principal Problem:   Syncope Active Problems:   Essential hypertension   Cardiac  pacemaker in situ   Dementia   CKD (chronic kidney disease), stage III   AF (paroxysmal atrial fibrillation) (HCC)   Acute encephalopathy in setting  of dementia -On my examination she is alert, oriented to self and place but not to time or situation. This appears to be baseline per family  -PT OT input appreciated Per case management she will  qualify for SNF/ rehab, we will try to place tomorrow  Syncope -Positive orthostatic hypotension in the emergency department and is also taking blood pressures at home  -Echocardiogram => EF 65^,  AS is moderate, Mild AR, mild MR -cardiology consult to evaluate pacer per family request  Atrial fibrillation -CHADS2Vasc 4 -Continue Eliquis -Continue carvedilol -Telemetry  Essential hypertension -Continue carvedilol -Hold Lasix, hydralazine, given orthostasis Check orthostatic bp today   Hypokalemia -Replace and trend  Chronic kidney disease stage III -Stable  DVT prophylaxis: Eliquis Code Status: Full Family Communication: Spoke with daughter in law over the phone today, and son,  They request cardiology consultation EP for evaluation of pacer to make sure no arrythmia involved in syncope.  Disposition Plan: pending improvement    Consultants:   None  Procedures:   None  Antimicrobials:  None  .    Lab Results  Component Value Date   PLT 299 07/21/2016      Anti-infectives    None        Objective:   Vitals:   07/21/16 0341 07/21/16 0347 07/21/16 0544 07/21/16 0554  BP:   (!) 193/153 (!) 157/139  Pulse: 94     Resp: (!) 32     Temp: 98.5 F (36.9 C)     TempSrc: Oral     SpO2: 98% 98%    Weight:      Height:        Wt Readings from Last 3 Encounters:  07/21/16 72.8 kg (160 lb 7.9 oz)  06/20/16 69.9 kg (154 lb)  09/15/15 60.4 kg (133 lb 3.2 oz)     Intake/Output Summary (Last 24 hours) at 07/21/16 0905 Last data filed at 07/21/16 0831  Gross per 24 hour  Intake              680 ml    Output              800 ml  Net             -120 ml     Physical Exam  Awake Alert, Oriented , No new F.N deficits, Normal affect [place].AT,PERRAL Supple Neck,No JVD, No cervical lymphadenopathy appriciated.  Symmetrical Chest wall movement, Good air movement bilaterally, CTAB RRR,No Gallops,Rubs or new Murmurs, No Parasternal Heave +ve B.Sounds, Abd Soft, No tenderness, No organomegaly appriciated, No rebound - guarding or rigidity. No Cyanosis, Clubbing or edema, No new Rash or bruise      Data Review:    CBC  Recent Labs Lab 07/19/16 1811 07/20/16 0101 07/21/16 0256  WBC 11.2* 9.1 11.9*  HGB 13.5 11.9* 12.6  HCT 40.7 36.1 38.5  PLT 333 288 299  MCV 99.0 98.4 100.0  MCH 32.8 32.4 32.7  MCHC 33.2 33.0 32.7  RDW 13.8 13.8 14.4  LYMPHSABS 1.9  --   --   MONOABS 0.8  --   --   EOSABS 0.0  --   --   BASOSABS 0.0  --   --     Chemistries   Recent Labs Lab 07/19/16 1811 07/19/16 2209 07/20/16 0101 07/21/16 0256  NA 140  --  140 139  K 3.7  --  3.3* 4.3  CL 106  --  109 110  CO2 25  --  23 20*  GLUCOSE 98  --  100* 101*  BUN 16  --  14 21*  CREATININE 1.23*  --  1.03* 1.18*  CALCIUM 10.4*  --  9.0 8.3*  MG  --  1.7  --  1.7  AST 21  --   --   --   ALT 18  --   --   --   ALKPHOS 65  --   --   --   BILITOT 0.6  --   --   --    ------------------------------------------------------------------------------------------------------------------ No results for input(s): CHOL, HDL, LDLCALC, TRIG, CHOLHDL, LDLDIRECT in the last 72 hours.  No results found for: HGBA1C ------------------------------------------------------------------------------------------------------------------  Recent Labs  07/19/16 2209  TSH 1.646   ------------------------------------------------------------------------------------------------------------------ No results for input(s): VITAMINB12, FOLATE, FERRITIN, TIBC, IRON, RETICCTPCT in the last 72 hours.  Coagulation profile No  results for input(s): INR, PROTIME in the last 168 hours.  No results for input(s): DDIMER in the last 72 hours.  Cardiac Enzymes  Recent Labs Lab 07/20/16 0106  TROPONINI <0.03   ------------------------------------------------------------------------------------------------------------------ No results found for: BNP  Inpatient Medications  Scheduled Meds: . apixaban  5 mg Oral BID  . carvedilol  18.75 mg Oral BID  . sodium chloride flush  3 mL Intravenous Q12H   Continuous Infusions: PRN Meds:.acetaminophen **OR** acetaminophen, levalbuterol, ondansetron **OR** ondansetron (ZOFRAN) IV  Micro Results Recent Results (from the past 240 hour(s))  MRSA PCR Screening     Status: None   Collection Time: 07/19/16  9:35 PM  Result Value Ref Range Status   MRSA by PCR NEGATIVE NEGATIVE Final    Comment:        The GeneXpert MRSA Assay (FDA approved for NASAL specimens only), is one component of a comprehensive MRSA colonization surveillance program. It is not intended to diagnose MRSA infection nor to guide or monitor treatment for MRSA infections.   Urine culture     Status: Abnormal (Preliminary result)   Collection Time: 07/20/16 11:57 AM  Result Value Ref Range Status   Specimen Description URINE, CLEAN CATCH  Final   Special Requests NONE  Final   Culture >=100,000 COLONIES/mL GRAM NEGATIVE RODS (A)  Final   Report Status PENDING  Incomplete    Radiology Reports Dg Chest 2 View  Result Date: 07/19/2016 CLINICAL DATA:  Possible syncopal episode. Was using the restroom and awakened on the floor. No current pain EXAM: CHEST  2 VIEW COMPARISON:  Chest x-Micheletti of March 08, 2013 FINDINGS: The lungs are adequately inflated. The interstitial markings remain coarse. The cardiac silhouette is enlarged. There is no pleural effusion. There is no pneumothorax. There is calcification in the wall of the aortic arch. The ICD is in stable position. There is partial compression of  the bodies of T9, T10, and T12. IMPRESSION: Chronic cardiomegaly and mild pulmonary vascular congestion. Mild chronic interstitial edema or fibrosis. Thoracic aortic atherosclerosis. 30% compression of the body of T9 which is new since 2013. Chronic partial compressions of T10 and T12. Electronically Signed   By: David  Martinique M.D.   On: 07/19/2016 15:48   Ct Head Wo Contrast  Result Date: 07/19/2016 CLINICAL DATA:  Syncopal episode in restroom.  Confusion. EXAM: CT HEAD WITHOUT CONTRAST TECHNIQUE: Contiguous axial images were obtained from the base of the skull through the vertex without intravenous contrast. COMPARISON:  07/07/2010 FINDINGS: Brain: There is no evidence of acute infarct, intracranial hemorrhage, mass, midline shift, or extra-axial fluid collection. Mild generalized cerebral atrophy is  within normal limits for age. Cerebral white matter hypodensities have mildly progressed from 2012 and are nonspecific but compatible with chronic small vessel ischemic disease, relatively mild for age. Vascular: Calcified atherosclerosis at the skullbase. No hyperdense vessel. 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 No fracture. Sinuses/Orbits: Visualized paranasal sinuses and mastoid air cells are clear. Bilateral cataract extraction. Other: None. IMPRESSION: 1. No evidence of acute intracranial abnormality. 2. Mild chronic small vessel ischemic disease. Electronically Signed   By: Logan Bores M.D.   On: 07/19/2016 15:32    Time Spent in minutes  30   Jani Gravel M.D on 07/21/2016 at 9:05 AM  Between 7am to 7pm - Pager - [phone]  After 7pm go to [email or web address] - password [facility]  [facility] -  Office  [phone]

## 2016-07-21 NOTE — Progress Notes (Signed)
BP still elevated. MD notified. Patient up most of the night and anxious.

## 2016-07-21 NOTE — Progress Notes (Signed)
EKG done. HR 106.

## 2016-07-21 NOTE — Progress Notes (Signed)
CM received call from son Heather Jarvis [phone] who requests a SNF in [place].  CM relayed this message to [place], [phone].  Cm has requested MD to place PT/OT consult for SNF placement. No other CM needs were communicated.

## 2016-07-22 DIAGNOSIS — G309 Alzheimer's disease, unspecified: Secondary | ICD-10-CM | POA: Diagnosis not present

## 2016-07-22 DIAGNOSIS — I480 Paroxysmal atrial fibrillation: Secondary | ICD-10-CM | POA: Diagnosis not present

## 2016-07-22 DIAGNOSIS — R69 Illness, unspecified: Secondary | ICD-10-CM | POA: Diagnosis not present

## 2016-07-22 DIAGNOSIS — Z950 Presence of cardiac pacemaker: Secondary | ICD-10-CM | POA: Diagnosis not present

## 2016-07-22 DIAGNOSIS — I951 Orthostatic hypotension: Secondary | ICD-10-CM | POA: Diagnosis not present

## 2016-07-22 DIAGNOSIS — R55 Syncope and collapse: Secondary | ICD-10-CM | POA: Diagnosis not present

## 2016-07-22 LAB — COMPREHENSIVE METABOLIC PANEL
ALK PHOS: 61 U/L (ref 38–126)
ALT: 16 U/L (ref 14–54)
ANION GAP: 9 (ref 5–15)
AST: 20 U/L (ref 15–41)
Albumin: 2.9 g/dL — ABNORMAL LOW (ref 3.5–5.0)
BILIRUBIN TOTAL: 0.7 mg/dL (ref 0.3–1.2)
BUN: 15 mg/dL (ref 6–20)
CHLORIDE: 108 mmol/L (ref 101–111)
CO2: 21 mmol/L — ABNORMAL LOW (ref 22–32)
Calcium: 8.3 mg/dL — ABNORMAL LOW (ref 8.9–10.3)
Creatinine, Ser: 1.03 mg/dL — ABNORMAL HIGH (ref 0.44–1.00)
GFR, EST AFRICAN AMERICAN: 56 mL/min — AB (ref >60)
GFR, EST NON AFRICAN AMERICAN: 48 mL/min — AB (ref >60)
Glucose, Bld: 100 mg/dL — ABNORMAL HIGH (ref 65–99)
POTASSIUM: 3.8 mmol/L (ref 3.5–5.1)
Sodium: 138 mmol/L (ref 135–145)
TOTAL PROTEIN: 6 g/dL — AB (ref 6.5–8.1)

## 2016-07-22 LAB — CBC
HEMATOCRIT: 37.4 % (ref 36.0–46.0)
HEMOGLOBIN: 12.7 g/dL (ref 12.0–15.0)
MCH: 33.9 pg (ref 26.0–34.0)
MCHC: 34 g/dL (ref 30.0–36.0)
MCV: 99.7 fL (ref 78.0–100.0)
Platelets: 266 10*3/uL (ref 150–400)
RBC: 3.75 MIL/uL — AB (ref 3.87–5.11)
RDW: 14.5 % (ref 11.5–15.5)
WBC: 9.6 10*3/uL (ref 4.0–10.5)

## 2016-07-22 LAB — GLUCOSE, CAPILLARY: GLUCOSE-CAPILLARY: 97 mg/dL (ref 65–99)

## 2016-07-22 MED ORDER — CEPHALEXIN 500 MG PO CAPS: 500.0000 mg | ORAL_CAPSULE | Freq: Two times a day (BID) | ORAL | 0 refills | Status: DC

## 2016-07-22 MED ORDER — FUROSEMIDE 20 MG PO TABS: 20.0000 mg | ORAL_TABLET | Freq: Every day | ORAL | Status: DC

## 2016-07-22 MED ORDER — FUROSEMIDE 20 MG PO TABS
10.0000 mg | ORAL_TABLET | Freq: Every day | ORAL | Status: DC
Start: 2016-07-22 — End: 2016-07-22

## 2016-07-22 MED ORDER — LEVALBUTEROL HCL 0.63 MG/3ML IN NEBU: 0.6300 mg | INHALATION_SOLUTION | Freq: Four times a day (QID) | RESPIRATORY_TRACT | 12 refills | Status: DC | PRN

## 2016-07-22 MED ORDER — CARVEDILOL 6.25 MG PO TABS: 18.7250 mg | ORAL_TABLET | Freq: Two times a day (BID) | ORAL | 0 refills | Status: DC

## 2016-07-22 MED ORDER — HYDRALAZINE HCL 20 MG/ML IJ SOLN
10.0000 mg | Freq: Once | INTRAMUSCULAR | Status: AC
  Administered 2016-07-22: 10 mg via INTRAVENOUS
  Filled 2016-07-22: qty 1

## 2016-07-22 NOTE — Progress Notes (Signed)
Pt BP is still elevated MD on call notified new order received.

## 2016-07-22 NOTE — Progress Notes (Signed)
Explained written discharge instructions to patients son, verbalized understanding. Prescriptions given for new medications. Leaving unit via wheelchair, no s/sx of distress.

## 2016-07-22 NOTE — Clinical Social Work Note (Signed)
CSW acknowledges request from pt's family for SNF placement. Pt was seen by PT on 5/11, recommendation is for home with 24 hr care/HHC. Pt is ambulating 100 ft.  CSW contacted son Everlean Cherry [phone] to advise of PT recommendation. [name] agreeable, and requested updated medical info on pt, CSW referred [name] to [facility] for this information.  CSW is signing off, no other needs to be addressed at this time.  Alaura Schippers B. Joline Maxcy Clinical Social Work Dept Weekend Social Worker [phone] 10:54 AM

## 2016-07-22 NOTE — Progress Notes (Signed)
RN and MD both confirm son declines any home health services as he is taking pt back to [place] with him. NO other CM needs were communicated.

## 2016-07-22 NOTE — Progress Notes (Signed)
Discharging patient to home with son. Patient son is declining any home health that case management was working on. Notified Mariane Masters, case management via phone message, before this letter completely written Sarah verified received message.

## 2016-07-22 NOTE — Discharge Summary (Addendum)
Heather Jarvis, is a 83 y.o. female  DOB 1928-06-12  MRN [id number].  Admission date:  07/19/2016  Admitting Physician  Edwin Dada, MD  Discharge Date:  07/22/2016   Primary MD  Eulas Post, MD  Recommendations for primary care physician for things to follow:     Syncope Positive orthostatic hypotension in the emergency department Echocardiogram => EF 65^,  AS is moderate, Mild AR, mild MR Cardiology evaluated pacer which showed Afib, but nothing to explain her syncope Syncope likely secondary to orthostasis as well as Uti Knee high compression stocking 52mmHg  Acute encephalopathy in setting of dementia /uti Pt at baseline  UTI (E. Coli) Cont keflex 500mg  po bid x 5 days  Atrial fibrillation CHADS2Vasc 4 Continue Eliquis Continue carvedilol  Essential hypertension Continue carvedilol (note we increased dose) Resuming lasix 20mg  po qday,  Please check orthostatic bp at home. If orthostatic may need to reduce dose  Check bmp in 1 week  Hypokalemia resolved  Chronic kidney disease stage III Stable  Gait instability PT , pt would probably benefit from Rehab, to prevent falls and further compression fractures.   Compression fracture T9, with prior compression fractures Outpatient bone density testing by pcp if none recently. Consider FORTEO Pt is not complaining regarding pain ,  Consider MRI and vertebroplasty if pain an issue  Admission Diagnosis  Syncope and collapse [R55]   Discharge Diagnosis  Syncope and collapse [R55]    Principal Problem:   Syncope Active Problems:   Essential hypertension   Cardiac pacemaker in situ   Dementia   CKD (chronic kidney disease), stage III   AF (paroxysmal atrial fibrillation) (HCC)      Past Medical History:  Diagnosis Date  . Arthritis   . Hyperlipidemia   . Hypertension   . Osteoporosis   . Sick sinus  syndrome [facility])     Past Surgical History:  Procedure Laterality Date  . ABDOMINAL HYSTERECTOMY    . APPENDECTOMY    . PACEMAKER GENERATOR CHANGE  02/04/2012  . PACEMAKER GENERATOR CHANGE N/A 02/04/2012   Procedure: PACEMAKER GENERATOR CHANGE;  Surgeon: Evans Lance, MD;  Location: [facility] CATH LAB;  Service: Cardiovascular;  Laterality: N/A;  . TONSILLECTOMY    . VESICOVAGINAL FISTULA CLOSURE W/ TAH         HPI  from the history and physical done on the day of admission:    83 y.o.femalewith a past medical history significant for dementia, pAF on apixaban, CHB with pacer, and HTNwho presents with suspected syncope and confusion. Apparently, EMS was called to the house after someone at home found the patient on the ground in her bathroom.   I was able to get a hold of daughter-in-law over the phone today for further history. Apparently, patient, son and daughter-in-law live together. Yesterday, patient was going to the bathroom and sitting on the toilet without any complaints. She was on the toilet when she slumped forward. Per her daughter-in-law, she thinks patient had stopped breathing  for an hour but is unsure as she was in a state of panic at the time. EMS was called, administeredammonia stick to under her nose, and patient woke up. No report of hitting her head with syncope, no seizure-like activity        Hospital Course:       Pt had CT brain 5/10 => no acute process.  CXR 5/10=> Chronic cardiomegaly and mild pulmonary vascular congestion, mild chronic interestial edema or fibrosis, 30% compression of T9.  Troponin I x3 negative. ua 5/10 + for UTI  (E. Coli),  Sensitivity pending.  Pt presently on keflex and doing well.  Pt's syncope was thought to be orthostatic, and she had some gentle hydration initially.  uti obviously could have played a role.   Pt has had no further syncope.  She appears to be at her baseline.  Her pacer was interogated 5/12 by cardiology and showed  atrial fibrillation which is old and nothing to explain her syncope. Physical therapy evaluated the patient and recommended SNF for rehab due to her gait instability and deconditioning.  Son requesting discharge to SNF in [place], and then was told that she will not qualify.  Son is content with taking her home, he is going to try to place her in an SNF in [place].  Offerred son Home PT to help with her gait instability but he declined. He declined other home services as well.    Follow UP  Follow-up Information    Eulas Post, MD Follow up in 1 week(s).   Specialty:  Family Medicine Contact information: [address] [address] [phone]            Consults obtained - pacer was interogated by cardiology  Discharge Condition:  stable  Diet and Activity recommendation: See Discharge Instructions below  Discharge Instructions         Discharge Medications     Allergies as of 07/22/2016   No Known Allergies     Medication List    TAKE these medications   calcium-vitamin D 500-200 MG-UNIT tablet Commonly known as:  OSCAL WITH D Take 1 tablet by mouth 2 (two) times daily.   carvedilol 6.25 MG tablet Commonly known as:  COREG Take 3 tablets (18.725 mg total) by mouth 2 (two) times daily. What changed:  medication strength   cephALEXin 500 MG capsule Commonly known as:  KEFLEX Take 1 capsule (500 mg total) by mouth every 12 (twelve) hours.   ELIQUIS 5 MG Tabs tablet Generic drug:  apixaban TAKE 1 TABLET TWICE DAILY   furosemide 20 MG tablet Commonly known as:  LASIX Take 1 tablet (20 mg total) by mouth daily.   hydrALAZINE 25 MG tablet Commonly known as:  APRESOLINE TAKE 1 TABLET BY MOUTH TWICE A DAY   levalbuterol 0.63 MG/3ML nebulizer solution Commonly known as:  XOPENEX Take 3 mLs (0.63 mg total) by nebulization every 6 (six) hours as needed for wheezing or shortness of breath.   multivitamin with minerals Tabs  tablet Take 1 tablet by mouth daily. Centrum silver   Vitamin D 2000 units Caps Take 4,000 Units by mouth 2 (two) times daily.       Major procedures and Radiology Reports - PLEASE review detailed and final reports for all details, in brief -      Dg Chest 2 View  Result Date: 07/19/2016 CLINICAL DATA:  Possible syncopal episode. Was using the restroom and awakened on the floor. No current  pain EXAM: CHEST  2 VIEW COMPARISON:  Chest x-[place]ansett of March 08, 2013 FINDINGS: The lungs are adequately inflated. The interstitial markings remain coarse. The cardiac silhouette is enlarged. There is no pleural effusion. There is no pneumothorax. There is calcification in the wall of the aortic arch. The ICD is in stable position. There is partial compression of the bodies of T9, T10, and T12. IMPRESSION: Chronic cardiomegaly and mild pulmonary vascular congestion. Mild chronic interstitial edema or fibrosis. Thoracic aortic atherosclerosis. 30% compression of the body of T9 which is new since 2013. Chronic partial compressions of T10 and T12. Electronically Signed   By: David  Martinique M.D.   On: 07/19/2016 15:48   Ct Head Wo Contrast  Result Date: 07/19/2016 CLINICAL DATA:  Syncopal episode in restroom.  Confusion. EXAM: CT HEAD WITHOUT CONTRAST TECHNIQUE: Contiguous axial images were obtained from the base of the skull through the vertex without intravenous contrast. COMPARISON:  07/07/2010 FINDINGS: Brain: There is no evidence of acute infarct, intracranial hemorrhage, mass, midline shift, or extra-axial fluid collection. Mild generalized cerebral atrophy is within normal limits for age. Cerebral white matter hypodensities have mildly progressed from 2012 and are nonspecific but compatible with chronic small vessel ischemic disease, relatively mild for age. Vascular: Calcified atherosclerosis at the skullbase. No hyperdense vessel. Skull: Unchanged right lateral skull osteoma measuring approximately 15 x 5  mm. No fracture. Sinuses/Orbits: Visualized paranasal sinuses and mastoid air cells are clear. Bilateral cataract extraction. Other: None. IMPRESSION: 1. No evidence of acute intracranial abnormality. 2. Mild chronic small vessel ischemic disease. Electronically Signed   By: Logan Bores M.D.   On: 07/19/2016 15:32    Micro Results     Recent Results (from the past 240 hour(s))  MRSA PCR Screening     Status: None   Collection Time: 07/19/16  9:35 PM  Result Value Ref Range Status   MRSA by PCR NEGATIVE NEGATIVE Final    Comment:        The GeneXpert MRSA Assay (FDA approved for NASAL specimens only), is one component of a comprehensive MRSA colonization surveillance program. It is not intended to diagnose MRSA infection nor to guide or monitor treatment for MRSA infections.   Urine culture     Status: Abnormal (Preliminary result)   Collection Time: 07/20/16 11:57 AM  Result Value Ref Range Status   Specimen Description URINE, CLEAN CATCH  Final   Special Requests NONE  Final   Culture (A)  Final    >=100,000 COLONIES/mL ESCHERICHIA COLI SUSCEPTIBILITIES TO FOLLOW    Report Status PENDING  Incomplete       Today   Subjective    [place] today is alert, (axox2, person, place but not year).  Apparently this is her baseline.  no headache,no chest or abdominal pain, no sob.  No further syncope.  no new weakness tingling or numbness, feels much better.   Objective   Blood pressure (!) 127/91, pulse (!) 108, temperature 99 F (37.2 C), temperature source Axillary, resp. rate 18, height 5\' 2"  (1.575 m), weight 68.4 kg (150 lb 12.7 oz), SpO2 96 %.   Intake/Output Summary (Last 24 hours) at 07/22/16 1027 Last data filed at 07/22/16 0943  Gross per 24 hour  Intake              360 ml  Output                0 ml  Net  360 ml    Exam Awake Alert, Oriented x 2, No new F.N deficits, Normal affect [place].AT,PERRAL Supple Neck,No JVD, No cervical  lymphadenopathy appriciated.  Symmetrical Chest wall movement, Good air movement bilaterally, CTAB RRR,No Gallops,Rubs or new Murmurs, No Parasternal Heave +ve B.Sounds, Abd Soft, Non tender, No organomegaly appriciated, No rebound -guarding or rigidity. No Cyanosis, Clubbing or edema, No new Rash or bruise   Data Review   CBC w Diff: Lab Results  Component Value Date   WBC 9.6 07/22/2016   HGB 12.7 07/22/2016   HCT 37.4 07/22/2016   PLT 266 07/22/2016   LYMPHOPCT 17 07/19/2016   MONOPCT 7 07/19/2016   EOSPCT 0 07/19/2016   BASOPCT 0 07/19/2016    CMP: Lab Results  Component Value Date   NA 138 07/22/2016   NA 141 06/20/2016   K 3.8 07/22/2016   CL 108 07/22/2016   CO2 21 (L) 07/22/2016   BUN 15 07/22/2016   BUN 18 06/20/2016   CREATININE 1.03 (H) 07/22/2016   PROT 6.0 (L) 07/22/2016   ALBUMIN 2.9 (L) 07/22/2016   ALBUMIN TEST NOT PERFORMED 08/28/2012   BILITOT 0.7 07/22/2016   ALKPHOS 61 07/22/2016   AST 20 07/22/2016   ALT 16 07/22/2016  .   Total Time in preparing paper work, data evaluation and todays exam - 30 minutes  Jani Gravel M.D on 07/22/2016 at 10:27 AM  [facility]   Office  [phone]

## 2016-07-23 LAB — URINE CULTURE: Culture: >=100000 — AB

## 2016-07-24 ENCOUNTER — Telehealth: Payer: Self-pay

## 2016-07-24 NOTE — Telephone Encounter (Signed)
LMTCB

## 2016-07-25 NOTE — Telephone Encounter (Signed)
LMTCB

## 2016-07-26 NOTE — Telephone Encounter (Signed)
LMTCB

## 2016-07-27 NOTE — Telephone Encounter (Signed)
LMTCB

## 2016-07-31 NOTE — Telephone Encounter (Signed)
Have attempted to contact pt numerous times with no return call. Pt has not scheduled hospital follow up at this time.  Dr. Elease Hashimoto - [facility]ier

## 2016-08-30 ENCOUNTER — Other Ambulatory Visit: Payer: Self-pay | Admitting: *Deleted

## 2016-08-30 MED ORDER — APIXABAN 5 MG PO TABS: 5.0000 mg | ORAL_TABLET | Freq: Two times a day (BID) | ORAL | 1 refills | Status: DC

## 2016-08-30 NOTE — Telephone Encounter (Signed)
Pharmacy requested a ninety day supply. 

## 2016-08-30 NOTE — Telephone Encounter (Signed)
Age 83 Wt 69.9 kg  06/20/2016 07/22/2016 SrCr 1.03 07/22/2016 Hgb 12.7 HCT 37.4 Saw Dr Lovena Le 06/20/2016 Refill done for Eliquis 5mg  q 12 hours as requested

## 2016-09-19 ENCOUNTER — Telehealth: Payer: Self-pay | Admitting: Cardiology

## 2016-09-19 ENCOUNTER — Encounter: Payer: Medicare HMO | Admitting: *Deleted

## 2016-09-19 NOTE — Telephone Encounter (Signed)
Confirmed remote transmission w/ pt daughter in law.   

## 2016-09-25 ENCOUNTER — Encounter: Payer: Self-pay | Admitting: Cardiology

## 2016-10-11 ENCOUNTER — Other Ambulatory Visit: Payer: Self-pay | Admitting: Family Medicine

## 2016-10-11 DIAGNOSIS — I10 Essential (primary) hypertension: Secondary | ICD-10-CM

## 2017-02-15 ENCOUNTER — Other Ambulatory Visit: Payer: Self-pay | Admitting: Family Medicine

## 2017-02-15 DIAGNOSIS — I10 Essential (primary) hypertension: Secondary | ICD-10-CM

## 2017-02-18 ENCOUNTER — Other Ambulatory Visit: Payer: Self-pay | Admitting: Family Medicine

## 2017-02-18 DIAGNOSIS — I10 Essential (primary) hypertension: Secondary | ICD-10-CM

## 2017-03-07 ENCOUNTER — Ambulatory Visit (INDEPENDENT_AMBULATORY_CARE_PROVIDER_SITE_OTHER): Payer: Medicare HMO | Admitting: *Deleted

## 2017-03-07 DIAGNOSIS — I495 Sick sinus syndrome: Secondary | ICD-10-CM | POA: Diagnosis not present

## 2017-03-08 ENCOUNTER — Encounter: Payer: Self-pay | Admitting: Cardiology

## 2017-03-08 NOTE — Progress Notes (Signed)
Remote pacemaker transmission.   

## 2017-03-11 ENCOUNTER — Telehealth: Payer: Self-pay | Admitting: Family Medicine

## 2017-03-11 NOTE — Telephone Encounter (Unsigned)
Copied from [place] [id number]. Topic: General - Other >> Mar 11, 2017  5:01 PM Neva Seat wrote: Pt's daughter in law called to say pt came home from trip wheezing and having some difficulty breathing normal.  She wanted to make an appt with Dr. Elease Hashimoto and did not want speak with Nurse Triage.   She will carry pt to ER if she shows any distressing signs.

## 2017-03-12 DIAGNOSIS — G9340 Encephalopathy, unspecified: Secondary | ICD-10-CM | POA: Diagnosis not present

## 2017-03-12 DIAGNOSIS — N390 Urinary tract infection, site not specified: Secondary | ICD-10-CM | POA: Diagnosis not present

## 2017-03-13 ENCOUNTER — Encounter: Payer: Self-pay | Admitting: Family Medicine

## 2017-03-13 ENCOUNTER — Ambulatory Visit (INDEPENDENT_AMBULATORY_CARE_PROVIDER_SITE_OTHER): Payer: Medicare HMO | Admitting: Family Medicine

## 2017-03-13 VITALS — BP 120/84 | HR 92 | Temp 98.1°F

## 2017-03-13 DIAGNOSIS — Z23 Encounter for immunization: Secondary | ICD-10-CM | POA: Diagnosis not present

## 2017-03-13 DIAGNOSIS — R05 Cough: Secondary | ICD-10-CM | POA: Diagnosis not present

## 2017-03-13 DIAGNOSIS — G309 Alzheimer's disease, unspecified: Secondary | ICD-10-CM

## 2017-03-13 DIAGNOSIS — R69 Illness, unspecified: Secondary | ICD-10-CM | POA: Diagnosis not present

## 2017-03-13 DIAGNOSIS — F0281 Dementia in other diseases classified elsewhere with behavioral disturbance: Secondary | ICD-10-CM

## 2017-03-13 DIAGNOSIS — R059 Cough, unspecified: Secondary | ICD-10-CM

## 2017-03-13 DIAGNOSIS — I10 Essential (primary) hypertension: Secondary | ICD-10-CM | POA: Diagnosis not present

## 2017-03-13 MED ORDER — LEVALBUTEROL HCL 0.63 MG/3ML IN NEBU: 0.6300 mg | INHALATION_SOLUTION | Freq: Four times a day (QID) | RESPIRATORY_TRACT | 12 refills | Status: AC | PRN

## 2017-03-13 MED ORDER — CEFUROXIME AXETIL 250 MG PO TABS: 250.0000 mg | ORAL_TABLET | Freq: Two times a day (BID) | ORAL | 0 refills | Status: DC

## 2017-03-13 NOTE — Patient Instructions (Signed)
Follow up for any fever or increased shortness of breath. 

## 2017-03-13 NOTE — Progress Notes (Signed)
Subjective:     Patient ID: Heather Jarvis, female   DOB: 15-Sep-1928, 84 y.o.   MRN: [id number]  HPI Patient has advanced dementia and limited history available from patient. She is accompanied by her son who provides history. She has multiple chronic problems and was admitted here back in May with syncope and collapse. At discharge she went to [place] to stay with his sister and just returned back here to live with her son December 22nd. Her chronic problems include history of sick sinus syndrome, hypertension, atrial fibrillation, dementia, osteoporosis, chronic kidney disease, history of normocytic anemia.  She has Xopenex on her med list and has used home nebulizer in the past. There is no clear diagnosis of COPD or asthma. Her son brings her today because of concern that she's had some increased shortness of breath and wheezing and cough for the past few days. No fever. She has had pneumonia in the past. No hemoptysis. No dyspnea at rest.  No increase in leg edema  Ambulates minimally with walker at home..  Past Medical History:  Diagnosis Date  . Arthritis   . Hyperlipidemia   . Hypertension   . Osteoporosis   . Sick sinus syndrome [facility])    Past Surgical History:  Procedure Laterality Date  . ABDOMINAL HYSTERECTOMY    . APPENDECTOMY    . PACEMAKER GENERATOR CHANGE  02/04/2012  . PACEMAKER GENERATOR CHANGE N/A 02/04/2012   Procedure: PACEMAKER GENERATOR CHANGE;  Surgeon: Evans Lance, MD;  Location: [facility] CATH LAB;  Service: Cardiovascular;  Laterality: N/A;  . TONSILLECTOMY    . VESICOVAGINAL FISTULA CLOSURE W/ TAH      reports that she has quit smoking. Her smoking use included cigarettes. She has a 25.00 pack-year smoking history. She quit smokeless tobacco use about 47 years ago. Her smokeless tobacco use included snuff. She reports that she does not drink alcohol or use drugs. family history includes Diabetes in her mother; Heart disease in her brother and mother; Hypertension in  her mother and sister. No Known Allergies   Review of Systems  Constitutional: Negative for chills and fever.  Respiratory: Positive for cough.   Cardiovascular: Negative for chest pain, palpitations and leg swelling.       Objective:   Physical Exam  Constitutional: She appears well-developed and well-nourished.  HENT:  Right Ear: External ear normal.  Left Ear: External ear normal.  Mouth/Throat: Oropharynx is clear and moist.  Neck: Neck supple.  Cardiovascular: Normal rate.  Pulmonary/Chest: Effort normal and breath sounds normal.  No wheezes noted at this time. No rales. Normal respiratory rate. Pulse oximetry 94%  Lymphadenopathy:    She has no cervical adenopathy.       Assessment:     Patient has advanced dementia -seen with reports of some progressive cough past several days with possible intermittent wheezing.    Plan:     -Son requesting prescriptions for home nebulizer and also wheelchair. These were provided -Refill Xopenex to use every 6 hours as needed -Ceftin 250 mg twice daily for 7 days -Follow-up promptly for any fever or increasing shortness of breath -Consider set up follow-up appointment to reassess her multiple medical problems after acute issue has improved -Patient had still not gotten flu vaccine and since she didn't have any fever this was given today.  Eulas Post MD [facility]

## 2017-03-13 NOTE — Telephone Encounter (Signed)
I spoke with pt and put her on schedule for today to see Dr. Elease Hashimoto. Pt might have either cold or sinus type symptoms, no short of breath right now, just congested.

## 2017-03-15 ENCOUNTER — Ambulatory Visit: Payer: Medicare HMO | Admitting: Family Medicine

## 2017-03-17 DIAGNOSIS — I482 Chronic atrial fibrillation: Secondary | ICD-10-CM | POA: Diagnosis not present

## 2017-03-17 DIAGNOSIS — E860 Dehydration: Secondary | ICD-10-CM | POA: Diagnosis not present

## 2017-03-17 DIAGNOSIS — R531 Weakness: Secondary | ICD-10-CM | POA: Diagnosis not present

## 2017-03-17 DIAGNOSIS — I5022 Chronic systolic (congestive) heart failure: Secondary | ICD-10-CM | POA: Diagnosis not present

## 2017-03-17 DIAGNOSIS — J168 Pneumonia due to other specified infectious organisms: Secondary | ICD-10-CM | POA: Diagnosis not present

## 2017-03-17 DIAGNOSIS — E1165 Type 2 diabetes mellitus with hyperglycemia: Secondary | ICD-10-CM | POA: Diagnosis not present

## 2017-03-17 DIAGNOSIS — R404 Transient alteration of awareness: Secondary | ICD-10-CM | POA: Diagnosis not present

## 2017-03-17 DIAGNOSIS — R41841 Cognitive communication deficit: Secondary | ICD-10-CM | POA: Diagnosis not present

## 2017-03-17 DIAGNOSIS — S99921A Unspecified injury of right foot, initial encounter: Secondary | ICD-10-CM | POA: Diagnosis not present

## 2017-03-17 DIAGNOSIS — Z7401 Bed confinement status: Secondary | ICD-10-CM | POA: Diagnosis not present

## 2017-03-17 DIAGNOSIS — E119 Type 2 diabetes mellitus without complications: Secondary | ICD-10-CM | POA: Diagnosis not present

## 2017-03-17 DIAGNOSIS — Z7901 Long term (current) use of anticoagulants: Secondary | ICD-10-CM | POA: Diagnosis not present

## 2017-03-17 DIAGNOSIS — J189 Pneumonia, unspecified organism: Secondary | ICD-10-CM | POA: Diagnosis not present

## 2017-03-17 DIAGNOSIS — I130 Hypertensive heart and chronic kidney disease with heart failure and stage 1 through stage 4 chronic kidney disease, or unspecified chronic kidney disease: Secondary | ICD-10-CM | POA: Diagnosis not present

## 2017-03-17 DIAGNOSIS — M6281 Muscle weakness (generalized): Secondary | ICD-10-CM | POA: Diagnosis not present

## 2017-03-17 DIAGNOSIS — I509 Heart failure, unspecified: Secondary | ICD-10-CM | POA: Diagnosis not present

## 2017-03-17 DIAGNOSIS — J181 Lobar pneumonia, unspecified organism: Secondary | ICD-10-CM | POA: Diagnosis not present

## 2017-03-17 DIAGNOSIS — E876 Hypokalemia: Secondary | ICD-10-CM | POA: Diagnosis not present

## 2017-03-17 DIAGNOSIS — I5033 Acute on chronic diastolic (congestive) heart failure: Secondary | ICD-10-CM | POA: Diagnosis not present

## 2017-03-17 DIAGNOSIS — N189 Chronic kidney disease, unspecified: Secondary | ICD-10-CM | POA: Diagnosis not present

## 2017-03-17 DIAGNOSIS — R4182 Altered mental status, unspecified: Secondary | ICD-10-CM | POA: Diagnosis not present

## 2017-03-17 DIAGNOSIS — R279 Unspecified lack of coordination: Secondary | ICD-10-CM | POA: Diagnosis not present

## 2017-03-17 DIAGNOSIS — N289 Disorder of kidney and ureter, unspecified: Secondary | ICD-10-CM | POA: Diagnosis not present

## 2017-03-17 DIAGNOSIS — I10 Essential (primary) hypertension: Secondary | ICD-10-CM | POA: Diagnosis not present

## 2017-03-17 DIAGNOSIS — R0989 Other specified symptoms and signs involving the circulatory and respiratory systems: Secondary | ICD-10-CM | POA: Diagnosis not present

## 2017-03-17 DIAGNOSIS — R0902 Hypoxemia: Secondary | ICD-10-CM | POA: Diagnosis not present

## 2017-03-17 DIAGNOSIS — R69 Illness, unspecified: Secondary | ICD-10-CM | POA: Diagnosis not present

## 2017-03-23 LAB — CUP PACEART REMOTE DEVICE CHECK
Battery Remaining Longevity: 75 mo
Battery Remaining Percentage: 73 %
Brady Statistic AP VS Percent: 88 %
Brady Statistic AS VS Percent: 7.3 %
Implantable Lead Implant Date: 20080617
Lead Channel Impedance Value: 330 Ohm
Lead Channel Pacing Threshold Amplitude: 2 V
Lead Channel Pacing Threshold Pulse Width: 1 ms
Lead Channel Sensing Intrinsic Amplitude: 1.4 mV
Lead Channel Setting Pacing Pulse Width: 0.7 ms
Lead Channel Setting Sensing Sensitivity: 2 mV
MDC IDC LEAD IMPLANT DT: 20080617
MDC IDC LEAD LOCATION: 753859
MDC IDC LEAD LOCATION: 753860
MDC IDC MSMT BATTERY VOLTAGE: 2.92 V
MDC IDC MSMT LEADCHNL RA PACING THRESHOLD AMPLITUDE: 0.5 V
MDC IDC MSMT LEADCHNL RA PACING THRESHOLD PULSEWIDTH: 0.4 ms
MDC IDC MSMT LEADCHNL RV IMPEDANCE VALUE: 690 Ohm
MDC IDC MSMT LEADCHNL RV SENSING INTR AMPL: 8.2 mV
MDC IDC PG IMPLANT DT: 20131125
MDC IDC SESS DTM: 20181227191029
MDC IDC SET LEADCHNL RA PACING AMPLITUDE: 1.5 V
MDC IDC SET LEADCHNL RV PACING AMPLITUDE: 3 V
MDC IDC STAT BRADY AP VP PERCENT: 4.4 %
MDC IDC STAT BRADY AS VP PERCENT: 1 %
MDC IDC STAT BRADY RA PERCENT PACED: 67 %
MDC IDC STAT BRADY RV PERCENT PACED: 6.5 %
Pulse Gen Model: 2110
Pulse Gen Serial Number: 7368549

## 2017-03-27 DIAGNOSIS — Z7901 Long term (current) use of anticoagulants: Secondary | ICD-10-CM | POA: Diagnosis not present

## 2017-03-27 DIAGNOSIS — R269 Unspecified abnormalities of gait and mobility: Secondary | ICD-10-CM | POA: Diagnosis not present

## 2017-03-27 DIAGNOSIS — J189 Pneumonia, unspecified organism: Secondary | ICD-10-CM | POA: Diagnosis not present

## 2017-03-27 DIAGNOSIS — R41841 Cognitive communication deficit: Secondary | ICD-10-CM | POA: Diagnosis not present

## 2017-03-27 DIAGNOSIS — I5033 Acute on chronic diastolic (congestive) heart failure: Secondary | ICD-10-CM | POA: Diagnosis not present

## 2017-03-27 DIAGNOSIS — J168 Pneumonia due to other specified infectious organisms: Secondary | ICD-10-CM | POA: Diagnosis not present

## 2017-03-27 DIAGNOSIS — E119 Type 2 diabetes mellitus without complications: Secondary | ICD-10-CM | POA: Diagnosis not present

## 2017-03-27 DIAGNOSIS — R69 Illness, unspecified: Secondary | ICD-10-CM | POA: Diagnosis not present

## 2017-03-27 DIAGNOSIS — R279 Unspecified lack of coordination: Secondary | ICD-10-CM | POA: Diagnosis not present

## 2017-03-27 DIAGNOSIS — I10 Essential (primary) hypertension: Secondary | ICD-10-CM | POA: Diagnosis not present

## 2017-03-27 DIAGNOSIS — N189 Chronic kidney disease, unspecified: Secondary | ICD-10-CM | POA: Diagnosis not present

## 2017-03-27 DIAGNOSIS — I5022 Chronic systolic (congestive) heart failure: Secondary | ICD-10-CM | POA: Diagnosis not present

## 2017-03-27 DIAGNOSIS — M6281 Muscle weakness (generalized): Secondary | ICD-10-CM | POA: Diagnosis not present

## 2017-03-27 DIAGNOSIS — I482 Chronic atrial fibrillation: Secondary | ICD-10-CM | POA: Diagnosis not present

## 2017-03-27 DIAGNOSIS — J449 Chronic obstructive pulmonary disease, unspecified: Secondary | ICD-10-CM | POA: Diagnosis not present

## 2017-03-27 DIAGNOSIS — Z7401 Bed confinement status: Secondary | ICD-10-CM | POA: Diagnosis not present

## 2017-04-17 ENCOUNTER — Telehealth: Payer: Self-pay | Admitting: Family Medicine

## 2017-04-17 NOTE — Telephone Encounter (Signed)
Copied from [place] [id number]. Topic: Quick Communication - See Telephone Encounter >> Apr 17, 2017 11:28 AM Vernona Rieger wrote: CRM for notification. See Telephone encounter for:   04/17/17.  Sarah from [facility] called and said that a script was faxed over for her to have a wheelchair, she needs some more info so she can bill her insurance.  She needs possibly OV notes for the need of Wheelchair, her height and weight & a diagnosis for this. Fax [phone]  Attention:: Judson Roch

## 2017-04-18 DIAGNOSIS — I482 Chronic atrial fibrillation: Secondary | ICD-10-CM | POA: Diagnosis not present

## 2017-04-18 DIAGNOSIS — J189 Pneumonia, unspecified organism: Secondary | ICD-10-CM | POA: Diagnosis not present

## 2017-04-18 DIAGNOSIS — R69 Illness, unspecified: Secondary | ICD-10-CM | POA: Diagnosis not present

## 2017-04-18 DIAGNOSIS — I5033 Acute on chronic diastolic (congestive) heart failure: Secondary | ICD-10-CM | POA: Diagnosis not present

## 2017-04-18 DIAGNOSIS — I110 Hypertensive heart disease with heart failure: Secondary | ICD-10-CM | POA: Diagnosis not present

## 2017-04-19 NOTE — Telephone Encounter (Signed)
Last office note faxed and confirmed.

## 2017-04-22 ENCOUNTER — Inpatient Hospital Stay: Payer: Medicare HMO | Admitting: Family Medicine

## 2017-04-22 DIAGNOSIS — I482 Chronic atrial fibrillation: Secondary | ICD-10-CM | POA: Diagnosis not present

## 2017-04-22 DIAGNOSIS — R69 Illness, unspecified: Secondary | ICD-10-CM | POA: Diagnosis not present

## 2017-04-22 DIAGNOSIS — J189 Pneumonia, unspecified organism: Secondary | ICD-10-CM | POA: Diagnosis not present

## 2017-04-22 DIAGNOSIS — I5033 Acute on chronic diastolic (congestive) heart failure: Secondary | ICD-10-CM | POA: Diagnosis not present

## 2017-04-22 DIAGNOSIS — I110 Hypertensive heart disease with heart failure: Secondary | ICD-10-CM | POA: Diagnosis not present

## 2017-04-24 DIAGNOSIS — J189 Pneumonia, unspecified organism: Secondary | ICD-10-CM | POA: Diagnosis not present

## 2017-04-24 DIAGNOSIS — I5033 Acute on chronic diastolic (congestive) heart failure: Secondary | ICD-10-CM | POA: Diagnosis not present

## 2017-04-24 DIAGNOSIS — I482 Chronic atrial fibrillation: Secondary | ICD-10-CM | POA: Diagnosis not present

## 2017-04-24 DIAGNOSIS — I110 Hypertensive heart disease with heart failure: Secondary | ICD-10-CM | POA: Diagnosis not present

## 2017-04-24 DIAGNOSIS — R69 Illness, unspecified: Secondary | ICD-10-CM | POA: Diagnosis not present

## 2017-04-29 DIAGNOSIS — I5033 Acute on chronic diastolic (congestive) heart failure: Secondary | ICD-10-CM | POA: Diagnosis not present

## 2017-04-29 DIAGNOSIS — I482 Chronic atrial fibrillation: Secondary | ICD-10-CM | POA: Diagnosis not present

## 2017-04-29 DIAGNOSIS — R69 Illness, unspecified: Secondary | ICD-10-CM | POA: Diagnosis not present

## 2017-04-29 DIAGNOSIS — J189 Pneumonia, unspecified organism: Secondary | ICD-10-CM | POA: Diagnosis not present

## 2017-04-29 DIAGNOSIS — I110 Hypertensive heart disease with heart failure: Secondary | ICD-10-CM | POA: Diagnosis not present

## 2017-05-01 DIAGNOSIS — I5033 Acute on chronic diastolic (congestive) heart failure: Secondary | ICD-10-CM | POA: Diagnosis not present

## 2017-05-01 DIAGNOSIS — R69 Illness, unspecified: Secondary | ICD-10-CM | POA: Diagnosis not present

## 2017-05-01 DIAGNOSIS — J189 Pneumonia, unspecified organism: Secondary | ICD-10-CM | POA: Diagnosis not present

## 2017-05-01 DIAGNOSIS — I110 Hypertensive heart disease with heart failure: Secondary | ICD-10-CM | POA: Diagnosis not present

## 2017-05-01 DIAGNOSIS — I482 Chronic atrial fibrillation: Secondary | ICD-10-CM | POA: Diagnosis not present

## 2017-05-06 DIAGNOSIS — I5033 Acute on chronic diastolic (congestive) heart failure: Secondary | ICD-10-CM | POA: Diagnosis not present

## 2017-05-06 DIAGNOSIS — J189 Pneumonia, unspecified organism: Secondary | ICD-10-CM | POA: Diagnosis not present

## 2017-05-06 DIAGNOSIS — I110 Hypertensive heart disease with heart failure: Secondary | ICD-10-CM | POA: Diagnosis not present

## 2017-05-06 DIAGNOSIS — I482 Chronic atrial fibrillation: Secondary | ICD-10-CM | POA: Diagnosis not present

## 2017-05-06 DIAGNOSIS — R69 Illness, unspecified: Secondary | ICD-10-CM | POA: Diagnosis not present

## 2017-05-09 DIAGNOSIS — I482 Chronic atrial fibrillation: Secondary | ICD-10-CM | POA: Diagnosis not present

## 2017-05-09 DIAGNOSIS — R69 Illness, unspecified: Secondary | ICD-10-CM | POA: Diagnosis not present

## 2017-05-09 DIAGNOSIS — I110 Hypertensive heart disease with heart failure: Secondary | ICD-10-CM | POA: Diagnosis not present

## 2017-05-09 DIAGNOSIS — J189 Pneumonia, unspecified organism: Secondary | ICD-10-CM | POA: Diagnosis not present

## 2017-05-09 DIAGNOSIS — I5033 Acute on chronic diastolic (congestive) heart failure: Secondary | ICD-10-CM | POA: Diagnosis not present

## 2017-05-13 DIAGNOSIS — I482 Chronic atrial fibrillation: Secondary | ICD-10-CM | POA: Diagnosis not present

## 2017-05-13 DIAGNOSIS — I110 Hypertensive heart disease with heart failure: Secondary | ICD-10-CM | POA: Diagnosis not present

## 2017-05-13 DIAGNOSIS — J189 Pneumonia, unspecified organism: Secondary | ICD-10-CM | POA: Diagnosis not present

## 2017-05-13 DIAGNOSIS — I5033 Acute on chronic diastolic (congestive) heart failure: Secondary | ICD-10-CM | POA: Diagnosis not present

## 2017-05-13 DIAGNOSIS — J449 Chronic obstructive pulmonary disease, unspecified: Secondary | ICD-10-CM | POA: Diagnosis not present

## 2017-05-13 DIAGNOSIS — R69 Illness, unspecified: Secondary | ICD-10-CM | POA: Diagnosis not present

## 2017-05-14 DIAGNOSIS — R269 Unspecified abnormalities of gait and mobility: Secondary | ICD-10-CM | POA: Diagnosis not present

## 2017-05-15 DIAGNOSIS — I482 Chronic atrial fibrillation: Secondary | ICD-10-CM | POA: Diagnosis not present

## 2017-05-15 DIAGNOSIS — I5033 Acute on chronic diastolic (congestive) heart failure: Secondary | ICD-10-CM | POA: Diagnosis not present

## 2017-05-15 DIAGNOSIS — R69 Illness, unspecified: Secondary | ICD-10-CM | POA: Diagnosis not present

## 2017-05-15 DIAGNOSIS — J189 Pneumonia, unspecified organism: Secondary | ICD-10-CM | POA: Diagnosis not present

## 2017-05-15 DIAGNOSIS — I110 Hypertensive heart disease with heart failure: Secondary | ICD-10-CM | POA: Diagnosis not present

## 2017-06-06 ENCOUNTER — Ambulatory Visit (INDEPENDENT_AMBULATORY_CARE_PROVIDER_SITE_OTHER): Payer: Medicare HMO | Admitting: *Deleted

## 2017-06-06 DIAGNOSIS — I495 Sick sinus syndrome: Secondary | ICD-10-CM

## 2017-06-07 NOTE — Progress Notes (Signed)
Remote pacemaker transmission.   

## 2017-06-10 ENCOUNTER — Encounter: Payer: Self-pay | Admitting: Cardiology

## 2017-06-13 DIAGNOSIS — J449 Chronic obstructive pulmonary disease, unspecified: Secondary | ICD-10-CM | POA: Diagnosis not present

## 2017-06-14 DIAGNOSIS — R269 Unspecified abnormalities of gait and mobility: Secondary | ICD-10-CM | POA: Diagnosis not present

## 2017-06-23 LAB — CUP PACEART REMOTE DEVICE CHECK
Battery Remaining Longevity: 75 mo
Battery Remaining Percentage: 73 %
Battery Voltage: 2.92 V
Brady Statistic AP VP Percent: 4.4 %
Brady Statistic AS VP Percent: 1 %
Brady Statistic RA Percent Paced: 62 %
Brady Statistic RV Percent Paced: 6.7 %
Implantable Lead Implant Date: 20080617
Implantable Lead Location: 753860
Implantable Pulse Generator Implant Date: 20131125
Lead Channel Impedance Value: 340 Ohm
Lead Channel Impedance Value: 650 Ohm
Lead Channel Pacing Threshold Pulse Width: 0.4 ms
Lead Channel Sensing Intrinsic Amplitude: 1.4 mV
Lead Channel Setting Pacing Pulse Width: 0.7 ms
Lead Channel Setting Sensing Sensitivity: 2 mV
MDC IDC LEAD IMPLANT DT: 20080617
MDC IDC LEAD LOCATION: 753859
MDC IDC MSMT LEADCHNL RA PACING THRESHOLD AMPLITUDE: 0.5 V
MDC IDC MSMT LEADCHNL RV PACING THRESHOLD AMPLITUDE: 2 V
MDC IDC MSMT LEADCHNL RV PACING THRESHOLD PULSEWIDTH: 1 ms
MDC IDC MSMT LEADCHNL RV SENSING INTR AMPL: 8.5 mV
MDC IDC PG SERIAL: 7368549
MDC IDC SESS DTM: 20190329192322
MDC IDC SET LEADCHNL RA PACING AMPLITUDE: 1.5 V
MDC IDC SET LEADCHNL RV PACING AMPLITUDE: 3 V
MDC IDC STAT BRADY AP VS PERCENT: 88 %
MDC IDC STAT BRADY AS VS PERCENT: 7.3 %

## 2017-07-13 DIAGNOSIS — J449 Chronic obstructive pulmonary disease, unspecified: Secondary | ICD-10-CM | POA: Diagnosis not present

## 2017-07-14 ENCOUNTER — Other Ambulatory Visit: Payer: Self-pay | Admitting: Internal Medicine

## 2017-07-14 DIAGNOSIS — R269 Unspecified abnormalities of gait and mobility: Secondary | ICD-10-CM | POA: Diagnosis not present

## 2017-07-15 NOTE — Telephone Encounter (Signed)
Eliquis 5mg  refill request received; pt is 84 yrs old, Wt-150.79 on 07/22/16, last seen by Dr. Lovena Le on 06/20/16 & overdue, Crea-1.03 on 07/22/16; called pt and her son answered he stated that pt is not overdue until July 2019, advised that the pt was due in July 2019 but no appt was set then he stated the pt, his mom does not get around very well & he has to carry her down the stairs to put in a wheelchair for appts. Stated to son that I was sorry to hear that and if this is new he needs to update PCP regarding this. Advised that we will send in some  but will need to call and get an appt as soon as they can & he stated once his wife got home they would & also, got to see how her health is because she may be able to walk down the stairs without being carried.

## 2017-08-13 DIAGNOSIS — J449 Chronic obstructive pulmonary disease, unspecified: Secondary | ICD-10-CM | POA: Diagnosis not present

## 2017-08-14 DIAGNOSIS — R269 Unspecified abnormalities of gait and mobility: Secondary | ICD-10-CM | POA: Diagnosis not present

## 2017-09-05 ENCOUNTER — Ambulatory Visit (INDEPENDENT_AMBULATORY_CARE_PROVIDER_SITE_OTHER): Payer: Medicare HMO | Admitting: *Deleted

## 2017-09-05 ENCOUNTER — Telehealth: Payer: Self-pay

## 2017-09-05 DIAGNOSIS — I495 Sick sinus syndrome: Secondary | ICD-10-CM

## 2017-09-05 NOTE — Telephone Encounter (Signed)
Spoke with pt and reminded pt of remote transmission that is due today. Pt verbalized understanding.   

## 2017-09-06 NOTE — Progress Notes (Signed)
Remote pacemaker transmission.   

## 2017-09-10 ENCOUNTER — Other Ambulatory Visit: Payer: Self-pay | Admitting: Internal Medicine

## 2017-09-10 MED ORDER — APIXABAN 5 MG PO TABS: 5.0000 mg | ORAL_TABLET | Freq: Two times a day (BID) | ORAL | 0 refills | Status: AC

## 2017-09-10 NOTE — Telephone Encounter (Signed)
Pt requesting a refill on Eliquis sent to [facility] in [place] . Please address

## 2017-09-10 NOTE — Telephone Encounter (Signed)
Eliquis 5mg  refill request received; pt is 84 yrs old, wt-70kg in 2018 when last recorded (in January 2019-wt was declined), Crea-1.03 on 07/22/16-which is over a year old, last seen by Dr. Lovena Le on 06/20/16 and has an appt on 09/30/17, therefore, placed a note on appt to obtain CBC & BMET at visit. Will send in RX until labs are obtained to ensure correct dosing.

## 2017-09-11 ENCOUNTER — Ambulatory Visit (INDEPENDENT_AMBULATORY_CARE_PROVIDER_SITE_OTHER): Payer: Medicare HMO | Admitting: Family Medicine

## 2017-09-11 ENCOUNTER — Encounter: Payer: Self-pay | Admitting: Family Medicine

## 2017-09-11 ENCOUNTER — Other Ambulatory Visit: Payer: Self-pay

## 2017-09-11 ENCOUNTER — Encounter (HOSPITAL_COMMUNITY): Payer: Self-pay | Admitting: Emergency Medicine

## 2017-09-11 ENCOUNTER — Inpatient Hospital Stay (HOSPITAL_COMMUNITY)
Admission: EM | Admit: 2017-09-11 | Discharge: 2017-09-20 | DRG: 291 | Disposition: A | Discharge status: Home / Self Care | Payer: Medicare HMO | Attending: Family Medicine | Admitting: Family Medicine

## 2017-09-11 ENCOUNTER — Emergency Department (HOSPITAL_COMMUNITY): Payer: Medicare HMO

## 2017-09-11 VITALS — BP 124/74 | HR 72 | Temp 98.0°F

## 2017-09-11 DIAGNOSIS — R4182 Altered mental status, unspecified: Secondary | ICD-10-CM

## 2017-09-11 DIAGNOSIS — I5033 Acute on chronic diastolic (congestive) heart failure: Secondary | ICD-10-CM | POA: Diagnosis not present

## 2017-09-11 DIAGNOSIS — N183 Chronic kidney disease, stage 3 unspecified: Secondary | ICD-10-CM | POA: Diagnosis present

## 2017-09-11 DIAGNOSIS — I480 Paroxysmal atrial fibrillation: Secondary | ICD-10-CM | POA: Diagnosis not present

## 2017-09-11 DIAGNOSIS — E870 Hyperosmolality and hypernatremia: Secondary | ICD-10-CM | POA: Diagnosis present

## 2017-09-11 DIAGNOSIS — I10 Essential (primary) hypertension: Secondary | ICD-10-CM

## 2017-09-11 DIAGNOSIS — R269 Unspecified abnormalities of gait and mobility: Secondary | ICD-10-CM | POA: Diagnosis not present

## 2017-09-11 DIAGNOSIS — E785 Hyperlipidemia, unspecified: Secondary | ICD-10-CM | POA: Diagnosis present

## 2017-09-11 DIAGNOSIS — R0602 Shortness of breath: Secondary | ICD-10-CM

## 2017-09-11 DIAGNOSIS — Z7901 Long term (current) use of anticoagulants: Secondary | ICD-10-CM

## 2017-09-11 DIAGNOSIS — J690 Pneumonitis due to inhalation of food and vomit: Secondary | ICD-10-CM | POA: Diagnosis not present

## 2017-09-11 DIAGNOSIS — I700 Atherosclerosis of aorta: Secondary | ICD-10-CM | POA: Diagnosis present

## 2017-09-11 DIAGNOSIS — F0281 Dementia in other diseases classified elsewhere with behavioral disturbance: Secondary | ICD-10-CM | POA: Diagnosis not present

## 2017-09-11 DIAGNOSIS — I350 Nonrheumatic aortic (valve) stenosis: Secondary | ICD-10-CM | POA: Diagnosis present

## 2017-09-11 DIAGNOSIS — I495 Sick sinus syndrome: Secondary | ICD-10-CM | POA: Diagnosis present

## 2017-09-11 DIAGNOSIS — R531 Weakness: Secondary | ICD-10-CM

## 2017-09-11 DIAGNOSIS — I5031 Acute diastolic (congestive) heart failure: Secondary | ICD-10-CM | POA: Diagnosis not present

## 2017-09-11 DIAGNOSIS — G9340 Encephalopathy, unspecified: Secondary | ICD-10-CM | POA: Diagnosis not present

## 2017-09-11 DIAGNOSIS — J9601 Acute respiratory failure with hypoxia: Secondary | ICD-10-CM | POA: Diagnosis not present

## 2017-09-11 DIAGNOSIS — R1310 Dysphagia, unspecified: Secondary | ICD-10-CM | POA: Diagnosis not present

## 2017-09-11 DIAGNOSIS — I130 Hypertensive heart and chronic kidney disease with heart failure and stage 1 through stage 4 chronic kidney disease, or unspecified chronic kidney disease: Secondary | ICD-10-CM | POA: Diagnosis not present

## 2017-09-11 DIAGNOSIS — M810 Age-related osteoporosis without current pathological fracture: Secondary | ICD-10-CM | POA: Diagnosis present

## 2017-09-11 DIAGNOSIS — R3989 Other symptoms and signs involving the genitourinary system: Secondary | ICD-10-CM | POA: Diagnosis not present

## 2017-09-11 DIAGNOSIS — R402441 Other coma, without documented Glasgow coma scale score, or with partial score reported, in the field [EMT or ambulance]: Secondary | ICD-10-CM | POA: Diagnosis not present

## 2017-09-11 DIAGNOSIS — R29898 Other symptoms and signs involving the musculoskeletal system: Secondary | ICD-10-CM | POA: Diagnosis not present

## 2017-09-11 DIAGNOSIS — F0390 Unspecified dementia without behavioral disturbance: Secondary | ICD-10-CM | POA: Diagnosis present

## 2017-09-11 DIAGNOSIS — J90 Pleural effusion, not elsewhere classified: Secondary | ICD-10-CM | POA: Diagnosis not present

## 2017-09-11 DIAGNOSIS — Z79899 Other long term (current) drug therapy: Secondary | ICD-10-CM

## 2017-09-11 DIAGNOSIS — N179 Acute kidney failure, unspecified: Secondary | ICD-10-CM

## 2017-09-11 DIAGNOSIS — R410 Disorientation, unspecified: Secondary | ICD-10-CM | POA: Diagnosis not present

## 2017-09-11 DIAGNOSIS — E876 Hypokalemia: Secondary | ICD-10-CM | POA: Diagnosis not present

## 2017-09-11 DIAGNOSIS — E860 Dehydration: Secondary | ICD-10-CM | POA: Diagnosis present

## 2017-09-11 DIAGNOSIS — F05 Delirium due to known physiological condition: Secondary | ICD-10-CM | POA: Diagnosis present

## 2017-09-11 DIAGNOSIS — J449 Chronic obstructive pulmonary disease, unspecified: Secondary | ICD-10-CM | POA: Diagnosis not present

## 2017-09-11 DIAGNOSIS — Z950 Presence of cardiac pacemaker: Secondary | ICD-10-CM | POA: Diagnosis not present

## 2017-09-11 DIAGNOSIS — N390 Urinary tract infection, site not specified: Secondary | ICD-10-CM | POA: Diagnosis not present

## 2017-09-11 DIAGNOSIS — R918 Other nonspecific abnormal finding of lung field: Secondary | ICD-10-CM | POA: Diagnosis not present

## 2017-09-11 DIAGNOSIS — G309 Alzheimer's disease, unspecified: Secondary | ICD-10-CM

## 2017-09-11 DIAGNOSIS — T17908A Unspecified foreign body in respiratory tract, part unspecified causing other injury, initial encounter: Secondary | ICD-10-CM

## 2017-09-11 DIAGNOSIS — R69 Illness, unspecified: Secondary | ICD-10-CM | POA: Diagnosis not present

## 2017-09-11 DIAGNOSIS — I340 Nonrheumatic mitral (valve) insufficiency: Secondary | ICD-10-CM | POA: Diagnosis not present

## 2017-09-11 DIAGNOSIS — Z85828 Personal history of other malignant neoplasm of skin: Secondary | ICD-10-CM

## 2017-09-11 DIAGNOSIS — Z66 Do not resuscitate: Secondary | ICD-10-CM | POA: Diagnosis present

## 2017-09-11 DIAGNOSIS — E8770 Fluid overload, unspecified: Secondary | ICD-10-CM | POA: Diagnosis not present

## 2017-09-11 DIAGNOSIS — Z8249 Family history of ischemic heart disease and other diseases of the circulatory system: Secondary | ICD-10-CM

## 2017-09-11 DIAGNOSIS — Z87891 Personal history of nicotine dependence: Secondary | ICD-10-CM

## 2017-09-11 LAB — COMPREHENSIVE METABOLIC PANEL
ALT: 15 U/L (ref 0–44)
AST: 50 U/L — AB (ref 15–41)
Albumin: 3.4 g/dL — ABNORMAL LOW (ref 3.5–5.0)
Alkaline Phosphatase: 63 U/L (ref 38–126)
Anion gap: 14 (ref 5–15)
BUN: 28 mg/dL — ABNORMAL HIGH (ref 8–23)
CHLORIDE: 98 mmol/L (ref 98–111)
CO2: 31 mmol/L (ref 22–32)
CREATININE: 1.69 mg/dL — AB (ref 0.44–1.00)
Calcium: 12.6 mg/dL — ABNORMAL HIGH (ref 8.9–10.3)
GFR calc Af Amer: 30 mL/min — ABNORMAL LOW (ref >60)
GFR calc non Af Amer: 26 mL/min — ABNORMAL LOW (ref >60)
Glucose, Bld: 94 mg/dL (ref 70–99)
Potassium: 3.3 mmol/L — ABNORMAL LOW (ref 3.5–5.1)
Sodium: 143 mmol/L (ref 135–145)
Total Bilirubin: 1.4 mg/dL — ABNORMAL HIGH (ref 0.3–1.2)
Total Protein: 6.6 g/dL (ref 6.5–8.1)

## 2017-09-11 LAB — CBC WITH DIFFERENTIAL/PLATELET
ABS IMMATURE GRANULOCYTES: 0.1 10*3/uL (ref 0.0–0.1)
BASOS PCT: 0 %
Basophils Absolute: 0 10*3/uL (ref 0.0–0.1)
Eosinophils Absolute: 0 10*3/uL (ref 0.0–0.7)
Eosinophils Relative: 0 %
HCT: 41.8 % (ref 36.0–46.0)
Hemoglobin: 13.9 g/dL (ref 12.0–15.0)
IMMATURE GRANULOCYTES: 0 %
Lymphocytes Relative: 14 %
Lymphs Abs: 1.9 10*3/uL (ref 0.7–4.0)
MCH: 33.1 pg (ref 26.0–34.0)
MCHC: 33.3 g/dL (ref 30.0–36.0)
MCV: 99.5 fL (ref 78.0–100.0)
Monocytes Absolute: 1.2 10*3/uL — ABNORMAL HIGH (ref 0.1–1.0)
Monocytes Relative: 9 %
NEUTROS ABS: 10.4 10*3/uL — AB (ref 1.7–7.7)
NEUTROS PCT: 77 %
PLATELETS: 251 10*3/uL (ref 150–400)
RBC: 4.2 MIL/uL (ref 3.87–5.11)
RDW: 13.2 % (ref 11.5–15.5)
WBC: 13.6 10*3/uL — AB (ref 4.0–10.5)

## 2017-09-11 LAB — URINALYSIS, ROUTINE W REFLEX MICROSCOPIC
BILIRUBIN URINE: NEGATIVE
Bacteria, UA: NONE SEEN
GLUCOSE, UA: NEGATIVE mg/dL
Hgb urine dipstick: NEGATIVE
KETONES UR: NEGATIVE mg/dL
NITRITE: NEGATIVE
PH: 5 (ref 5.0–8.0)
Protein, ur: NEGATIVE mg/dL
Specific Gravity, Urine: 1.015 (ref 1.005–1.030)

## 2017-09-11 LAB — VITAMIN B12: VITAMIN B 12: 857 pg/mL (ref 180–914)

## 2017-09-11 LAB — I-STAT VENOUS BLOOD GAS, ED
ACID-BASE EXCESS: 14 mmol/L — AB (ref 0.0–2.0)
Bicarbonate: 38.3 mmol/L — ABNORMAL HIGH (ref 20.0–28.0)
O2 SAT: 63 %
TCO2: 40 mmol/L — AB (ref 22–32)
pCO2, Ven: 46.5 mmHg (ref 44.0–60.0)
pH, Ven: 7.523 — ABNORMAL HIGH (ref 7.250–7.430)
pO2, Ven: 30 mmHg — CL (ref 32.0–45.0)

## 2017-09-11 LAB — I-STAT TROPONIN, ED: Troponin i, poc: 0.08 ng/mL (ref 0.00–0.08)

## 2017-09-11 LAB — TSH: TSH: 3.043 u[IU]/mL (ref 0.350–4.500)

## 2017-09-11 MED ORDER — IPRATROPIUM-ALBUTEROL 0.5-2.5 (3) MG/3ML IN SOLN
3.0000 mL | Freq: Four times a day (QID) | RESPIRATORY_TRACT | Status: DC | PRN
  Administered 2017-09-14: 3 mL via RESPIRATORY_TRACT
  Filled 2017-09-11: qty 3

## 2017-09-11 MED ORDER — SODIUM CHLORIDE 0.9% FLUSH: 3.0000 mL | INTRAVENOUS | Status: DC | PRN

## 2017-09-11 MED ORDER — SODIUM CHLORIDE 0.9% FLUSH
3.0000 mL | Freq: Two times a day (BID) | INTRAVENOUS | Status: DC
  Administered 2017-09-11 – 2017-09-18 (×9): 3 mL via INTRAVENOUS

## 2017-09-11 MED ORDER — APIXABAN 5 MG PO TABS
5.0000 mg | ORAL_TABLET | Freq: Two times a day (BID) | ORAL | Status: DC
  Administered 2017-09-12 – 2017-09-13 (×5): 5 mg via ORAL
  Filled 2017-09-11 (×6): qty 1

## 2017-09-11 MED ORDER — IPRATROPIUM BROMIDE 0.02 % IN SOLN
0.5000 mg | Freq: Four times a day (QID) | RESPIRATORY_TRACT | Status: DC
  Administered 2017-09-11: 0.5 mg via RESPIRATORY_TRACT
  Filled 2017-09-11: qty 2.5

## 2017-09-11 MED ORDER — ALBUTEROL SULFATE (2.5 MG/3ML) 0.083% IN NEBU
2.5000 mg | INHALATION_SOLUTION | Freq: Four times a day (QID) | RESPIRATORY_TRACT | Status: DC
  Administered 2017-09-11: 2.5 mg via RESPIRATORY_TRACT
  Filled 2017-09-11: qty 3

## 2017-09-11 MED ORDER — ONDANSETRON HCL 4 MG/2ML IJ SOLN: 4.0000 mg | Freq: Four times a day (QID) | INTRAMUSCULAR | Status: DC | PRN

## 2017-09-11 MED ORDER — SODIUM CHLORIDE 0.9 % IV SOLN
1.0000 g | INTRAVENOUS | Status: DC
  Administered 2017-09-11 – 2017-09-13 (×3): 1 g via INTRAVENOUS
  Filled 2017-09-11 (×3): qty 10

## 2017-09-11 MED ORDER — SODIUM CHLORIDE 0.9 % IV SOLN
250.0000 mL | INTRAVENOUS | Status: DC | PRN
  Administered 2017-09-11: 250 mL via INTRAVENOUS

## 2017-09-11 MED ORDER — ONDANSETRON HCL 4 MG PO TABS: 4.0000 mg | ORAL_TABLET | Freq: Four times a day (QID) | ORAL | Status: DC | PRN

## 2017-09-11 NOTE — ED Notes (Signed)
Admitting at bedside 

## 2017-09-11 NOTE — H&P (Addendum)
[facility]                                                                                    Patient Demographics  Heather Jarvis, is a 84 y.o. female  CSN: [id number]  MRN: [id number]  DOB - 08/06/1928  Admit Date - 09/11/2017  Outpatient Primary MD for the patient is Eulas Post, MD   With History of -  Past Medical History:  Diagnosis Date  . Arthritis   . Hyperlipidemia   . Hypertension   . Osteoporosis   . Sick sinus syndrome [facility])       Past Surgical History:  Procedure Laterality Date  . ABDOMINAL HYSTERECTOMY    . APPENDECTOMY    . PACEMAKER GENERATOR CHANGE  02/04/2012  . PACEMAKER GENERATOR CHANGE N/A 02/04/2012   Procedure: PACEMAKER GENERATOR CHANGE;  Surgeon: Evans Lance, MD;  Location: [facility] CATH LAB;  Service: Cardiovascular;  Laterality: N/A;  . TONSILLECTOMY    . VESICOVAGINAL FISTULA CLOSURE W/ TAH      in for   Chief Complaint  Patient presents with  . Altered Mental Status     HPI  Heather Jarvis  is a 84 y.o. female, 84 year old female with past medical history significant for advanced dementia, hypertension, atrial fibrillation with history of sick sinus syndrome status post pacemaker who was brought by her family today because of increased lethargy and worsening mental status .  The family was in the process of placing her in the nursing home in [place] .  Patient is confused and cannot give any history.  She lives with her son who is not able to take care of her anymore.  In the emergency room , work-up showed leukocytosis with mild congestive heart failure, mild renal insufficiency and UTI.    Review of Systems    Unable to obtain due to patient's condition   Social History Social History   Tobacco Use  . Smoking status: Former Smoker    Packs/day: 1.00    Years: 25.00    Pack years: 25.00    Types: Cigarettes  . Smokeless tobacco: Former Systems developer    Types: Snuff    Quit date: 04/24/1969  Substance Use Topics  .  Alcohol use: No     Family History Family History  Problem Relation Age of Onset  . Hypertension Mother   . Heart disease Mother   . Diabetes Mother   . Hypertension Sister   . Heart disease Brother      Prior to Admission medications   Medication Sig Start Date End Date Taking? Authorizing Provider  apixaban (ELIQUIS) 5 MG TABS tablet Take 1 tablet (5 mg total) by mouth 2 (two) times daily. 09/10/17   Evans Lance, MD  calcium-vitamin D (OSCAL WITH D) 500-200 MG-UNIT per tablet Take 1 tablet by mouth 2 (two) times daily.     [provider]  Cholecalciferol (VITAMIN D) 2000 UNITS CAPS Take 4,000 Units by mouth 2 (two) times daily.     [provider]  levalbuterol Penne Lash) 0.63 MG/3ML nebulizer solution Take 3 mLs (0.63 mg total) by nebulization every 6 (six) hours as needed  for wheezing or shortness of breath. Patient not taking: Reported on 09/11/2017 03/13/17   Eulas Post, MD  Multiple Vitamin (MULITIVITAMIN WITH MINERALS) TABS Take 1 tablet by mouth daily. Centrum silver    [provider]    No Known Allergies  Physical Exam  Vitals  Blood pressure (!) 141/76, pulse 69, temperature 99.2 F (37.3 C), temperature source Oral, resp. rate (!) 25, height 5\' 1"  (1.549 m), weight 63.5 kg (140 lb), SpO2 96 %.   1. General well-developed, well-nourished female  2.  Pleasantly confused.  3.  Neuro: Unable to complete due to mental status.  4. Ears and Eyes appear Normal, Conjunctivae clear, PERRLA. Moist Oral Mucosa.  5. Supple Neck, No JVD, No cervical lymphadenopathy appriciated, No Carotid Bruits.  6. Symmetrical Chest wall movement, decreased breath sounds at the bases.  7.  Irregularly irregular, No Gallops, Rubs or Murmurs, No Parasternal Heave.  8. Positive Bowel Sounds, Abdomen Soft, Non tender, No organomegaly appriciated,No rebound -guarding or rigidity.  9.  No Cyanosis, Normal Skin Turgor, No Skin Rash or Bruise.  10. Good  muscle tone,  joints appear normal , no effusions, Normal ROM.    Data Review  CBC Recent Labs  Lab 09/11/17 1501  WBC 13.6*  HGB 13.9  HCT 41.8  PLT 251  MCV 99.5  MCH 33.1  MCHC 33.3  RDW 13.2  LYMPHSABS 1.9  MONOABS 1.2*  EOSABS 0.0  BASOSABS 0.0   ------------------------------------------------------------------------------------------------------------------  Chemistries  Recent Labs  Lab 09/11/17 1501  NA 143  K 3.3*  CL 98  CO2 31  GLUCOSE 94  BUN 28*  CREATININE 1.69*  CALCIUM 12.6*  AST 50*  ALT 15  ALKPHOS 63  BILITOT 1.4*   ------------------------------------------------------------------------------------------------------------------ estimated creatinine clearance is 20 mL/min (A) (by C-G formula based on SCr of 1.69 mg/dL (H)). ------------------------------------------------------------------------------------------------------------------ No results for input(s): TSH, T4TOTAL, T3FREE, THYROIDAB in the last 72 hours.  Invalid input(s): FREET3   Coagulation profile No results for input(s): INR, PROTIME in the last 168 hours. ------------------------------------------------------------------------------------------------------------------- No results for input(s): DDIMER in the last 72 hours. -------------------------------------------------------------------------------------------------------------------  Cardiac Enzymes No results for input(s): CKMB, TROPONINI, MYOGLOBIN in the last 168 hours.  Invalid input(s): CK ------------------------------------------------------------------------------------------------------------------ Invalid input(s): POCBNP   ---------------------------------------------------------------------------------------------------------------  Urinalysis    Component Value Date/Time   COLORURINE YELLOW 09/11/2017 1709   APPEARANCEUR CLOUDY (A) 09/11/2017 1709   LABSPEC 1.015 09/11/2017 1709   PHURINE 5.0  09/11/2017 1709   GLUCOSEU NEGATIVE 09/11/2017 1709   HGBUR NEGATIVE 09/11/2017 1709   BILIRUBINUR NEGATIVE 09/11/2017 1709   BILIRUBINUR n 03/03/2014 1237   KETONESUR NEGATIVE 09/11/2017 1709   PROTEINUR NEGATIVE 09/11/2017 1709   UROBILINOGEN 0.2 03/03/2014 1237   UROBILINOGEN 0.2 03/08/2013 1506   NITRITE NEGATIVE 09/11/2017 1709   LEUKOCYTESUR TRACE (A) 09/11/2017 1709    ----------------------------------------------------------------------------------------------------------------   Imaging results:   Dg Chest 2 View  Result Date: 09/11/2017 CLINICAL DATA:  Weakness. EXAM: CHEST - 2 VIEW COMPARISON:  Radiograph of March 21, 2017. FINDINGS: Stable cardiomegaly is noted. Atherosclerosis of thoracic aorta is noted. Stable left-sided pacemaker is noted. No pneumothorax is noted. Bilateral pulmonary edema is noted. Small bilateral pleural effusions are noted. Bony thorax is unremarkable. IMPRESSION: Stable cardiomegaly probable mild bilateral pulmonary edema. Aortic Atherosclerosis (ICD10-I70.0). Electronically Signed   By: Marijo Conception, M.D.   On: 09/11/2017 15:33   Ct Head Wo Contrast  Result Date: 09/11/2017 CLINICAL DATA:  Altered mental status EXAM: CT HEAD WITHOUT CONTRAST  TECHNIQUE: Contiguous axial images were obtained from the base of the skull through the vertex without intravenous contrast. COMPARISON:  03/17/2017 FINDINGS: Brain: Mild atrophic changes and chronic white matter ischemic changes are again identified and stable. No findings to suggest acute hemorrhage, acute infarction or space-occupying mass lesion are seen. Vascular: No hyperdense vessel or unexpected calcification. Skull: Normal. Negative for fracture or focal lesion. Sinuses/Orbits: Postsurgical changes are noted bilaterally. Other: None IMPRESSION: Chronic atrophic and ischemic changes stable from the prior exam. No acute abnormality is noted. Electronically Signed   By: Inez Catalina M.D.   On: 09/11/2017  15:17    My personal review of EKG: A. fib at rate of 70 bpm and ST changes noted.  Pacemaker spikes noted on the monitor    Assessment & Plan  1.  Altered mental status/confusion -worsening dementia      CT of head negative 2.  Urinary tract infection 3.  Dehydration/renal insufficiency 4.  Mild congestive heart failure by chest x-[place]. 5.  History of advanced dementia  Plan  Admit to MedSurg IV Rocephin Will hold off hydration with IV fluids at this time due to mild congestive heart failure Check echocardiogram Check TSH/vitamin B12 PT/OT evaluation in a.m. Social worker evaluation in a.m. for placement   DVT Prophylaxis Eliquis  AM Labs Ordered, also please review Full Orders    Code Status DNR  Disposition Plan: Undetermined  Time spent in minutes : 44 minutes  Condition GUARDED  Addendum: Discussed with her son [name], the power of attorney, on the phone.  Patient is DNR.  Witness is Judson Roch , a Marine scientist in [place]   @SIGNATURE @

## 2017-09-11 NOTE — ED Notes (Signed)
Patient transported to CT 

## 2017-09-11 NOTE — ED Triage Notes (Signed)
Pt arrives to ED from [facility] [address] office with complaints of altered mental status from baseline since Saturday. EMS reports pts son stated pt has dementia but since Saturday seems more altered. Pt usually able to ambulate but currently unable to ambulate per son per Ems. Pt placed in position of comfort with bed locked and lowered, call bell in reach.

## 2017-09-11 NOTE — ED Provider Notes (Signed)
[facility] EMERGENCY DEPARTMENT Provider Note   CSN: [id number] Arrival date & time: 09/11/17  1430     History   Chief Complaint Chief Complaint  Patient presents with  . Altered Mental Status    HPI Heather Jarvis is a 84 y.o. female presenting for evaluation of altered mental status and weakness.  Level 5 caveat, patient with dementia.  Per chart review patient presented to the primary care office today for altered mental status and weakness.  For the past 2 days, she has been much more tired, and unable to ambulate due to weakness.  Normally she walks unassisted at baseline.  She has been speaking less than normal per family.  She has not been eating or drinking like normal.  Family denied fever.  No known fall.  Patient is on Eliquis.  History of CKD, A. fib, dementia, pacemaker placement, hypertension.   HPI  Past Medical History:  Diagnosis Date  . Arthritis   . Hyperlipidemia   . Hypertension   . Osteoporosis   . Sick sinus syndrome [facility])     Patient Active Problem List   Diagnosis Date Noted  . UTI (urinary tract infection) 09/11/2017  . Syncope 07/19/2016  . CKD (chronic kidney disease), stage III ([place]) 07/19/2016  . AF (paroxysmal atrial fibrillation) ([place]) 07/19/2016  . Sick sinus syndrome ([place]) 07/19/2016  . Basal cell carcinoma 07/26/2015  . Osteoporosis 03/03/2014  . Influenza without pneumonia 03/12/2013  . Acute pyelonephritis 03/12/2013  . Sepsis due to Escherichia coli ([place]) 03/12/2013  . Normocytic anemia 03/11/2013  . Hyponatremia 03/11/2013  . UTI (lower urinary tract infection) 03/08/2013  . ARF (acute renal failure) ([place]) 04/01/2011  . Dementia 04/01/2011  . Cardiac pacemaker in situ 05/02/2010  . DELUSIONAL DISORDER 08/25/2009  . DEPRESSION/ANXIETY 09/10/2008  . CHRONIC PAIN SYNDROME 07/07/2008  . Essential hypertension 07/07/2008    Past Surgical History:  Procedure Laterality Date  . ABDOMINAL HYSTERECTOMY    .  APPENDECTOMY    . PACEMAKER GENERATOR CHANGE  02/04/2012  . PACEMAKER GENERATOR CHANGE N/A 02/04/2012   Procedure: PACEMAKER GENERATOR CHANGE;  Surgeon: Evans Lance, MD;  Location: [facility] CATH LAB;  Service: Cardiovascular;  Laterality: N/A;  . TONSILLECTOMY    . VESICOVAGINAL FISTULA CLOSURE W/ TAH       OB History   None      Home Medications    Prior to Admission medications   Medication Sig Start Date End Date Taking? Authorizing Provider  apixaban (ELIQUIS) 5 MG TABS tablet Take 1 tablet (5 mg total) by mouth 2 (two) times daily. 09/10/17   Evans Lance, MD  calcium-vitamin D (OSCAL WITH D) 500-200 MG-UNIT per tablet Take 1 tablet by mouth 2 (two) times daily.     [provider]  Cholecalciferol (VITAMIN D) 2000 UNITS CAPS Take 4,000 Units by mouth 2 (two) times daily.     [provider]  levalbuterol Penne Lash) 0.63 MG/3ML nebulizer solution Take 3 mLs (0.63 mg total) by nebulization every 6 (six) hours as needed for wheezing or shortness of breath. Patient not taking: Reported on 09/11/2017 03/13/17   Eulas Post, MD  Multiple Vitamin (MULITIVITAMIN WITH MINERALS) TABS Take 1 tablet by mouth daily. Centrum silver    [provider]    Family History Family History  Problem Relation Age of Onset  . Hypertension Mother   . Heart disease Mother   . Diabetes Mother   . Hypertension Sister   . Heart  disease Brother     Social History Social History   Tobacco Use  . Smoking status: Former Smoker    Packs/day: 1.00    Years: 25.00    Pack years: 25.00    Types: Cigarettes  . Smokeless tobacco: Former Systems developer    Types: Snuff    Quit date: 04/24/1969  Substance Use Topics  . Alcohol use: No  . Drug use: No     Allergies   Patient has no known allergies.   Review of Systems Review of Systems  Unable to perform ROS: Dementia     Physical Exam Updated Vital Signs BP (!) 141/76   Pulse 69   Temp 99.2 F (37.3 C) (Oral)   Resp  (!) 25   Ht 5\' 1"  (1.549 m)   Wt 63.5 kg (140 lb)   SpO2 96%   BMI 26.45 kg/m   Physical Exam  Constitutional: She appears well-developed and well-nourished. No distress.  Chronically ill-appearing elderly female in no acute distress  HENT:  Head: Normocephalic and atraumatic.  Mouth/Throat: Uvula is midline and oropharynx is clear and moist. Mucous membranes are dry.  MM dry.  Eyes: Pupils are equal, round, and reactive to light. Conjunctivae and EOM are normal.  Neck: Normal range of motion. Neck supple.  Cardiovascular: Normal rate, regular rhythm and intact distal pulses.  Pulmonary/Chest: Effort normal and breath sounds normal. No respiratory distress. She has no wheezes.  Abdominal: Soft. Bowel sounds are normal. She exhibits no distension and no mass. There is no tenderness. There is no rebound and no guarding.  Musculoskeletal: Normal range of motion.  Strength equal bilaterally.  Patient unable to lift her legs up due to weakness, similar amount of effort given bilaterally.  Grip strength weak but equal bilaterally.  Radial and pedal pulses intact bilaterally.  Neurological:  Responds with yes or no to direct questions. Tracking movement in room. Follows basic commands. Gross sensation intact.   Skin: Skin is warm and dry. Capillary refill takes less than 2 seconds.  Psychiatric: She has a normal mood and affect.  Nursing note and vitals reviewed.    ED Treatments / Results  Labs (all labs ordered are listed, but only abnormal results are displayed) Labs Reviewed  CBC WITH DIFFERENTIAL/PLATELET - Abnormal; Notable for the following components:      Result Value   WBC 13.6 (*)    Neutro Abs 10.4 (*)    Monocytes Absolute 1.2 (*)    All other components within normal limits  COMPREHENSIVE METABOLIC PANEL - Abnormal; Notable for the following components:   Potassium 3.3 (*)    BUN 28 (*)    Creatinine, Ser 1.69 (*)    Calcium 12.6 (*)    Albumin 3.4 (*)    AST 50  (*)    Total Bilirubin 1.4 (*)    GFR calc non Af Amer 26 (*)    GFR calc Af Amer 30 (*)    All other components within normal limits  URINALYSIS, ROUTINE W REFLEX MICROSCOPIC - Abnormal; Notable for the following components:   APPearance CLOUDY (*)    Leukocytes, UA TRACE (*)    All other components within normal limits  I-STAT VENOUS BLOOD GAS, ED - Abnormal; Notable for the following components:   pH, Ven 7.523 (*)    pO2, Ven 30.0 (*)    Bicarbonate 38.3 (*)    TCO2 40 (*)    Acid-Base Excess 14.0 (*)    All other components within  normal limits  URINE CULTURE  I-STAT TROPONIN, ED    EKG EKG Interpretation  Date/Time:  Wednesday September 11 2017 14:37:56 EDT Ventricular Rate:  78 PR Interval:    QRS Duration: 96 QT Interval:  386 QTC Calculation: 440 R Axis:   -5 Text Interpretation:  Atrial fibrillation Repol abnrm suggests ischemia, diffuse leads Baseline wander in lead(s) V3 multiple PVCs compared to previous Confirmed by Theotis Burrow [phone]) on 09/11/2017 2:51:51 PM   Radiology Dg Chest 2 View  Result Date: 09/11/2017 CLINICAL DATA:  Weakness. EXAM: CHEST - 2 VIEW COMPARISON:  Radiograph of March 21, 2017. FINDINGS: Stable cardiomegaly is noted. Atherosclerosis of thoracic aorta is noted. Stable left-sided pacemaker is noted. No pneumothorax is noted. Bilateral pulmonary edema is noted. Small bilateral pleural effusions are noted. Bony thorax is unremarkable. IMPRESSION: Stable cardiomegaly probable mild bilateral pulmonary edema. Aortic Atherosclerosis (ICD10-I70.0). Electronically Signed   By: Marijo Conception, M.D.   On: 09/11/2017 15:33   Ct Head Wo Contrast  Result Date: 09/11/2017 CLINICAL DATA:  Altered mental status EXAM: CT HEAD WITHOUT CONTRAST TECHNIQUE: Contiguous axial images were obtained from the base of the skull through the vertex without intravenous contrast. COMPARISON:  03/17/2017 FINDINGS: Brain: Mild atrophic changes and chronic white matter ischemic  changes are again identified and stable. No findings to suggest acute hemorrhage, acute infarction or space-occupying mass lesion are seen. Vascular: No hyperdense vessel or unexpected calcification. Skull: Normal. Negative for fracture or focal lesion. Sinuses/Orbits: Postsurgical changes are noted bilaterally. Other: None IMPRESSION: Chronic atrophic and ischemic changes stable from the prior exam. No acute abnormality is noted. Electronically Signed   By: Inez Catalina M.D.   On: 09/11/2017 15:17    Procedures Procedures (including critical care time)  Medications Ordered in ED Medications - No data to display   Initial Impression / Assessment and Plan / ED Course  I have reviewed the triage vital signs and the nursing notes.  Pertinent labs & imaging results that were available during my care of the patient were reviewed by me and considered in my medical decision making (see chart for details).     Patient presenting for evaluation of altered mental status, weakness.  Physical exam shows chronically ill-appearing female who is not responding very much.  Will follow basic commands.  Will obtain labs, urine, chest x-[place], and CT head.  Labs show mild leukocytosis, mild dehydration with creatinine at 1.7, baseline 1.0.  Chest x-Luse shows small bilateral pleural effusions troponin negative.  ABG without hypercarbia.  KG shows A. fib without RVR.  CT head without bleed.  Urine with trace leuks and 6-10 white cells, possible infection.  Culture sent.  Concern for patient's level of weakness, which appears acutely new.  Discussed with attending, Dr. Rex Kras evaluated the patient.  Will call for admission.  Discussed with Dr. Laren Everts from [facility] hospitalist service, patient to be admitted.   Final Clinical Impressions(s) / ED Diagnoses   Final diagnoses:  Dehydration  Weakness  Urinary tract infection without hematuria, site unspecified  Altered mental status, unspecified altered mental status  type    ED Discharge Orders    None       Franchot Heidelberg, PA-C 09/11/17 1948    Little, Wenda Overland, MD 09/15/17 2008

## 2017-09-11 NOTE — ED Notes (Signed)
Rep with [facility] called to advised that patient is showing no change of Pacemaker function;Pt is in chronic A-fib that has been previously noted and Cardiologist is aware; Fax should be received monmentarily-Monique,RN

## 2017-09-11 NOTE — Progress Notes (Signed)
Subjective:     Patient ID: Heather Jarvis, female   DOB: 01-Jul-1928, 84 y.o.   MRN: [id number]  HPI Patient's multiple medical problems including advanced dementia, hypertension, atrial fibrillation, history of sick sinus syndrome, osteoporosis, chronic kidney disease. She is brought in today by her son and daughter-in-law. She has advanced dementia at baseline but they state over the past week she's been having more insomnia and wandering more at night. Then about 2 days ago she became much more lethargic and unable to get out of bed. This is a big change for her. She had been ambulating with walker at baseline. She also had been conversing quite a bit until couple days ago and very little speech since then. She's also had decrease in appetite and fluid intake past couple days.  Family not aware of any fever but they describe what sounds like a shaking chill this morning. Rare cough. Chronic urine incontinence. She wears Depends. They're not aware of any recent fall or head injury  She is maintained on chronic eliquis and apparently was placed on clonidine patch last year. Family states she was admitted for pneumonia [facility] at [facility] last winter We have not seen her since then  She's had general decline over the past few years and they have already been looking at possible nursing home placement down in [place].  Son and daughter-in-law state that they had great difficulty getting her here today. She is unable to help with transfers. They feel unable to take care of her at this point.  Past Medical History:  Diagnosis Date  . Arthritis   . Hyperlipidemia   . Hypertension   . Osteoporosis   . Sick sinus syndrome [facility])    Past Surgical History:  Procedure Laterality Date  . ABDOMINAL HYSTERECTOMY    . APPENDECTOMY    . PACEMAKER GENERATOR CHANGE  02/04/2012  . PACEMAKER GENERATOR CHANGE N/A 02/04/2012   Procedure: PACEMAKER GENERATOR CHANGE;  Surgeon: Evans Lance, MD;   Location[facility] CATH LAB;  Service: Cardiovascular;  Laterality: N/A;  . TONSILLECTOMY    . VESICOVAGINAL FISTULA CLOSURE W/ TAH      reports that she has quit smoking. Her smoking use included cigarettes. She has a 25.00 pack-year smoking history. She quit smokeless tobacco use about 48 years ago. Her smokeless tobacco use included snuff. She reports that she does not drink alcohol or use drugs. family history includes Diabetes in her mother; Heart disease in her brother and mother; Hypertension in her mother and sister. No Known Allergies   Review of Systems Unable to obtain from patient secondary to advanced dementia. She is cooperative but not answering questions consistently  Family does not report any fever but did have what looked like a shaking chill this morning. Recent decreased appetite. Generally weak but they have not note any focal weakness    Objective:   Physical Exam  Constitutional:  Patient sitting in a wheelchair. Arousable but slightly lethargic  HENT:  Oropharynx slightly dry  Neck: Neck supple.  Cardiovascular: Normal rate.  Pulmonary/Chest: No respiratory distress.  Patient did not cooperate with sitting forward and we had difficulty getting a good auscultation of her lungs  Abdominal: Soft. There is no tenderness.  Musculoskeletal: She exhibits no edema.       Assessment:     Patient has advanced dementia with other chronic medical problems as above and is brought in today by son and daughter-in-law with 2-3 day history of sudden change in status  with increased lethargy, generalized weakness, decreased appetite, decreased ambulation. Rule out infection versus acute cerebrovascular event versus other    Plan:     -Family state they do not feel that they can take her back home at this time. Both son and daughter-in-law have some physical issues that made it difficult to lift her and help her with transfers -Family requesting EMS transfer to hospital for  further evaluation/workup secondary to recent sudden change in mental and physical status. -Family looking at nursing home placement soon  Eulas Post MD [facility]

## 2017-09-12 ENCOUNTER — Inpatient Hospital Stay (HOSPITAL_COMMUNITY): Payer: Medicare HMO

## 2017-09-12 DIAGNOSIS — I340 Nonrheumatic mitral (valve) insufficiency: Secondary | ICD-10-CM

## 2017-09-12 DIAGNOSIS — R3989 Other symptoms and signs involving the genitourinary system: Secondary | ICD-10-CM

## 2017-09-12 DIAGNOSIS — E876 Hypokalemia: Secondary | ICD-10-CM

## 2017-09-12 DIAGNOSIS — Z950 Presence of cardiac pacemaker: Secondary | ICD-10-CM

## 2017-09-12 LAB — CBC
HCT: 41.3 % (ref 36.0–46.0)
Hemoglobin: 13.9 g/dL (ref 12.0–15.0)
MCH: 33.7 pg (ref 26.0–34.0)
MCHC: 33.7 g/dL (ref 30.0–36.0)
MCV: 100 fL (ref 78.0–100.0)
PLATELETS: 288 10*3/uL (ref 150–400)
RBC: 4.13 MIL/uL (ref 3.87–5.11)
RDW: 13.3 % (ref 11.5–15.5)
WBC: 11.5 10*3/uL — AB (ref 4.0–10.5)

## 2017-09-12 LAB — BASIC METABOLIC PANEL
Anion gap: 12 (ref 5–15)
BUN: 29 mg/dL — AB (ref 8–23)
CO2: 36 mmol/L — ABNORMAL HIGH (ref 22–32)
CREATININE: 1.68 mg/dL — AB (ref 0.44–1.00)
Calcium: 12.8 mg/dL — ABNORMAL HIGH (ref 8.9–10.3)
Chloride: 98 mmol/L (ref 98–111)
GFR calc Af Amer: 30 mL/min — ABNORMAL LOW (ref >60)
GFR, EST NON AFRICAN AMERICAN: 26 mL/min — AB (ref >60)
GLUCOSE: 70 mg/dL (ref 70–99)
Potassium: <2 mmol/L — CL (ref 3.5–5.1)
Sodium: 146 mmol/L — ABNORMAL HIGH (ref 135–145)

## 2017-09-12 LAB — HEPATIC FUNCTION PANEL
ALK PHOS: 64 U/L (ref 38–126)
ALT: 17 U/L (ref 0–44)
AST: 27 U/L (ref 15–41)
Albumin: 3.5 g/dL (ref 3.5–5.0)
BILIRUBIN INDIRECT: 0.9 mg/dL (ref 0.3–0.9)
BILIRUBIN TOTAL: 1 mg/dL (ref 0.3–1.2)
Bilirubin, Direct: 0.1 mg/dL (ref 0.0–0.2)
TOTAL PROTEIN: 7 g/dL (ref 6.5–8.1)

## 2017-09-12 LAB — ECHOCARDIOGRAM COMPLETE
HEIGHTINCHES: 61 in
Weight: 2240 oz

## 2017-09-12 LAB — URINE CULTURE

## 2017-09-12 LAB — PHOSPHORUS: PHOSPHORUS: 2.7 mg/dL (ref 2.5–4.6)

## 2017-09-12 LAB — MAGNESIUM: Magnesium: 1.5 mg/dL — ABNORMAL LOW (ref 1.7–2.4)

## 2017-09-12 MED ORDER — POTASSIUM CHLORIDE 10 MEQ/100ML IV SOLN
10.0000 meq | INTRAVENOUS | Status: AC
  Administered 2017-09-12 (×4): 10 meq via INTRAVENOUS
  Filled 2017-09-12 (×4): qty 100

## 2017-09-12 MED ORDER — POTASSIUM CHLORIDE 10 MEQ/100ML IV SOLN
10.0000 meq | INTRAVENOUS | Status: AC
  Administered 2017-09-12 (×3): 10 meq via INTRAVENOUS
  Filled 2017-09-12 (×3): qty 100

## 2017-09-12 MED ORDER — POTASSIUM CHLORIDE IN NACL 40-0.9 MEQ/L-% IV SOLN
INTRAVENOUS | Status: DC
  Administered 2017-09-12 – 2017-09-13 (×2): 50 mL/h via INTRAVENOUS
  Filled 2017-09-12 (×3): qty 1000

## 2017-09-12 MED ORDER — HYDRALAZINE HCL 20 MG/ML IJ SOLN
10.0000 mg | Freq: Once | INTRAMUSCULAR | Status: AC
  Administered 2017-09-12: 10 mg via INTRAVENOUS
  Filled 2017-09-12: qty 1

## 2017-09-12 MED ORDER — POTASSIUM CHLORIDE CRYS ER 10 MEQ PO TBCR
40.0000 meq | EXTENDED_RELEASE_TABLET | ORAL | Status: AC
  Administered 2017-09-12: 40 meq via ORAL
  Filled 2017-09-12 (×2): qty 4

## 2017-09-12 MED ORDER — MAGNESIUM SULFATE 2 GM/50ML IV SOLN
2.0000 g | Freq: Once | INTRAVENOUS | Status: AC
  Administered 2017-09-12: 2 g via INTRAVENOUS
  Filled 2017-09-12: qty 50

## 2017-09-12 NOTE — Progress Notes (Signed)
  Echocardiogram 2D Echocardiogram has been performed.  Heather Jarvis 09/12/2017, 10:41 AM

## 2017-09-12 NOTE — Progress Notes (Signed)
[facility] paged critical potassium  less than 2 awaiting orders

## 2017-09-12 NOTE — Progress Notes (Signed)
Attempted to give patient her potassium tablet and she refused it informed the day shift and she will attempt to give medication. Arthor Captain LPN

## 2017-09-12 NOTE — Progress Notes (Signed)
PROGRESS NOTE    Heather Jarvis  JOA:[id number] DOB: 14-Aug-1928 DOA: 09/11/2017 PCP: Eulas Post, MD  Brief Narrative: Heather Jarvis  is a 84 y.o. female, 84 year old female with past medical history significant for advanced dementia, hypertension, atrial fibrillation with history of sick sinus syndrome status post pacemaker who was brought by her family today because of increased lethargy and worsening mental status .  The family was in the process of placing her in the nursing home in [place] .  Patient is confused and cannot give any history.  She lives with her son who is not able to take care of her anymore.  In the emergency room , work-up showed leukocytosis with ? congestive heart failure, mild renal insufficiency and suspected UTI. Found to have severe electrolyte abnormalities.   Assessment & Plan:   Active Problems:   Cardiac pacemaker in situ   Dementia   Hypokalemia   Hypercalcemia   CKD (chronic kidney disease), stage III (HCC)   AF (paroxysmal atrial fibrillation) (HCC)   UTI (urinary tract infection)   Hypomagnesemia   Suspected UTI  Acute Encephalopathy; Multifactorial but in the setting of Worsening/Advanced Dementia, Hypercalcemia, and UTI -Head CT w/o Contrast done and showed Chronic atrophic and ischemic changes stable from the prior exam. No acute abnormality is noted. -Delirium Precautions  Suspected UTI -Patient's urinalysis showed a cloudy appearance with trace leukocytes, negative nitrites, no bacteria seen, 6-10 WBCs, -Urine culture sent and showed multiple species and suggested recollection we will send another urine culture -Continue with empiric antibiotics started on admission with IV Ceftriaxone  Severe Hypokalemia -Patient's K+ went from 3.3 and is now <2.0 -Replete with po KCl 40 meQ q4h x 2 doses and IV KCl 40 meQ along with NS + 40 mEQ KCl at 50 mL/hr -Continue to Monitor and Replete as Necessary -Repeat CMP now and in AM    Hypercalcemia -Patient's Ca2+ worsened and went from 12.6 -> 12.8 -Start gentle IVF resucitation with NaCl + 40 mEQ of KCl at a rate of 50 mL/hr -Check  PTH, PTHrP, 25 Hydroxy Vitamin D, 1,25 Vitamin D -Continue to Monitor and repeat CMP in AM   Hypomagnesemia -Patient's Mag Level this AM was 1.5 -Replete with IV Mag Sulfate 2 grams -Continue to Monitor and Replete as Necessary -Repeat Mag Level in AM   Dehydration and Renal Insuffiencey in the setting of CKD Stage 3 -Patient's BUN/Cr on Admission was 28/1.69 and is now 29/1.68 -Na+ is now 146 and Ca2+ os 12.8 -Start gentle Hydration with NS + 40 mEQ KCl at 50 mL/hr -Continue to Monitor and repeat CMP in AM   Bilateral Pulmonary Edema -Seen on CXR -Appears Clinically Dry -ECHO Done and as below and showed EF of 60-65% -TSH was normal at 3.043  Atrial Fibrillation with Hx of SSS s/p Pacer -C/w Apixaban 5 mg po BID   DVT prophylaxis: Anticoagulated with Apixaban Code Status: DO NOT RESUSCITATE  Family Communication: No family present at bedside  Disposition Plan: SNF if patient's family agreeable  Consultants:   None   Procedures:  ECHOCARDIOGRAM ------------------------------------------------------------------- Study Conclusions  - Left ventricle: The cavity size was normal. Wall thickness was   increased in a pattern of moderate LVH. Systolic function was   normal. The estimated ejection fraction was in the range of 60%   to 65%. The study is not technically sufficient to allow   evaluation of LV diastolic function. - Aortic valve: Mildly to moderately calcified annulus. Trileaflet;   moderately  thickened leaflets. There was mild to moderate   stenosis. There was mild regurgitation. Mean gradient (S): 13 mm   Hg. Valve area (VTI): 1.01 cm^2. Valve area (Vmax): 1.02 cm^2.   Valve area (Vmean): 1.02 cm^2. - Mitral valve: Moderately calcified annulus. Mildly thickened   leaflets . There was mild  regurgitation. - Left atrium: The atrium was severely dilated. - Right atrium: The atrium was mildly dilated. - Atrial septum: No defect or patent foramen ovale was identified. - Technically adequate study.   Antimicrobials:  Anti-infectives (From admission, onward)   Start     Dose/Rate Route Frequency Ordered Stop   09/11/17 2200  cefTRIAXone (ROCEPHIN) 1 g in sodium chloride 0.9 % 100 mL IVPB     1 g 200 mL/hr over 30 Minutes Intravenous Every 24 hours 09/11/17 2107       Subjective: Seen and examined at bedside and she is pleasantly confused but was getting her echo.  She is extremely hard of hearing.  No complaints and denied any pain at this time  Objective: Vitals:   09/12/17 0032 09/12/17 0306 09/12/17 0940 09/12/17 1608  BP: (!) 180/100 134/84 (!) 135/112 (!) 182/74  Pulse:  71 82 74  Resp:  16 18 18   Temp:  98.1 F (36.7 C)  98.6 F (37 C)  TempSrc:  Axillary    SpO2:  97% 100% 100%  Weight:      Height:        Intake/Output Summary (Last 24 hours) at 09/12/2017 1810 Last data filed at 09/12/2017 1659 Gross per 24 hour  Intake 1116.22 ml  Output 650 ml  Net 466.22 ml   Filed Weights   09/11/17 1443  Weight: 63.5 kg (140 lb)   Examination: Physical Exam:  Constitutional: WN/WD elderly confused Caucasian female NAD and appears calm and comfortable Eyes: Lids and conjunctivae normal, sclerae anicteric  ENMT: External Ears, Nose appear normal. Hard of hearing.  Neck: Appears normal, supple, no cervical masses, normal ROM, no appreciable thyromegaly, no JVD Respiratory: Diminished to auscultation bilaterally, no wheezing, rales, rhonchi or crackles. Normal respiratory effort and patient is not tachypenic. No accessory muscle use.  Cardiovascular: RRR, no murmurs / rubs / gallops. S1 and S2 auscultated. No appreciable extremity edema.  Abdomen: Soft, non-tender, non-distended. No masses palpated. No appreciable hepatosplenomegaly. Bowel sounds positive x4.  GU:  Deferred. Musculoskeletal: No clubbing / cyanosis of digits/nails. No joint deformity upper and lower extremities.  Skin: No rashes, lesions, ulcers on a limited skin eval. No induration; Warm and dry.  Neurologic: CN 2-12 grossly intact with the exception that she is hard of hearing. Romberg sign and cerebellar reflexes not assessed.  Psychiatric: Impaired judgment and insight. Awake but not oriented x 3. Normal mood and appropriate affect.   Data Reviewed: I have personally reviewed following labs and imaging studies  CBC: Recent Labs  Lab 09/11/17 1501 09/12/17 0204  WBC 13.6* 11.5*  NEUTROABS 10.4*  --   HGB 13.9 13.9  HCT 41.8 41.3  MCV 99.5 100.0  PLT 251 045   Basic Metabolic Panel: Recent Labs  Lab 09/11/17 1501 09/12/17 0204  NA 143 146*  K 3.3* <2.0*  CL 98 98  CO2 31 36*  GLUCOSE 94 70  BUN 28* 29*  CREATININE 1.69* 1.68*  CALCIUM 12.6* 12.8*  MG  --  1.5*  PHOS  --  2.7   GFR: Estimated Creatinine Clearance: 20.1 mL/min (A) (by C-G formula based on SCr of 1.68 mg/dL (H)).  Liver Function Tests: Recent Labs  Lab 09/11/17 1501 09/12/17 0204  AST 50* 27  ALT 15 17  ALKPHOS 63 64  BILITOT 1.4* 1.0  PROT 6.6 7.0  ALBUMIN 3.4* 3.5   No results for input(s): LIPASE, AMYLASE in the last 168 hours. No results for input(s): AMMONIA in the last 168 hours. Coagulation Profile: No results for input(s): INR, PROTIME in the last 168 hours. Cardiac Enzymes: No results for input(s): CKTOTAL, CKMB, CKMBINDEX, TROPONINI in the last 168 hours. BNP (last 3 results) No results for input(s): PROBNP in the last 8760 hours. HbA1C: No results for input(s): HGBA1C in the last 72 hours. CBG: No results for input(s): GLUCAP in the last 168 hours. Lipid Profile: No results for input(s): CHOL, HDL, LDLCALC, TRIG, CHOLHDL, LDLDIRECT in the last 72 hours. Thyroid Function Tests: Recent Labs    09/11/17 2210  TSH 3.043   Anemia Panel: Recent Labs    09/11/17 2210   VITAMINB12 857   Sepsis Labs: No results for input(s): PROCALCITON, LATICACIDVEN in the last 168 hours.  Recent Results (from the past 240 hour(s))  Urine culture     Status: Abnormal   Collection Time: 09/11/17  5:09 PM  Result Value Ref Range Status   Specimen Description URINE, CLEAN CATCH  Final   Special Requests   Final    NONE Performed at [facility], [address] [place], [place] [address]    Culture MULTIPLE SPECIES PRESENT, SUGGEST RECOLLECTION (A)  Final   Report Status 09/12/2017 FINAL  Final     Radiology Studies: Dg Chest 2 View  Result Date: 09/11/2017 CLINICAL DATA:  Weakness. EXAM: CHEST - 2 VIEW COMPARISON:  Radiograph of March 21, 2017. FINDINGS: Stable cardiomegaly is noted. Atherosclerosis of thoracic aorta is noted. Stable left-sided pacemaker is noted. No pneumothorax is noted. Bilateral pulmonary edema is noted. Small bilateral pleural effusions are noted. Bony thorax is unremarkable. IMPRESSION: Stable cardiomegaly probable mild bilateral pulmonary edema. Aortic Atherosclerosis (ICD10-I70.0). Electronically Signed   By: Marijo Conception, M.D.   On: 09/11/2017 15:33   Ct Head Wo Contrast  Result Date: 09/11/2017 CLINICAL DATA:  Altered mental status EXAM: CT HEAD WITHOUT CONTRAST TECHNIQUE: Contiguous axial images were obtained from the base of the skull through the vertex without intravenous contrast. COMPARISON:  03/17/2017 FINDINGS: Brain: Mild atrophic changes and chronic white matter ischemic changes are again identified and stable. No findings to suggest acute hemorrhage, acute infarction or space-occupying mass lesion are seen. Vascular: No hyperdense vessel or unexpected calcification. Skull: Normal. Negative for fracture or focal lesion. Sinuses/Orbits: Postsurgical changes are noted bilaterally. Other: None IMPRESSION: Chronic atrophic and ischemic changes stable from the prior exam. No acute abnormality is noted. Electronically Signed   By: Inez Catalina M.D.   On: 09/11/2017 15:17   Scheduled Meds: . apixaban  5 mg Oral BID  . sodium chloride flush  3 mL Intravenous Q12H   Continuous Infusions: . sodium chloride 250 mL (09/11/17 2233)  . 0.9 % NaCl with KCl 40 mEq / L 50 mL/hr (09/12/17 1403)  . cefTRIAXone (ROCEPHIN)  IV 1 g (09/11/17 2232)    LOS: 1 day    Kerney Elbe, DO [facility] Pager [phone]  If 7PM-7AM, please contact night-coverage [email or web address] Password [email or web address] 09/12/2017, 6:10 PM

## 2017-09-12 NOTE — Progress Notes (Signed)
Md in call notified of patient BP 180[100 manually. Arthor Captain LPN

## 2017-09-12 NOTE — Evaluation (Signed)
Physical Therapy Evaluation Patient Details Name: Heather Jarvis MRN: [id number] DOB: 09-Dec-1928 Today's Date: 09/12/2017   History of Present Illness  Pt is a 84 y.o. F with significant PMH for advanced dementia, hypertension, atrial fibrillation with history of sick sinus syndrome s/p pacemaker who presents with increased lethargy and worsening mental status.  Clinical Impression  Pt admitted with above diagnosis. Pt currently with functional limitations due to the deficits listed below (see PT Problem List). Patient oriented to self only; follows simple commands inconsistently. No family/caregiver present to determine PLOF but per chart review, family is interested in SNF. On PT evaluation, patient requiring up to 2 person moderate assistance for transferring from bed to chair with hand held assist. Displays decreased safety awareness, balance and functional strength deficits. Highly recommend SNF to maximize functional mobility and decrease caregiver burden. Pt will benefit from skilled PT to increase their independence and safety with mobility.     Follow Up Recommendations SNF;Supervision/Assistance - 24 hour    Equipment Recommendations  Other (comment)(defer to SNF)    Recommendations for Other Services       Precautions / Restrictions Precautions Precautions: Fall Restrictions Weight Bearing Restrictions: No      Mobility  Bed Mobility Overal bed mobility: Needs Assistance Bed Mobility: Supine to Sit     Supine to sit: Max assist     General bed mobility comments: max assistance provided to progress BLE to edge of bed, elevate trunk, and use of bed pad to scoot hips forward to edge of bed  Transfers Overall transfer level: Needs assistance Equipment used: 2 person hand held assist Transfers: Sit to/from [employer] Transfers Sit to Stand: Mod assist;+2 physical assistance;+2 safety/equipment Stand pivot transfers: Min assist;+2 safety/equipment       General  transfer comment: mod assist + 2 to boost up to standing position and controlled lowering into chair. min assist to progress from edge of bed to chair with hand over hand guidance for turning.   Ambulation/Gait                Stairs            Wheelchair Mobility    Modified Rankin (Stroke Patients Only)       Balance Overall balance assessment: Needs assistance Sitting-balance support: Feet supported;Bilateral upper extremity supported Sitting balance-Leahy Scale: Fair     Standing balance support: Bilateral upper extremity supported Standing balance-Leahy Scale: Poor Standing balance comment: reliant on external support                             Pertinent Vitals/Pain Pain Assessment: Faces Faces Pain Scale: No hurt    Home Living Family/patient expects to be discharged to:: Skilled nursing facility Living Arrangements: Children(lives with son)                    Prior Function Level of Independence: Needs assistance         Comments: no family/caregiver present to determine PLOF. per chart review, pt son and daughter in law assist with transfers     Hand Dominance        Extremity/Trunk Assessment   Upper Extremity Assessment Upper Extremity Assessment: Generalized weakness    Lower Extremity Assessment Lower Extremity Assessment: Generalized weakness       Communication   Communication: No difficulties  Cognition Arousal/Alertness: Awake/alert Behavior During Therapy: WFL for tasks assessed/performed Overall Cognitive Status: History of cognitive impairments -  at baseline                                 General Comments: history of advanced dementia. oriented to self only. able to follow simple commands inconsistently with hand over hand guidance       General Comments      Exercises     Assessment/Plan    PT Assessment Patient needs continued PT services  PT Problem List Decreased  strength;Decreased activity tolerance;Decreased balance;Decreased mobility;Decreased cognition;Decreased safety awareness       PT Treatment Interventions Gait training;DME instruction;Functional mobility training;Therapeutic activities;Therapeutic exercise;Balance training;Patient/family education;Cognitive remediation    PT Goals (Current goals can be found in the Care Plan section)  Acute Rehab PT Goals Patient Stated Goal: none stated PT Goal Formulation: Patient unable to participate in goal setting    Frequency Min 2X/week   Barriers to discharge        Co-evaluation               AM-PAC PT "6 Clicks" Daily Activity  Outcome Measure Difficulty turning over in bed (including adjusting bedclothes, sheets and blankets)?: Unable Difficulty moving from lying on back to sitting on the side of the bed? : Unable Difficulty sitting down on and standing up from a chair with arms (e.g., wheelchair, bedside commode, etc,.)?: Unable Help needed moving to and from a bed to chair (including a wheelchair)?: A Little Help needed walking in hospital room?: A Lot Help needed climbing 3-5 steps with a railing? : Total 6 Click Score: 9    End of Session Equipment Utilized During Treatment: Gait belt;Oxygen Activity Tolerance: Patient tolerated treatment well Patient left: in chair;with call bell/phone within reach;with chair alarm set Nurse Communication: Mobility status PT Visit Diagnosis: Unsteadiness on feet (R26.81);Muscle weakness (generalized) (M62.81);Difficulty in walking, not elsewhere classified (R26.2)    Time: 6761-9509 PT Time Calculation (min) (ACUTE ONLY): 19 min   Charges:   PT Evaluation $PT Eval Moderate Complexity: 1 Mod     PT G Codes:        [name], PT, DPT Acute Rehabilitation Services  Pager: [phone]   Willy Eddy 09/12/2017, 9:08 AM

## 2017-09-13 LAB — CBC WITH DIFFERENTIAL/PLATELET
ABS IMMATURE GRANULOCYTES: 0.1 10*3/uL (ref 0.0–0.1)
BASOS PCT: 0 %
Basophils Absolute: 0 10*3/uL (ref 0.0–0.1)
Eosinophils Absolute: 0.2 10*3/uL (ref 0.0–0.7)
Eosinophils Relative: 2 %
HEMATOCRIT: 37.8 % (ref 36.0–46.0)
HEMOGLOBIN: 12.4 g/dL (ref 12.0–15.0)
Immature Granulocytes: 0 %
LYMPHS ABS: 1.8 10*3/uL (ref 0.7–4.0)
Lymphocytes Relative: 16 %
MCH: 33 pg (ref 26.0–34.0)
MCHC: 32.8 g/dL (ref 30.0–36.0)
MCV: 100.5 fL — AB (ref 78.0–100.0)
MONO ABS: 1.2 10*3/uL — AB (ref 0.1–1.0)
MONOS PCT: 11 %
NEUTROS ABS: 8.3 10*3/uL — AB (ref 1.7–7.7)
Neutrophils Relative %: 71 %
PLATELETS: 255 10*3/uL (ref 150–400)
RBC: 3.76 MIL/uL — ABNORMAL LOW (ref 3.87–5.11)
RDW: 13.8 % (ref 11.5–15.5)
WBC: 11.6 10*3/uL — ABNORMAL HIGH (ref 4.0–10.5)

## 2017-09-13 LAB — COMPREHENSIVE METABOLIC PANEL
ALT: 16 U/L (ref 0–44)
AST: 23 U/L (ref 15–41)
Albumin: 3.2 g/dL — ABNORMAL LOW (ref 3.5–5.0)
Alkaline Phosphatase: 62 U/L (ref 38–126)
Anion gap: 10 (ref 5–15)
BILIRUBIN TOTAL: 0.7 mg/dL (ref 0.3–1.2)
BUN: 23 mg/dL (ref 8–23)
CALCIUM: 10.6 mg/dL — AB (ref 8.9–10.3)
CO2: 30 mmol/L (ref 22–32)
CREATININE: 1.33 mg/dL — AB (ref 0.44–1.00)
Chloride: 109 mmol/L (ref 98–111)
GFR, EST AFRICAN AMERICAN: 40 mL/min — AB (ref >60)
GFR, EST NON AFRICAN AMERICAN: 35 mL/min — AB (ref >60)
Glucose, Bld: 106 mg/dL — ABNORMAL HIGH (ref 70–99)
Potassium: 3 mmol/L — ABNORMAL LOW (ref 3.5–5.1)
Sodium: 149 mmol/L — ABNORMAL HIGH (ref 135–145)
TOTAL PROTEIN: 6.2 g/dL — AB (ref 6.5–8.1)

## 2017-09-13 LAB — MAGNESIUM: MAGNESIUM: 1.9 mg/dL (ref 1.7–2.4)

## 2017-09-13 LAB — PHOSPHORUS: PHOSPHORUS: 1.9 mg/dL — AB (ref 2.5–4.6)

## 2017-09-13 MED ORDER — HYDRALAZINE HCL 20 MG/ML IJ SOLN
10.0000 mg | Freq: Four times a day (QID) | INTRAMUSCULAR | Status: DC | PRN
  Administered 2017-09-13 – 2017-09-19 (×5): 10 mg via INTRAVENOUS
  Filled 2017-09-13 (×6): qty 1

## 2017-09-13 MED ORDER — HALOPERIDOL LACTATE 5 MG/ML IJ SOLN
2.5000 mg | Freq: Once | INTRAMUSCULAR | Status: AC
  Administered 2017-09-13: 2.5 mg via INTRAVENOUS
  Filled 2017-09-13: qty 1

## 2017-09-13 MED ORDER — POTASSIUM PHOSPHATES 15 MMOLE/5ML IV SOLN
30.0000 mmol | Freq: Once | INTRAVENOUS | Status: AC
  Administered 2017-09-13: 30 mmol via INTRAVENOUS
  Filled 2017-09-13: qty 10

## 2017-09-13 MED ORDER — POTASSIUM CHLORIDE 20 MEQ/15ML (10%) PO SOLN
40.0000 meq | Freq: Once | ORAL | Status: AC
  Administered 2017-09-13: 40 meq via ORAL
  Filled 2017-09-13: qty 30

## 2017-09-13 MED ORDER — POTASSIUM PHOSPHATES 15 MMOLE/5ML IV SOLN
30.0000 mmol | Freq: Once | INTRAVENOUS | Status: DC
  Filled 2017-09-13: qty 10

## 2017-09-13 MED ORDER — POTASSIUM CL IN DEXTROSE 5% 20 MEQ/L IV SOLN
20.0000 meq | INTRAVENOUS | Status: DC
  Administered 2017-09-14: 20 meq via INTRAVENOUS
  Filled 2017-09-13: qty 1000

## 2017-09-13 MED ORDER — POTASSIUM CHLORIDE 10 MEQ/100ML IV SOLN
10.0000 meq | INTRAVENOUS | Status: DC
Start: 2017-09-13 — End: 2017-09-13
  Administered 2017-09-13: 10 meq via INTRAVENOUS
  Filled 2017-09-13 (×2): qty 100

## 2017-09-13 NOTE — Clinical Social Work Note (Addendum)
Clinical Social Work Assessment  Patient Details  Name: Heather Jarvis MRN: [id number] Date of Birth: May 23, 1928  Date of referral:  09/13/17               Reason for consult:  Facility Placement, Discharge Planning                Permission sought to share information with:  Family Supports Permission granted to share information::  No(Patient oriented to self only)  Name::     Heather Jarvis and Heather Jarvis::     Relationship:: Son and Daughter-in-law  Contact Information:  [phone]  Housing/Transportation Living arrangements for the past 2 months:  Single Family Home Source of Information:  Spouse Patient Interpreter Needed:  None Criminal Activity/Legal Involvement Pertinent to Current Situation/Hospitalization:  No - Comment as needed Significant Relationships:  Spouse, Adult Children Lives with:  Spouse Do you feel safe going back to the place where you live?  No Need for family participation in patient care:  Yes (Comment)  Care giving concerns: Per daughter-in-law, patient will be going to [facility] (LTC facility) in [place], [place] to live as he now needs more care than they can provide. All of patient's family is in this area of [place].  Social Worker assessment / plan:  CSW talked with daughter-in-law Heather Jarvis by phone regarding discharge disposition for patient and above facility information provide. Per Mrs. Crews, patient had lived with she and her husband for 10 years and he now needs more care. Ms. Al Corpus indicated that everything was in place for patient to admit to this facility. CSW asked about rehab at this facility and Mrs Al Corpus informed CSW that they would like patient to have rehab here before going to [place] and this was discussed, along with the  authorization delay with patient's insurance and possibly having to pay privately for patient to admit to a facility prior to insurance auth. and Ms. Crews expressed understanding. The facility search process was  explained and she was emailed SNF list.     Employment status:  Retired Astronomer) PT Recommendations:  Heather Jarvis / Referral to community resources:  Heather Jarvis Facility(SNF list emailed to wife: [email or web address])  Patient/Family's Response to care: No concerns expressed regarding care during hospitalization.  Patient/Family's Understanding of and Emotional Response to Diagnosis, Current Treatment, and Prognosis: Son & daughter-in-law and other family have an understanding of patient's care needs and were proactive in arranging LTC placement for patient in [place], and in requesting ST rehab prior to moving to [place].  Emotional Assessment Appearance:  Appears stated age(Looked in room to determine if family present) Attitude/Demeanor/Rapport:  Unable to Assess(Did not interact with patient) Affect (typically observed):  Unable to Assess(Did not interact with patient) Orientation:  Oriented to Self Alcohol / Substance use:  Tobacco Use, Alcohol Use, Illicit Drugs(Per H&P patient quit smoking and does not drink or use illicit drugs) Psych involvement (Current and /or in the community):  No (Comment)  Discharge Needs  Concerns to be addressed:  Discharge Planning Concerns Readmission within the last 30 days:  No Current discharge risk:  None Barriers to Discharge:  Continued Medical Work up   Heather Jarvis Heather Jarvis, [place] 09/13/2017, 7:17 PM

## 2017-09-13 NOTE — Progress Notes (Signed)
PROGRESS NOTE    [name]  ZOX:[id number] DOB: [birth year] DOA: 09/11/2017 PCP: Eulas Post, MD  Brief Narrative: Heather Jarvis  is a 84 y.o. female, 84 year old female with past medical history significant for advanced dementia, hypertension, atrial fibrillation with history of sick sinus syndrome status post pacemaker who was brought by her family today because of increased lethargy and worsening mental status .  The family was in the process of placing her in the nursing home in [place] .  Patient is confused and cannot give any history.  She lives with her son who is not able to take care of her anymore.  In the emergency room , work-up showed leukocytosis with ? congestive heart failure, mild renal insufficiency and suspected UTI. Found to have severe electrolyte abnormalities that are slowly being corrected.   Assessment & Plan:   Active Problems:   Cardiac pacemaker in situ   Dementia   Hypokalemia   Hypercalcemia   CKD (chronic kidney disease), stage III (HCC)   AF (paroxysmal atrial fibrillation) (HCC)   UTI (urinary tract infection)   Hypomagnesemia   Suspected UTI  Acute Encephalopathy; Multifactorial but in the setting of Worsening/Advanced Dementia, Hypercalcemia, and UTI, improving  -Head CT w/o Contrast done and showed Chronic atrophic and ischemic changes stable from the prior exam. No acute abnormality is noted. -Delirium Precautions -Family states Mentation is improving -UTI being treated and Calcium trending down -Had to be given 2.5 mg Haldol for Severe Agitation last night   Suspected UTI -WBC on Admission was 13.6 and trended down to 11.6 -Patient's urinalysis showed a cloudy appearance with trace leukocytes, negative nitrites, no bacteria seen, 6-10 WBCs, -Urine culture sent and showed multiple species and suggested recollection we will send another urine culture -Continue with empiric antibiotics started on admission with IV Ceftriaxone  Severe  Hypokalemia, improving  -Patient's K+ went from 3.3 to <2.0 and is now 3.0 -Replete with po KCl 40 meQ, IV K Phos 30 mmol, along with NS + 40 mEQ KCl at 50 mL/hr but will change to D5W + 20 mEQ of KCl  -Continue to Monitor and Replete as Necessary -Repeat CMP in AM   Hypercalcemia -Patient's Ca2+ worsened and went from 12.6 -> 12.8 but now improving and is 10.6 -Start gentle IVF resucitation with NaCl + 40 mEQ of KCl at a rate of 50 mL/hr and changed as above  -Check  PTH, PTHrP, 25 Hydroxy Vitamin D, 1,25 Vitamin D and have been ordered and pending  -Continue to Monitor and repeat CMP in AM   Hypomagnesemia -Patient's Mag Level was 1.5 and improved to 1.9 -Replete with IV Mag Sulfate 2 grams yesterday  -Continue to Monitor and Replete as Necessary -Repeat Mag Level in AM   Hypophosphatemia -Patient's Phos Level this AM was 1.9 -Replete with IV KPhos 30 mmol -Continue to Monitor and Replete as Necessary -Repeat CMP In AM   Hypernatremia -Patient's Na+ is worsening and went from 142 -> 149 -Change IVF as above -Repeat CMP in AM   Dehydration and Renal Insuffiencey in the setting of CKD Stage 3, improving -Patient's BUN/Cr on Admission was 28/1.69 and is now 23/1.33 -Na+ is now 149 and Ca2+ is 10.6 -Start gentle Hydration with NS + 40 mEQ KCl at 50 mL/hr and will likely change to D5W + 20 mEQ KCl  -Continue to Monitor and repeat CMP in AM   Bilateral Pulmonary Edema -Seen on CXR -Appears Clinically Dry -ECHO Done and as below and  showed EF of 60-65% -TSH was normal at 3.043 -Repeat CXR in AM   Atrial Fibrillation with Hx of SSS s/p Pacer -C/w Apixaban 5 mg po BID   DVT prophylaxis: Anticoagulated with Apixaban Code Status: DO NOT RESUSCITATE  Family Communication: No family present at bedside  Disposition Plan: SNF if patient's family agreeable  Consultants:   None   Procedures:   ECHOCARDIOGRAM ------------------------------------------------------------------- Study Conclusions  - Left ventricle: The cavity size was normal. Wall thickness was   increased in a pattern of moderate LVH. Systolic function was   normal. The estimated ejection fraction was in the range of 60%   to 65%. The study is not technically sufficient to allow   evaluation of LV diastolic function. - Aortic valve: Mildly to moderately calcified annulus. Trileaflet;   moderately thickened leaflets. There was mild to moderate   stenosis. There was mild regurgitation. Mean gradient (S): 13 mm   Hg. Valve area (VTI): 1.01 cm^2. Valve area (Vmax): 1.02 cm^2.   Valve area (Vmean): 1.02 cm^2. - Mitral valve: Moderately calcified annulus. Mildly thickened   leaflets . There was mild regurgitation. - Left atrium: The atrium was severely dilated. - Right atrium: The atrium was mildly dilated. - Atrial septum: No defect or patent foramen ovale was identified. - Technically adequate study.   Antimicrobials:  Anti-infectives (From admission, onward)   Start     Dose/Rate Route Frequency Ordered Stop   09/11/17 2200  cefTRIAXone (ROCEPHIN) 1 g in sodium chloride 0.9 % 100 mL IVPB     1 g 200 mL/hr over 30 Minutes Intravenous Every 24 hours 09/11/17 2107       Subjective: Seen and examined at bedside and was doing better.  Per nursing Family stated that she improving per overnight report.  Patient had no complaints and denied any chest pain, lightheadedness, and dizziness.  She is pleasantly confused.  Objective: Vitals:   09/13/17 0554 09/13/17 0919 09/13/17 0920 09/13/17 1316  BP: (!) 155/138 (!) 189/162 (!) 180/110 (!) 186/85  Pulse: (!) 104 85    Resp: 18 18    Temp: 99 F (37.2 C) 97.6 F (36.4 C)    TempSrc: Oral Oral    SpO2: 97% 98%    Weight:      Height:        Intake/Output Summary (Last 24 hours) at 09/13/2017 1626 Last data filed at 09/13/2017 1400 Gross per 24 hour  Intake  1949.89 ml  Output 1000 ml  Net 949.89 ml   Filed Weights   09/11/17 1443 09/12/17 2201  Weight: 63.5 kg (140 lb) 64.9 kg (143 lb)   Examination: Physical Exam:  Constitutional: Well-nourished well-developed elderly pleasantly confused and demented female currently no acute distress who appears, comfortable Eyes: Sclera anicteric.  Lids and conjunctive are normal. ENMT: External ears and nose appear normal.  Patient is hard of hearing Neck: Supple with no JVD. Respiratory: Diminished to auscultation bilaterally no appreciable wheezing, rales, rhonchi.  Patient not tachypneic or using accesory muscle breathe Cardiovascular: Regular rate and rhythm.  No appreciable murmurs, rubs, gallops.  No lower extremity edema noted Abdomen: Soft, nontender, nondistended.  Bowel sounds present all 4 quadrants GU: Deferred Musculoskeletal: No clubbing or cyanosis.  No joint deformities of the upper extremities noted.   Skin: Skin is warm and dry with no appreciable rashes or lesions on limited skin evaluation. Neurologic: Cranial nerves II through XII grossly intact with no appreciable focal deficits.  She is extremely hard of hearing.  Romberg sign and cerebellar reflexes not assessed. Spontaneously moves lower extremities. Psychiatric: Impaired Judgment and insight.  Patient is awake but not oriented x3.  She is pleasantly confused and has a normal mood and affect  Data Reviewed: I have personally reviewed following labs and imaging studies  CBC: Recent Labs  Lab 09/11/17 1501 09/12/17 0204 09/13/17 0819  WBC 13.6* 11.5* 11.6*  NEUTROABS 10.4*  --  8.3*  HGB 13.9 13.9 12.4  HCT 41.8 41.3 37.8  MCV 99.5 100.0 100.5*  PLT 251 288 841   Basic Metabolic Panel: Recent Labs  Lab 09/11/17 1501 09/12/17 0204 09/13/17 0819  NA 143 146* 149*  K 3.3* <2.0* 3.0*  CL 98 98 109  CO2 31 36* 30  GLUCOSE 94 70 106*  BUN 28* 29* 23  CREATININE 1.69* 1.68* 1.33*  CALCIUM 12.6* 12.8* 10.6*  MG  --   1.5* 1.9  PHOS  --  2.7 1.9*   GFR: Estimated Creatinine Clearance: 25.7 mL/min (A) (by C-G formula based on SCr of 1.33 mg/dL (H)). Liver Function Tests: Recent Labs  Lab 09/11/17 1501 09/12/17 0204 09/13/17 0819  AST 50* 27 23  ALT 15 17 16   ALKPHOS 63 64 62  BILITOT 1.4* 1.0 0.7  PROT 6.6 7.0 6.2*  ALBUMIN 3.4* 3.5 3.2*   No results for input(s): LIPASE, AMYLASE in the last 168 hours. No results for input(s): AMMONIA in the last 168 hours. Coagulation Profile: No results for input(s): INR, PROTIME in the last 168 hours. Cardiac Enzymes: No results for input(s): CKTOTAL, CKMB, CKMBINDEX, TROPONINI in the last 168 hours. BNP (last 3 results) No results for input(s): PROBNP in the last 8760 hours. HbA1C: No results for input(s): HGBA1C in the last 72 hours. CBG: No results for input(s): GLUCAP in the last 168 hours. Lipid Profile: No results for input(s): CHOL, HDL, LDLCALC, TRIG, CHOLHDL, LDLDIRECT in the last 72 hours. Thyroid Function Tests: Recent Labs    09/11/17 2210  TSH 3.043   Anemia Panel: Recent Labs    09/11/17 2210  VITAMINB12 857   Sepsis Labs: No results for input(s): PROCALCITON, LATICACIDVEN in the last 168 hours.  Recent Results (from the past 240 hour(s))  Urine culture     Status: Abnormal   Collection Time: 09/11/17  5:09 PM  Result Value Ref Range Status   Specimen Description URINE, CLEAN CATCH  Final   Special Requests   Final    NONE Performed at [facility], [address] [place], [place] [address]    Culture MULTIPLE SPECIES PRESENT, SUGGEST RECOLLECTION (A)  Final   Report Status 09/12/2017 FINAL  Final     Radiology Studies: No results found. Scheduled Meds: . apixaban  5 mg Oral BID  . sodium chloride flush  3 mL Intravenous Q12H   Continuous Infusions: . sodium chloride 250 mL (09/11/17 2233)  . 0.9 % NaCl with KCl 40 mEq / L 50 mL/hr (09/13/17 1039)  . cefTRIAXone (ROCEPHIN)  IV Stopped (09/12/17 2134)  .  potassium chloride Stopped (09/13/17 1459)  . potassium PHOSPHATE IVPB (in mmol)      LOS: 2 days    Kerney Elbe, DO [facility] Pager [phone]  If 7PM-7AM, please contact night-coverage [email or web address] Password [email or web address] 09/13/2017, 4:26 PM

## 2017-09-14 ENCOUNTER — Inpatient Hospital Stay (HOSPITAL_COMMUNITY): Payer: Medicare HMO

## 2017-09-14 DIAGNOSIS — I5031 Acute diastolic (congestive) heart failure: Secondary | ICD-10-CM

## 2017-09-14 DIAGNOSIS — T17908A Unspecified foreign body in respiratory tract, part unspecified causing other injury, initial encounter: Secondary | ICD-10-CM

## 2017-09-14 DIAGNOSIS — J9601 Acute respiratory failure with hypoxia: Secondary | ICD-10-CM

## 2017-09-14 LAB — COMPREHENSIVE METABOLIC PANEL WITH GFR
ALT: 23 U/L (ref 0–44)
AST: 30 U/L (ref 15–41)
Albumin: 3 g/dL — ABNORMAL LOW (ref 3.5–5.0)
Alkaline Phosphatase: 66 U/L (ref 38–126)
Anion gap: 11 (ref 5–15)
BUN: 25 mg/dL — ABNORMAL HIGH (ref 8–23)
CO2: 23 mmol/L (ref 22–32)
Calcium: 9.4 mg/dL (ref 8.9–10.3)
Chloride: 111 mmol/L (ref 98–111)
Creatinine, Ser: 1.19 mg/dL — ABNORMAL HIGH (ref 0.44–1.00)
GFR calc Af Amer: 46 mL/min — ABNORMAL LOW
GFR calc non Af Amer: 40 mL/min — ABNORMAL LOW
Glucose, Bld: 130 mg/dL — ABNORMAL HIGH (ref 70–99)
Potassium: 4 mmol/L (ref 3.5–5.1)
Sodium: 145 mmol/L (ref 135–145)
Total Bilirubin: 0.7 mg/dL (ref 0.3–1.2)
Total Protein: 6.3 g/dL — ABNORMAL LOW (ref 6.5–8.1)

## 2017-09-14 LAB — CBC WITH DIFFERENTIAL/PLATELET
Abs Immature Granulocytes: 0.1 10*3/uL (ref 0.0–0.1)
Basophils Absolute: 0.1 10*3/uL (ref 0.0–0.1)
Basophils Relative: 0 %
Eosinophils Absolute: 0.2 10*3/uL (ref 0.0–0.7)
Eosinophils Relative: 1 %
HCT: 39.8 % (ref 36.0–46.0)
Hemoglobin: 13 g/dL (ref 12.0–15.0)
Immature Granulocytes: 1 %
Lymphocytes Relative: 15 %
Lymphs Abs: 2.4 10*3/uL (ref 0.7–4.0)
MCH: 33.3 pg (ref 26.0–34.0)
MCHC: 32.7 g/dL (ref 30.0–36.0)
MCV: 102.1 fL — ABNORMAL HIGH (ref 78.0–100.0)
Monocytes Absolute: 1.2 10*3/uL — ABNORMAL HIGH (ref 0.1–1.0)
Monocytes Relative: 7 %
Neutro Abs: 11.7 10*3/uL — ABNORMAL HIGH (ref 1.7–7.7)
Neutrophils Relative %: 76 %
Platelets: 266 10*3/uL (ref 150–400)
RBC: 3.9 MIL/uL (ref 3.87–5.11)
RDW: 14 % (ref 11.5–15.5)
WBC: 15.6 10*3/uL — ABNORMAL HIGH (ref 4.0–10.5)

## 2017-09-14 LAB — BRAIN NATRIURETIC PEPTIDE: B Natriuretic Peptide: 831.8 pg/mL — ABNORMAL HIGH (ref 0.0–100.0)

## 2017-09-14 LAB — PTH, INTACT AND CALCIUM
CALCIUM TOTAL (PTH): 10.8 mg/dL — AB (ref 8.7–10.3)
PTH: 20 pg/mL (ref 15–65)

## 2017-09-14 LAB — BLOOD GAS, ARTERIAL
Acid-Base Excess: 0.4 mmol/L (ref 0.0–2.0)
Bicarbonate: 23.2 mmol/L (ref 20.0–28.0)
Drawn by: 129711
O2 Content: 2 L/min
O2 Saturation: 94.9 %
PATIENT TEMPERATURE: 98.6
PH ART: 7.512 — AB (ref 7.350–7.450)
pCO2 arterial: 29.1 mmHg — ABNORMAL LOW (ref 32.0–48.0)
pO2, Arterial: 66.4 mmHg — ABNORMAL LOW (ref 83.0–108.0)

## 2017-09-14 LAB — APTT
APTT: 31 s (ref 24–36)
aPTT: 92 seconds — ABNORMAL HIGH (ref 24–36)

## 2017-09-14 LAB — HEPARIN LEVEL (UNFRACTIONATED): Heparin Unfractionated: >2.2 IU/mL — ABNORMAL HIGH (ref 0.30–0.70)

## 2017-09-14 LAB — MAGNESIUM: MAGNESIUM: 1.6 mg/dL — AB (ref 1.7–2.4)

## 2017-09-14 LAB — VITAMIN D 25 HYDROXY (VIT D DEFICIENCY, FRACTURES): Vit D, 25-Hydroxy: 52.6 ng/mL (ref 30.0–100.0)

## 2017-09-14 LAB — PHOSPHORUS: Phosphorus: 4.6 mg/dL (ref 2.5–4.6)

## 2017-09-14 MED ORDER — HEPARIN (PORCINE) IN NACL 100-0.45 UNIT/ML-% IJ SOLN
850.0000 [IU]/h | INTRAMUSCULAR | Status: AC
  Administered 2017-09-14: 900 [IU]/h via INTRAVENOUS
  Administered 2017-09-15: 850 [IU]/h via INTRAVENOUS
  Filled 2017-09-14 (×2): qty 250

## 2017-09-14 MED ORDER — PIPERACILLIN-TAZOBACTAM 3.375 G IVPB
3.3750 g | Freq: Three times a day (TID) | INTRAVENOUS | Status: DC
  Administered 2017-09-14 – 2017-09-18 (×12): 3.375 g via INTRAVENOUS
  Filled 2017-09-14 (×15): qty 50

## 2017-09-14 MED ORDER — HEPARIN BOLUS VIA INFUSION
3000.0000 [IU] | Freq: Once | INTRAVENOUS | Status: AC
  Administered 2017-09-14: 3000 [IU] via INTRAVENOUS
  Filled 2017-09-14: qty 3000

## 2017-09-14 MED ORDER — LEVALBUTEROL HCL 0.63 MG/3ML IN NEBU
0.6300 mg | INHALATION_SOLUTION | Freq: Four times a day (QID) | RESPIRATORY_TRACT | Status: DC
  Administered 2017-09-14 (×3): 0.63 mg via RESPIRATORY_TRACT
  Filled 2017-09-14 (×3): qty 3

## 2017-09-14 MED ORDER — MAGNESIUM SULFATE 2 GM/50ML IV SOLN
2.0000 g | Freq: Once | INTRAVENOUS | Status: AC
  Administered 2017-09-14: 2 g via INTRAVENOUS
  Filled 2017-09-14 (×2): qty 50

## 2017-09-14 MED ORDER — IPRATROPIUM BROMIDE 0.02 % IN SOLN
0.5000 mg | Freq: Four times a day (QID) | RESPIRATORY_TRACT | Status: DC
  Administered 2017-09-14 (×3): 0.5 mg via RESPIRATORY_TRACT
  Filled 2017-09-14 (×3): qty 2.5

## 2017-09-14 MED ORDER — FUROSEMIDE 10 MG/ML IJ SOLN
40.0000 mg | Freq: Once | INTRAMUSCULAR | Status: AC
  Administered 2017-09-14: 40 mg via INTRAVENOUS
  Filled 2017-09-14: qty 4

## 2017-09-14 MED ORDER — METHYLPREDNISOLONE SODIUM SUCC 40 MG IJ SOLR
40.0000 mg | Freq: Two times a day (BID) | INTRAMUSCULAR | Status: DC
  Administered 2017-09-14 (×2): 40 mg via INTRAVENOUS
  Filled 2017-09-14 (×2): qty 1

## 2017-09-14 MED ORDER — LEVALBUTEROL HCL 0.63 MG/3ML IN NEBU
0.6300 mg | INHALATION_SOLUTION | Freq: Three times a day (TID) | RESPIRATORY_TRACT | Status: DC
  Administered 2017-09-15 – 2017-09-16 (×4): 0.63 mg via RESPIRATORY_TRACT
  Filled 2017-09-14 (×6): qty 3

## 2017-09-14 MED ORDER — IPRATROPIUM BROMIDE 0.02 % IN SOLN
0.5000 mg | Freq: Three times a day (TID) | RESPIRATORY_TRACT | Status: DC
  Administered 2017-09-15 – 2017-09-16 (×4): 0.5 mg via RESPIRATORY_TRACT
  Filled 2017-09-14 (×6): qty 2.5

## 2017-09-14 MED ORDER — METOPROLOL TARTRATE 5 MG/5ML IV SOLN
5.0000 mg | Freq: Three times a day (TID) | INTRAVENOUS | Status: DC | PRN
  Administered 2017-09-14 – 2017-09-18 (×5): 5 mg via INTRAVENOUS
  Filled 2017-09-14 (×5): qty 5

## 2017-09-14 MED ORDER — METOPROLOL TARTRATE 5 MG/5ML IV SOLN
5.0000 mg | Freq: Once | INTRAVENOUS | Status: AC
  Administered 2017-09-14: 5 mg via INTRAVENOUS
  Filled 2017-09-14: qty 5

## 2017-09-14 NOTE — NC FL2 (Signed)
[place] LEVEL OF CARE SCREENING TOOL     IDENTIFICATION  Patient Name: Heather Jarvis Birthdate: 1928-10-23 Sex: female Admission Date (Current Location): 09/11/2017  [facility] and [place] Number:  Herbalist and Address:  [facility], [place] [address], [place], [place] [address]      Provider Number: [id number]  Attending Physician Name and Address:  Kerney Elbe, DO  Relative Name and Phone Number:  [name] and Gasper Lloyd - son and daughter-in-law; [phone]    Current Level of Care: Hospital Recommended Level of Care: [place] Prior Approval Number:    Date Approved/Denied:   PASRR Number: [id number]  Discharge Plan: SNF    Current Diagnoses: Patient Active Problem List   Diagnosis Date Noted  . Hypomagnesemia 09/12/2017  . Suspected UTI 09/12/2017  . UTI (urinary tract infection) 09/11/2017  . Syncope 07/19/2016  . CKD (chronic kidney disease), stage III ([place]) 07/19/2016  . AF (paroxysmal atrial fibrillation) ([place]) 07/19/2016  . Sick sinus syndrome ([place]) 07/19/2016  . Basal cell carcinoma 07/26/2015  . Osteoporosis 03/03/2014  . Influenza without pneumonia 03/12/2013  . Acute pyelonephritis 03/12/2013  . Sepsis due to Escherichia coli ([place]) 03/12/2013  . Normocytic anemia 03/11/2013  . Hyponatremia 03/11/2013  . UTI (lower urinary tract infection) 03/08/2013  . Hypercalcemia 08/20/2012  . Hypokalemia 04/06/2011  . ARF (acute renal failure) ([place]) 04/01/2011  . Dementia 04/01/2011  . Cardiac pacemaker in situ 05/02/2010  . DELUSIONAL DISORDER 08/25/2009  . DEPRESSION/ANXIETY 09/10/2008  . CHRONIC PAIN SYNDROME 07/07/2008  . Essential hypertension 07/07/2008    Orientation RESPIRATION BLADDER Height & Weight     Self  Normal Continent, External catheter Weight: 143 lb (64.9 kg) Height:  5\' 1"  (154.9 cm)  BEHAVIORAL SYMPTOMS/MOOD NEUROLOGICAL BOWEL NUTRITION STATUS  (None) (Dementia)  Incontinent Diet(Soft)  AMBULATORY STATUS COMMUNICATION OF NEEDS Skin   Extensive Assist Verbally Bruising                       Personal Care Assistance Level of Assistance  Bathing, Feeding, Dressing Bathing Assistance: Maximum assistance Feeding assistance: Maximum assistance Dressing Assistance: Maximum assistance     Functional Limitations Info  Sight, Hearing, Speech Sight Info: Adequate Hearing Info: Adequate Speech Info: Adequate    SPECIAL CARE FACTORS FREQUENCY  PT (By licensed PT), OT (By licensed OT)     PT Frequency: 5 x week OT Frequency: 5 x week            Contractures Contractures Info: Not present    Additional Factors Info  Code Status, Allergies Code Status Info: DNR Allergies Info: NKDA           Current Medications (09/14/2017):  This is the current hospital active medication list Current Facility-Administered Medications  Medication Dose Route Frequency Provider Last Rate Last Dose  . 0.9 %  sodium chloride infusion  250 mL Intravenous PRN Merton Border, MD 10 mL/hr at 09/11/17 2233 250 mL at 09/11/17 2233  . apixaban (ELIQUIS) tablet 5 mg  5 mg Oral BID Merton Border, MD   5 mg at 09/13/17 2134  . furosemide (LASIX) injection 40 mg  40 mg Intravenous Once [facility], Omair Latif, DO      . hydrALAZINE (APRESOLINE) injection 10 mg  10 mg Intravenous Q6H PRN Raiford Noble Latif, DO   10 mg at 09/13/17 1321  . ipratropium (ATROVENT) nebulizer solution 0.5 mg  0.5 mg Nebulization Q6H Sheikh, Omair Webster,  DO   0.5 mg at 09/14/17 1026  . ipratropium-albuterol (DUONEB) 0.5-2.5 (3) MG/3ML nebulizer solution 3 mL  3 mL Nebulization Q6H PRN Merton Border, MD   3 mL at 09/14/17 0323  . levalbuterol (XOPENEX) nebulizer solution 0.63 mg  0.63 mg Nebulization Q6H Sheikh, Omair Latif, DO   0.63 mg at 09/14/17 1026  . methylPREDNISolone sodium succinate (SOLU-MEDROL) 40 mg/mL injection 40 mg  40 mg Intravenous Q12H Sheikh, Omair Latif, DO      . ondansetron  [facility]) tablet 4 mg  4 mg Oral Q6H PRN Merton Border, MD       Or  . ondansetron (ZOFRAN) injection 4 mg  4 mg Intravenous Q6H PRN Merton Border, MD      . piperacillin-tazobactam (ZOSYN) IVPB 3.375 g  3.375 g Intravenous Q8H Sheikh, Omair Latif, DO      . sodium chloride flush (NS) 0.9 % injection 3 mL  3 mL Intravenous Q12H Merton Border, MD   3 mL at 09/12/17 2102  . sodium chloride flush (NS) 0.9 % injection 3 mL  3 mL Intravenous PRN Merton Border, MD         Discharge Medications: Please see discharge summary for a list of discharge medications.  Relevant Imaging Results:  Relevant Lab Results:   Additional Information SS#: [id number]  Candie Chroman, LCSW

## 2017-09-14 NOTE — Evaluation (Signed)
Clinical/Bedside Swallow Evaluation Patient Details  Name: Heather Jarvis MRN: [id number] Date of Birth: 05-26-1928  Today's Date: 09/14/2017 Time: SLP Start Time (ACUTE ONLY): 7035 SLP Stop Time (ACUTE ONLY): 1154 SLP Time Calculation (min) (ACUTE ONLY): 23 min  Past Medical History:  Past Medical History:  Diagnosis Date  . Arthritis   . Hyperlipidemia   . Hypertension   . Osteoporosis   . Sick sinus syndrome [facility])    Past Surgical History:  Past Surgical History:  Procedure Laterality Date  . ABDOMINAL HYSTERECTOMY    . APPENDECTOMY    . PACEMAKER GENERATOR CHANGE  02/04/2012  . PACEMAKER GENERATOR CHANGE N/A 02/04/2012   Procedure: PACEMAKER GENERATOR CHANGE;  Surgeon: Evans Lance, MD;  Location: [facility] CATH LAB;  Service: Cardiovascular;  Laterality: N/A;  . TONSILLECTOMY    . VESICOVAGINAL FISTULA CLOSURE W/ TAH     HPI:  84 y.o. female, 84 year old female with past medical history significant for advanced dementia, hypertension, atrial fibrillation with history of sick sinus syndrome status post pacemaker who was brought by her family today because of increased lethargy and worsening mental status .  The family was in the process of placing her in the nursing home in [place] .  Patient is confused and cannot give any history.  She lives with her son who is not able to take care of her anymore.  In the emergency room , work-up showed leukocytosis with mild congestive heart failure, mild renal insufficiency and UTI. CXR on 09/14/17 indicated Cardiomegaly with increased prominent patchy parahilar opacities in both lungs, favor worsening pulmonary edema due to congestive heart failure. 2. Probable small left pleural effusion  Assessment / Plan / Recommendation Clinical Impression   Pt presents with oropharyngeal dysphagia characterized by suspected delay in the initiation of the swallow with all consistencies (could be age-related), impaired mastication (with solids), multiple swallows  noted with solids (xerostomia may be a factor) and immediate cough with thin via straw sips; breathing/swallowing reciprocity a factor throughout BSE d/t pt's shallow, clavicular breathing pattern noted prior to and during PO intake; pt is at moderate risk for aspiration at this time d/t cognitive impairment paired with decreased respiratory status; recommend NPO until medical issues resolve and need for objective testing prn for swallowing function; ST will f/u for swallow re-assessment during acute stay and diet progression as warranted. SLP Visit Diagnosis: Dysphagia, oropharyngeal phase (R13.12)    Aspiration Risk  Moderate aspiration risk    Diet Recommendation   NPO  Medication Administration: Via alternative means    Other  Recommendations Oral Care Recommendations: Oral care QID   Follow up Recommendations Other (comment)(TBD)      Frequency and Duration min 2x/week  1 week       Prognosis Prognosis for Safe Diet Advancement: Fair Barriers to Reach Goals: Cognitive deficits      Swallow Study   General Date of Onset: 09/11/17 HPI: 84 y.o. female, 84 year old female with past medical history significant for advanced dementia, hypertension, atrial fibrillation with history of sick sinus syndrome status post pacemaker who was brought by her family today because of increased lethargy and worsening mental status .  The family was in the process of placing her in the nursing home in [place] .  Patient is confused and cannot give any history.  She lives with her son who is not able to take care of her anymore.  In the emergency room , work-up showed leukocytosis with mild congestive heart failure, mild renal insufficiency  and UTI. Type of Study: Bedside Swallow Evaluation Previous Swallow Assessment: none  Diet Prior to this Study: Dysphagia 3 (soft);Thin liquids Temperature Spikes Noted: No Respiratory Status: Room air History of Recent Intubation: No Behavior/Cognition:  Alert;Cooperative;Requires cueing Oral Cavity Assessment: Dry Oral Care Completed by SLP: Recent completion by staff Oral Cavity - Dentition: Adequate natural dentition Vision: Functional for self-feeding Self-Feeding Abilities: Able to feed self;Needs assist Patient Positioning: Upright in bed Baseline Vocal Quality: Low vocal intensity;Hoarse Volitional Cough: Strong Volitional Swallow: Unable to elicit    Oral/Motor/Sensory Function Overall Oral Motor/Sensory Function: Other (comment)(appears WFL, but limited OME)   Ice Chips Ice chips: Impaired Presentation: Spoon Pharyngeal Phase Impairments: Suspected delayed Swallow   Thin Liquid Thin Liquid: Impaired Presentation: Straw;Spoon;Cup Pharyngeal  Phase Impairments: Suspected delayed Swallow;Cough - Immediate;Other (comments)(with straw)    Nectar Thick Nectar Thick Liquid: Not tested   Honey Thick Honey Thick Liquid: Not tested   Puree Puree: Within functional limits Presentation: Self Fed   Solid      Solid: Impaired Presentation: Self Fed Oral Phase Impairments: Impaired mastication Oral Phase Functional Implications: Impaired mastication;Prolonged oral transit Pharyngeal Phase Impairments: Multiple swallows;Other (comments)(dry oral cavity may contribute)        Elvina Sidle, M.S., CCC-SLP 09/14/2017,12:23 PM

## 2017-09-14 NOTE — Progress Notes (Signed)
Pharmacy Antibiotic Note  Jordan Caraveo is a 84 y.o. female admitted on 09/11/2017 with pneumonia.  Pharmacy has been consulted for Zosyn dosing. MD concerned that patient aspirated. CXR shows small bilateral pleural effusions and pulmonary edema. Currently being treated with ceftriaxone for UTI, which was discontinued. WBC continue to trend up: 15.6 today. Patient afebrile with improving Scr.   Plan: Zosyn 3.375g IV q8h (4 hour infusion).  F/U 7/6 CXR  Height: 5\' 1"  (154.9 cm) Weight: 143 lb (64.9 kg) IBW/kg (Calculated) : 47.8  Temp (24hrs), Avg:97.9 F (36.6 C), Min:97.6 F (36.4 C), Max:98.3 F (36.8 C)  Recent Labs  Lab 09/11/17 1501 09/12/17 0204 09/13/17 0819 09/14/17 0521  WBC 13.6* 11.5* 11.6* 15.6*  CREATININE 1.69* 1.68* 1.33* 1.19*    Estimated Creatinine Clearance: 28.7 mL/min (A) (by C-G formula based on SCr of 1.19 mg/dL (H)).    No Known Allergies  Antimicrobials this admission: Ceftriaxone 7/3 >> 7/5 Zosyn 7/6 >>  Microbiology results: 7/3 UCx: dirty  Thank you for allowing pharmacy to be a part of this patient's care.  Latrell Potempa A Elwyn Lowden 09/14/2017 10:28 AM

## 2017-09-14 NOTE — Discharge Instructions (Signed)

## 2017-09-14 NOTE — Progress Notes (Signed)
OT Cancellation Note  Patient Details Name: Heather Jarvis MRN: [id number] DOB: December 19, 1928   Cancelled Treatment:    Reason Eval/Treat Not Completed: Patient not medically ready. Nurse and tech in room and recent BP reading was 133/155. Will hold OT eval for now.  Sheila Oats Nicholas Ossa OTR/L 09/14/2017, 9:58 AM

## 2017-09-14 NOTE — Clinical Social Work Placement (Signed)
   CLINICAL SOCIAL WORK PLACEMENT  NOTE  Date:  09/14/2017  Patient Details  Name: Heather Jarvis MRN: [id number] Date of Birth: May 14, 1928  Clinical Social Work is seeking post-discharge placement for this patient at the [place] level of care (*CSW will initial, date and re-position this form in  chart as items are completed):  Yes   Patient/family provided with [facility] Work Department's list of facilities offering this level of care within the geographic area requested by the patient (or if unable, by the patient's family).  Yes   Patient/family informed of their freedom to choose among providers that offer the needed level of care, that participate in Medicare, Medicaid or managed care program needed by the patient, have an available bed and are willing to accept the patient.  Yes   Patient/family informed of [facility]'s ownership interest in [facility] and [facility], as well as of the fact that they are under no obligation to receive care at these facilities.  PASRR submitted to EDS on 09/13/17     PASRR number received on       Existing PASRR number confirmed on 09/13/17     FL2 transmitted to all facilities in geographic area requested by pt/family on 09/14/17     FL2 transmitted to all facilities within larger geographic area on       Patient informed that his/her managed care company has contracts with or will negotiate with certain facilities, including the following:            Patient/family informed of bed offers received.  Patient chooses bed at       Physician recommends and patient chooses bed at      Patient to be transferred to   on  .  Patient to be transferred to facility by       Patient family notified on   of transfer.  Name of family member notified:        PHYSICIAN Please sign FL2     Additional Comment:    _______________________________________________ Candie Chroman, LCSW 09/14/2017, 11:45  AM

## 2017-09-14 NOTE — Progress Notes (Deleted)
OT Cancellation Note  Patient Details Name: Heather Jarvis MRN: [id number] DOB: [birth year]   Cancelled Treatment:    Reason Eval/Treat Not Completed: Other (comment)(OT screened and planning to go to SNF.) No apparent immediate acute care OT needs, therefore will defer OT to SNF. If OT eval is needed please call Acute Rehab Dept. at [phone] or text page OT at [phone].    Sheila Oats Heather Jarvis OTR/L 09/14/2017, 7:42 AM

## 2017-09-14 NOTE — Progress Notes (Signed)
PROGRESS NOTE    Heather Jarvis  GQQ:[id number] DOB: Apr 11, 1928 DOA: 09/11/2017 PCP: Eulas Post, MD  Brief Narrative: Heather Jarvis  is a 84 y.o. female, 84 year old female with past medical history significant for advanced dementia, hypertension, atrial fibrillation with history of sick sinus syndrome status post pacemaker who was brought by her family today because of increased lethargy and worsening mental status .  The family was in the process of placing her in the nursing home in [place] .  Patient is confused and cannot give any history.  She lives with her son who is not able to take care of her anymore.  In the emergency room , work-up showed leukocytosis with ? congestive heart failure, mild renal insufficiency and suspected UTI. Found to have severe electrolyte abnormalities that are slowly being corrected.  This morning the patient went into acute respiratory distress and was found to be volume overloaded likely aspirated. IV fluids were stopped.  She was given IV Lasix, Solu-Medrol, and a stat portable chest x-Siple, started on Xopenex/Atrovent, and Foley catheter placed.  She is transferred to telemetry and SLP was done who recommended n.p.o.  Assessment & Plan:   Active Problems:   Cardiac pacemaker in situ   Dementia   Hypokalemia   Hypercalcemia   CKD (chronic kidney disease), stage III (HCC)   AF (paroxysmal atrial fibrillation) (HCC)   UTI (urinary tract infection)   Hypomagnesemia   Suspected UTI   Acute respiratory failure with hypoxia (HCC)   Aspiration into airway   Acute diastolic CHF (congestive heart failure) (HCC)  Acute Respiratory Failure with Hypoxia in the setting of Diastolic CHF Exacerbation along with likely Aspiration -Patient had increased work of Breathing and had to be placed on Supplemental O2 via [place] -ABG showed Hypoxemia with 7.512/29.1/66.4/23.2/94.9% on 2 Liters  -STAT CXR showed Cardiomegaly with increased prominent patchy parahilar opacities in  both lungs, favor worsening pulmonary edema due to congestive heart failure. Probable small left pleural effusion. -Started Xopenex/Atrovent Scheduled and c/w DuoNeb 3 mL q6hprn Wheezing/SOB -Started Methylprednisolone 40 mg IV q12h -ECHOCardiogram showed The Left Ventricle cavity size was normal. Wall thickness was increased in a pattern of moderate LVH. Systolic function was  normal. The estimated ejection fraction was in the range of 60%  to 65%. The study is not technically sufficient to allow  evaluation of LV diastolic function. -Stop IVF hydration and Start Diuresis with IV Furosemide 40 mg BID -Flutter Valve and Incentive Spirometry  -Aspiration Precautions -Change IV Ceftriaxone to IV Zosyn given concern of Aspiration  -SLP Evaluate and Treat and recommending NPO at this time so will not give po Medications and change Apixaban to IV Heparin gtt -Repeat CXR in AM -If does not improve will need to be placed on BiPAP and transferred to SDU  Acute Decompensation of Diastolic CHF -As above -C/w Diuresis with IV Lasix 40 mg BID -Strict I's/O's and Daily Weights -Patient is +6.682 Liters since Admission -Check BNP -ECHO as below -Repeat CXR in AM  -Place Foley Catheter for Aggressive Diuresis  -Continue to Monitor volume status carefully -Place on Telemetry   Bilateral Pulmonary Edema, worsened  -Seen on Admission CXR -Appeared Clinically Dry on admission so was rehydrated  -ECHO Done and as below and showed EF of 60-65% -TSH was normal at 3.043 -Repeat CXR this AM as above  -Treatment as above  Acute Encephalopathy; Multifactorial but in the setting of Worsening/Advanced Dementia, Hypercalcemia, and UTI, improving  -Head CT w/o Contrast done and  showed Chronic atrophic and ischemic changes stable from the prior exam. No acute abnormality is noted. -Delirium Precautions -Family stated Mentation is improving -UTI being treated and Calcium trending down and is no 9.4 -Had to be  given 2.5 mg Haldol for Severe Agitation a few nights ago -Was able to tell me she was in the hospital today   Suspected UTI -WBC on Admission was 13.6 and trended down to 11.6 but now trended up to 15.6 -Patient's urinalysis showed a cloudy appearance with trace leukocytes, negative nitrites, no bacteria seen, 6-10 WBCs, -Urine culture sent and showed multiple species and suggested recollection we will send another urine culture -Continued with empiric antibiotics started on admission with IV Ceftriaxone but will now change to IV Zosyn for concern of Aspiration   Severe Hypokalemia, improving  -Patient's K+ went from 3.3 to <2.0 and is now 4.0 -IVF Stopped  -Continue to Monitor and Replete as Necessary -Repeat CMP in AM   Hypercalcemia, improved  -Patient's Ca2+ worsened and went from 12.6 -> 12.8 but now improving and is 9.4 -Started gentle IVF resucitation with NaCl + 40 mEQ of KCl at a rate of 50 mL/hr and changed but now will Stop -Checked  PTH 20, PTHrP pending, 25 Hydroxy Vitamin D was 52.6, 1,25 Vitamin D ordered and pending  -Continue to Monitor and repeat CMP in AM   Hypomagnesemia -Patient's Mag Level was 1.6 -Replete with IV Mag Sulfate 2 grams  -Continue to Monitor and Replete as Necessary -Repeat Mag Level in AM   Hypophosphatemia -Patient's Phos Level was 1.9 and improved to 4.6  -Replete with IV KPhos 30 mmol yesterday  -Continue to Monitor and Replete as Necessary -Repeat CMP In AM   Hypernatremia -Patient's Na+ was worsening and went from 142 -> 149 but now improved to 145 -Stopped IVF as above -Repeat CMP in AM   Dehydration and Renal Insuffiencey in the setting of CKD Stage 3, improved -Patient's BUN/Cr on Admission was 28/1.69 and is now 23/1.33 -Na+ is now 145 and Ca2+ is 9.4 -Started gentle Hydration with NS + 40 mEQ KCl at 50 mL/hr and changed to D5W + 20 mEQ KCl but now will stop -Continue to Monitor and repeat CMP in AM   Atrial Fibrillation with  Hx of SSS s/p Pacer -Hold Apixaban 5 mg po BID given NPO and start Heparin gtt -Placed on Telemetry  -Not on any rate control meds at Home  DVT prophylaxis: Anticoagulated with Apixaban Code Status: DO NOT RESUSCITATE  Family Communication: No family present at bedside  Disposition Plan: SNF if patient's family agreeable when patient is medically stable   Consultants:   None   Procedures:  ECHOCARDIOGRAM ------------------------------------------------------------------- Study Conclusions  - Left ventricle: The cavity size was normal. Wall thickness was   increased in a pattern of moderate LVH. Systolic function was   normal. The estimated ejection fraction was in the range of 60%   to 65%. The study is not technically sufficient to allow   evaluation of LV diastolic function. - Aortic valve: Mildly to moderately calcified annulus. Trileaflet;   moderately thickened leaflets. There was mild to moderate   stenosis. There was mild regurgitation. Mean gradient (S): 13 mm   Hg. Valve area (VTI): 1.01 cm^2. Valve area (Vmax): 1.02 cm^2.   Valve area (Vmean): 1.02 cm^2. - Mitral valve: Moderately calcified annulus. Mildly thickened   leaflets . There was mild regurgitation. - Left atrium: The atrium was severely dilated. - Right atrium: The  atrium was mildly dilated. - Atrial septum: No defect or patent foramen ovale was identified. - Technically adequate study.   Antimicrobials:  Anti-infectives (From admission, onward)   Start     Dose/Rate Route Frequency Ordered Stop   09/14/17 1200  piperacillin-tazobactam (ZOSYN) IVPB 3.375 g     3.375 g 12.5 mL/hr over 240 Minutes Intravenous Every 8 hours 09/14/17 1034     09/11/17 2200  cefTRIAXone (ROCEPHIN) 1 g in sodium chloride 0.9 % 100 mL IVPB  Status:  Discontinued     1 g 200 mL/hr over 30 Minutes Intravenous Every 24 hours 09/11/17 2107 09/14/17 1018     Subjective: Seen and examined at bedside breathing a little bit  heavier today.  On examination she had some crackles.  This morning she is doing okay but later on in the morning she became very dyspneic and had to be placed on oxygen.  Upon evaluation this morning she was being fed however she was not sitting upright and likely may have aspirated.  Patient was able to tell me that she is in the hospital today.  Denies any chest pain but did state that she was short of breath.  No lightheadedness or dizziness.  No other concerns or complaints at this time  Objective: Vitals:   09/14/17 0957 09/14/17 1005 09/14/17 1325 09/14/17 1330  BP: (!) 155/133 (!) 187/101  (!) 163/102  Pulse: (!) 45 (!) 4  79  Resp: (!) 22  (!) 35 (!) 28  Temp: 97.8 F (36.6 C)   98 F (36.7 C)  TempSrc: Oral   Oral  SpO2: 96%   97%  Weight:      Height:        Intake/Output Summary (Last 24 hours) at 09/14/2017 1401 Last data filed at 09/14/2017 0352 Gross per 24 hour  Intake 1946.25 ml  Output 500 ml  Net 1446.25 ml   Filed Weights   09/11/17 1443 09/12/17 2201  Weight: 63.5 kg (140 lb) 64.9 kg (143 lb)   Examination: Physical Exam:  Constitutional: Well-developed elderly pleasantly demented Caucasian female who is currently in mild respiratory distress and is having some tachypnea Eyes: Sclera anicteric.  Lids and conjunctive are normal. ENMT: External ears and nose appear normal.  She is hard of hearing Neck: Appears supple with no JVD Respiratory: Diminished to auscultation bilaterally with upper airway wheezing and some crackles bilaterally.  Patient is tachypneic but not using accessory muscles to breathe.  She has been placed on supplemental oxygen via nasal cannula and has slightly labored breathing Cardiovascular: Irregularly irregular and slightly tachycardic.  No appreciable murmurs, rubs, gallops.  Has some 1+ lower extremity edema Abdomen: Soft, nontender, nondistended.  Bowel sounds present in 4 quadrants GU: Deferred Musculoskeletal: Contractures or  cyanosis.  No joint deformity noted Skin: Skin is warm and dry with no appreciable rashes or lesions on limited skin evaluation Neurologic: Cranial nerves II through XII grossly intact with no appreciable focal deficits Psychiatric: Judgment intact.  Patient is awake alert and oriented x2.  She is pleasantly still confused slightly present normal mood and affect  Data Reviewed: I have personally reviewed following labs and imaging studies  CBC: Recent Labs  Lab 09/11/17 1501 09/12/17 0204 09/13/17 0819 09/14/17 0521  WBC 13.6* 11.5* 11.6* 15.6*  NEUTROABS 10.4*  --  8.3* 11.7*  HGB 13.9 13.9 12.4 13.0  HCT 41.8 41.3 37.8 39.8  MCV 99.5 100.0 100.5* 102.1*  PLT 251 288 255 266   Basic  Metabolic Panel: Recent Labs  Lab 09/11/17 1501 09/12/17 0204 09/13/17 0819 09/14/17 0521  NA 143 146* 149* 145  K 3.3* <2.0* 3.0* 4.0  CL 98 98 109 111  CO2 31 36* 30 23  GLUCOSE 94 70 106* 130*  BUN 28* 29* 23 25*  CREATININE 1.69* 1.68* 1.33* 1.19*  CALCIUM 12.6* 12.8* 10.6*  10.8* 9.4  MG  --  1.5* 1.9 1.6*  PHOS  --  2.7 1.9* 4.6   GFR: Estimated Creatinine Clearance: 28.7 mL/min (A) (by C-G formula based on SCr of 1.19 mg/dL (H)). Liver Function Tests: Recent Labs  Lab 09/11/17 1501 09/12/17 0204 09/13/17 0819 09/14/17 0521  AST 50* 27 23 30   ALT 15 17 16 23   ALKPHOS 63 64 62 66  BILITOT 1.4* 1.0 0.7 0.7  PROT 6.6 7.0 6.2* 6.3*  ALBUMIN 3.4* 3.5 3.2* 3.0*   No results for input(s): LIPASE, AMYLASE in the last 168 hours. No results for input(s): AMMONIA in the last 168 hours. Coagulation Profile: No results for input(s): INR, PROTIME in the last 168 hours. Cardiac Enzymes: No results for input(s): CKTOTAL, CKMB, CKMBINDEX, TROPONINI in the last 168 hours. BNP (last 3 results) No results for input(s): PROBNP in the last 8760 hours. HbA1C: No results for input(s): HGBA1C in the last 72 hours. CBG: No results for input(s): GLUCAP in the last 168 hours. Lipid  Profile: No results for input(s): CHOL, HDL, LDLCALC, TRIG, CHOLHDL, LDLDIRECT in the last 72 hours. Thyroid Function Tests: Recent Labs    09/11/17 2210  TSH 3.043   Anemia Panel: Recent Labs    09/11/17 2210  VITAMINB12 857   Sepsis Labs: No results for input(s): PROCALCITON, LATICACIDVEN in the last 168 hours.  Recent Results (from the past 240 hour(s))  Urine culture     Status: Abnormal   Collection Time: 09/11/17  5:09 PM  Result Value Ref Range Status   Specimen Description URINE, CLEAN CATCH  Final   Special Requests   Final    NONE Performed at [facility], [address], [place], [place] [address]    Culture MULTIPLE SPECIES PRESENT, SUGGEST RECOLLECTION (A)  Final   Report Status 09/12/2017 FINAL  Final     Radiology Studies: Dg Chest Port 1 View  Result Date: 09/14/2017 CLINICAL DATA:  Dyspnea EXAM: PORTABLE CHEST 1 VIEW COMPARISON:  09/11/2017 chest radiograph. FINDINGS: Stable configuration of 2 lead left subclavian pacemaker. Stable cardiomediastinal silhouette with mild to moderate cardiomegaly. No pneumothorax. No significant right pleural effusion. Probable small left pleural effusion. Increased prominent patchy parahilar opacities in both lungs. IMPRESSION: 1. Cardiomegaly with increased prominent patchy parahilar opacities in both lungs, favor worsening pulmonary edema due to congestive heart failure. 2. Probable small left pleural effusion. Electronically Signed   By: Ilona Sorrel M.D.   On: 09/14/2017 10:55   Scheduled Meds: . furosemide  40 mg Intravenous Once  . ipratropium  0.5 mg Nebulization Q6H  . levalbuterol  0.63 mg Nebulization Q6H  . methylPREDNISolone (SOLU-MEDROL) injection  40 mg Intravenous Q12H  . sodium chloride flush  3 mL Intravenous Q12H   Continuous Infusions: . sodium chloride 250 mL (09/11/17 2233)  . piperacillin-tazobactam (ZOSYN)  IV 3.375 g (09/14/17 1217)    LOS: 3 days    Kerney Elbe, DO [facility] Pager [phone]  If 7PM-7AM, please contact night-coverage [email or web address] Password [email or web address] 09/14/2017, 2:01 PM

## 2017-09-14 NOTE — Progress Notes (Signed)
ANTICOAGULATION CONSULT NOTE - Follow Up Consult  Pharmacy Consult for heparin Indication: atrial fibrillation  No Known Allergies  Patient Measurements: Height: 5\' 1"  (154.9 cm) Weight: 143 lb (64.9 kg) IBW/kg (Calculated) : 47.8 Heparin Dosing Weight: 60.9  Vital Signs: Temp: 98 F (36.7 C) (07/06 1330) Temp Source: Oral (07/06 1330) BP: 163/102 (07/06 1330) Pulse Rate: 79 (07/06 1330)  Labs: Recent Labs    09/12/17 0204 09/13/17 0819 09/14/17 0521  HGB 13.9 12.4 13.0  HCT 41.3 37.8 39.8  PLT 288 255 266  CREATININE 1.68* 1.33* 1.19*    Estimated Creatinine Clearance: 28.7 mL/min (A) (by C-G formula based on SCr of 1.19 mg/dL (H)).   Medications:  Scheduled:  . furosemide  40 mg Intravenous Once  . ipratropium  0.5 mg Nebulization Q6H  . levalbuterol  0.63 mg Nebulization Q6H  . methylPREDNISolone (SOLU-MEDROL) injection  40 mg Intravenous Q12H  . sodium chloride flush  3 mL Intravenous Q12H    Assessment: 66 YOF with history of afib s/p pacemaker. Patient on apixaban at home. Currently being treated for UTI and possible aspiration PNA. Last dose of apixaban administered 7/5 at 2134.   Goal of Therapy:  Heparin level 0.3-0.7 units/ml aPTT 66-102 seconds Monitor platelets by anticoagulation protocol: Yes   Plan:  Heparin loading dose of 3,000 units Start heparin infusion at 900 units/hr Check anti-Xa level in 8 hours and daily while on heparin Continue to monitor H&H and platelets  Tracker Mance A Ramez Arrona 09/14/2017,2:14 PM

## 2017-09-14 NOTE — Progress Notes (Signed)
ANTICOAGULATION CONSULT NOTE - Follow Up Consult  Pharmacy Consult for heparin Indication: atrial fibrillation  No Known Allergies  Patient Measurements: Height: 5\' 1"  (154.9 cm) Weight: 148 lb (67.1 kg) IBW/kg (Calculated) : 47.8 Heparin Dosing Weight: 60.9  Vital Signs: Temp: 97.8 F (36.6 C) (07/06 2041) Temp Source: Oral (07/06 2041) BP: 167/132 (07/06 2041) Pulse Rate: 71 (07/06 2041)  Labs: Recent Labs    09/12/17 0204 09/13/17 0819 09/14/17 0521 09/14/17 1417 09/14/17 2253  HGB 13.9 12.4 13.0  --   --   HCT 41.3 37.8 39.8  --   --   PLT 288 255 266  --   --   APTT  --   --   --  31 92*  HEPARINUNFRC  --   --   --  >2.20*  --   CREATININE 1.68* 1.33* 1.19*  --   --     Estimated Creatinine Clearance: 29.2 mL/min (A) (by C-G formula based on SCr of 1.19 mg/dL (H)).  Assessment: 18 YOF with history of afib s/p pacemaker. Patient on apixaban at home. Currently being treated for UTI and possible aspiration PNA.  Last dose of apixaban administered 7/5 at 2134, now on heparin given NPO status.  APTT this evening in range at 99 seconds (not using heparin levels since they will be falsely elevated by apixaban). No bleeding noted  Goal of Therapy:  Heparin level 0.3-0.7 units/ml aPTT 66-102 seconds Monitor platelets by anticoagulation protocol: Yes   Plan:  Continue heparin infusion at 900 units/hr Daily heparin level and aPTT until correlating, daily CBC  Nedra Mcinnis D. Kalaysia Demonbreun, PharmD, BCPS Clinical Pharmacist [phone] Please check AMION for all [place] numbers 09/14/2017 11:46 PM

## 2017-09-15 ENCOUNTER — Inpatient Hospital Stay (HOSPITAL_COMMUNITY): Payer: Medicare HMO

## 2017-09-15 LAB — COMPREHENSIVE METABOLIC PANEL
ALK PHOS: 65 U/L (ref 38–126)
ALT: 25 U/L (ref 0–44)
AST: 27 U/L (ref 15–41)
Albumin: 3.1 g/dL — ABNORMAL LOW (ref 3.5–5.0)
Anion gap: 16 — ABNORMAL HIGH (ref 5–15)
BILIRUBIN TOTAL: 1.2 mg/dL (ref 0.3–1.2)
BUN: 27 mg/dL — AB (ref 8–23)
CO2: 28 mmol/L (ref 22–32)
CREATININE: 1.7 mg/dL — AB (ref 0.44–1.00)
Calcium: 9.1 mg/dL (ref 8.9–10.3)
Chloride: 104 mmol/L (ref 98–111)
GFR calc Af Amer: 30 mL/min — ABNORMAL LOW (ref >60)
GFR, EST NON AFRICAN AMERICAN: 26 mL/min — AB (ref >60)
GLUCOSE: 147 mg/dL — AB (ref 70–99)
Potassium: 3 mmol/L — ABNORMAL LOW (ref 3.5–5.1)
Sodium: 148 mmol/L — ABNORMAL HIGH (ref 135–145)
TOTAL PROTEIN: 6.7 g/dL (ref 6.5–8.1)

## 2017-09-15 LAB — CBC WITH DIFFERENTIAL/PLATELET
ABS IMMATURE GRANULOCYTES: 0.2 10*3/uL — AB (ref 0.0–0.1)
Basophils Absolute: 0 10*3/uL (ref 0.0–0.1)
Basophils Relative: 0 %
EOS ABS: 0 10*3/uL (ref 0.0–0.7)
Eosinophils Relative: 0 %
HEMATOCRIT: 39.4 % (ref 36.0–46.0)
HEMOGLOBIN: 13.2 g/dL (ref 12.0–15.0)
IMMATURE GRANULOCYTES: 1 %
LYMPHS PCT: 2 %
Lymphs Abs: 0.5 10*3/uL — ABNORMAL LOW (ref 0.7–4.0)
MCH: 33.7 pg (ref 26.0–34.0)
MCHC: 33.5 g/dL (ref 30.0–36.0)
MCV: 100.5 fL — AB (ref 78.0–100.0)
MONOS PCT: 3 %
Monocytes Absolute: 0.7 10*3/uL (ref 0.1–1.0)
NEUTROS PCT: 94 %
Neutro Abs: 19 10*3/uL — ABNORMAL HIGH (ref 1.7–7.7)
Platelets: 258 10*3/uL (ref 150–400)
RBC: 3.92 MIL/uL (ref 3.87–5.11)
RDW: 13.9 % (ref 11.5–15.5)
WBC: 20.3 10*3/uL — ABNORMAL HIGH (ref 4.0–10.5)

## 2017-09-15 LAB — HEPARIN LEVEL (UNFRACTIONATED)
Heparin Unfractionated: >2.2 IU/mL — ABNORMAL HIGH (ref 0.30–0.70)
Heparin Unfractionated: 1.97 IU/mL — ABNORMAL HIGH (ref 0.30–0.70)

## 2017-09-15 LAB — URINE CULTURE: CULTURE: NO GROWTH

## 2017-09-15 LAB — APTT: aPTT: 105 seconds — ABNORMAL HIGH (ref 24–36)

## 2017-09-15 LAB — PHOSPHORUS: Phosphorus: 3.1 mg/dL (ref 2.5–4.6)

## 2017-09-15 LAB — MAGNESIUM: MAGNESIUM: 1.4 mg/dL — AB (ref 1.7–2.4)

## 2017-09-15 MED ORDER — POTASSIUM CHLORIDE 20 MEQ PO PACK
40.0000 meq | PACK | Freq: Two times a day (BID) | ORAL | Status: AC
  Administered 2017-09-15 – 2017-09-16 (×2): 40 meq via ORAL
  Filled 2017-09-15 (×2): qty 2

## 2017-09-15 MED ORDER — METHYLPREDNISOLONE SODIUM SUCC 40 MG IJ SOLR
40.0000 mg | Freq: Every day | INTRAMUSCULAR | Status: DC
  Administered 2017-09-15 – 2017-09-18 (×4): 40 mg via INTRAVENOUS
  Filled 2017-09-15 (×4): qty 1

## 2017-09-15 MED ORDER — APIXABAN 2.5 MG PO TABS
2.5000 mg | ORAL_TABLET | Freq: Two times a day (BID) | ORAL | Status: DC
Start: 2017-09-15 — End: 2017-09-20
  Administered 2017-09-15 – 2017-09-20 (×10): 2.5 mg via ORAL
  Filled 2017-09-15 (×10): qty 1

## 2017-09-15 MED ORDER — FUROSEMIDE 10 MG/ML IJ SOLN: 40.0000 mg | Freq: Two times a day (BID) | INTRAMUSCULAR | Status: DC

## 2017-09-15 MED ORDER — FUROSEMIDE 10 MG/ML IJ SOLN
40.0000 mg | Freq: Once | INTRAMUSCULAR | Status: AC
  Administered 2017-09-15: 40 mg via INTRAVENOUS
  Filled 2017-09-15: qty 4

## 2017-09-15 MED ORDER — MAGNESIUM SULFATE 2 GM/50ML IV SOLN
2.0000 g | Freq: Once | INTRAVENOUS | Status: AC
  Administered 2017-09-15: 2 g via INTRAVENOUS
  Filled 2017-09-15: qty 50

## 2017-09-15 MED ORDER — POTASSIUM CHLORIDE 10 MEQ/100ML IV SOLN
10.0000 meq | INTRAVENOUS | Status: AC
  Administered 2017-09-15 (×4): 10 meq via INTRAVENOUS
  Filled 2017-09-15 (×4): qty 100

## 2017-09-15 NOTE — Progress Notes (Signed)
Pt refused neb tx.

## 2017-09-15 NOTE — Progress Notes (Signed)
SLP Cancellation Note  Patient Details Name: Heather Jarvis MRN: [id number] DOB: 02-13-1929   Cancelled treatment:       Reason Eval/Treat Not Completed: Other (comment). Pt confused, not tolerating repositioning adequate for safe PO trials. Will continue efforts. RN to page if improvements in mentation.  Deneise Lever, [place], [place] Speech-Language Pathologist [place] 09/15/2017, 10:00 AM

## 2017-09-15 NOTE — Progress Notes (Signed)
  Speech Language Pathology Treatment: Dysphagia  Patient Details Name: Heather Jarvis MRN: [id number] DOB: December 11, 1928 Today's Date: 09/15/2017 Time: 4327-6147 SLP Time Calculation (min) (ACUTE ONLY): 17 min  Assessment / Plan / Recommendation Clinical Impression  Patient seen for follow-up for dysphagia to reassess PO readiness. Daughter-in-law present and reports pt self-feeds at home, though she occasionally has to "take her plate away" because pt eats too fast. She reports pt eat with a tablespoon at home. Pt tolerates upright positioning and is alert, interactive with SLP, following commands although she is confused. Able to self feed thin liquids, purees and regular solids, with single cough noted x1 with rapid intake of puree. With mod cues to slow rate, no further signs of aspiration. Mastication and oral clearance are adequate, and oral holding noted yesterday is not apparent today. Daughter-in-law reports she chops pt's meats finely at home. Will initiate dys 2, thin liquids, meds whole with liquid or in puree (as tolerated), recommend full supervision to ensure pt is fully alert, cue for slow rate due to impulsivity. Will follow up.      HPI HPI: 84 y.o. female, 84 year old female with past medical history significant for advanced dementia, hypertension, atrial fibrillation with history of sick sinus syndrome status post pacemaker who was brought by her family today because of increased lethargy and worsening mental status .  The family was in the process of placing her in the nursing home in [place] .  Patient is confused and cannot give any history.  She lives with her son who is not able to take care of her anymore.  In the emergency room , work-up showed leukocytosis with mild congestive heart failure, mild renal insufficiency and UTI.      SLP Plan  Continue with current plan of care       Recommendations  Diet recommendations: Dysphagia 2 (fine chop);Thin liquid Liquids provided  via: Cup;Straw Medication Administration: Whole meds with liquid(or whole in puree) Supervision: Patient able to self feed;Full supervision/cueing for compensatory strategies Compensations: Slow rate;Small sips/bites;Minimize environmental distractions Postural Changes and/or Swallow Maneuvers: Seated upright 90 degrees                Oral Care Recommendations: Oral care BID Follow up Recommendations: Other (comment)(tbd) SLP Visit Diagnosis: Dysphagia, oropharyngeal phase (R13.12) Plan: Continue with current plan of care       L[place], [place], [place] Speech-Language Pathologist [phone]  Aliene Altes 09/15/2017, 1:46 PM

## 2017-09-15 NOTE — Progress Notes (Signed)
ANTICOAGULATION CONSULT NOTE - Follow Up Consult  Pharmacy Consult for heparin Indication: atrial fibrillation  No Known Allergies  Patient Measurements: Height: 5\' 1"  (154.9 cm) Weight: 148 lb (67.1 kg) IBW/kg (Calculated) : 47.8 Heparin Dosing Weight: 60.9  Vital Signs: Temp: 98.1 F (36.7 C) (07/07 0849) Temp Source: Oral (07/07 0849) BP: 117/99 (07/07 0849) Pulse Rate: 106 (07/07 0849)  Labs: Recent Labs    09/13/17 0819 09/14/17 0521 09/14/17 1417 09/14/17 2253 09/15/17 0656  HGB 12.4 13.0  --   --  13.2  HCT 37.8 39.8  --   --  39.4  PLT 255 266  --   --  258  APTT  --   --  31 92* 105*  HEPARINUNFRC  --   --  >2.20* >2.20* 1.97*  CREATININE 1.33* 1.19*  --   --  1.70*    Estimated Creatinine Clearance: 20.4 mL/min (A) (by C-G formula based on SCr of 1.7 mg/dL (H)).   Medications:  Scheduled:  . furosemide  40 mg Intravenous Once  . ipratropium  0.5 mg Nebulization TID  . levalbuterol  0.63 mg Nebulization TID  . methylPREDNISolone (SOLU-MEDROL) injection  40 mg Intravenous Daily  . sodium chloride flush  3 mL Intravenous Q12H    Assessment: 75 YOF with history of afib s/p pacemaker. Patient on apixaban at home. Currently being treated for UTI and possible aspiration PNA. Last dose of apixaban administered 7/5 at 2134. Currently following aPTT. Currently slightly above goal at 105 sec on 900 units/hr. CBC stable.   Goal of Therapy:  Heparin level 0.3-0.7 units/ml aPTT 66-102 seconds Monitor platelets by anticoagulation protocol: Yes   Plan:  Adjust heparin infusion to 850 units/hr Check aPTT and anti-Xa level daily while on heparin Continue to monitor H&H and platelets  Juandavid Dallman A Madsen Riddle 09/15/2017,10:28 AM

## 2017-09-15 NOTE — Progress Notes (Signed)
PROGRESS NOTE    [name]  HQP:[id number] DOB: [birth year] DOA: 09/11/2017 PCP: Eulas Post, MD  Brief Narrative: Heather Jarvis  is a 84 y.o. female, 84 year old female with past medical history significant for advanced dementia, hypertension, atrial fibrillation with history of sick sinus syndrome status post pacemaker who was brought by her family today because of increased lethargy and worsening mental status .  The family was in the process of placing her in the nursing home in [place] .  Patient is confused and cannot give any history.  She lives with her son who is not able to take care of her anymore.  In the emergency room , work-up showed leukocytosis with ? congestive heart failure, mild renal insufficiency and suspected UTI. Found to have severe electrolyte abnormalities that are slowly being corrected.  This morning the patient went into acute respiratory distress and was found to be volume overloaded likely aspirated. IV fluids were stopped.  She was given IV Lasix, Solu-Medrol, and a stat portable chest x-Gemme, started on Xopenex/Atrovent, and Foley catheter placed.  She is transferred to telemetry and SLP was done who recommended n.p.o. But today she was upgraded to Dysphagia 2 Diet. Family states she is improving slightly   Assessment & Plan:   Active Problems:   Cardiac pacemaker in situ   Dementia   Hypokalemia   Hypercalcemia   CKD (chronic kidney disease), stage III (HCC)   AF (paroxysmal atrial fibrillation) (HCC)   UTI (urinary tract infection)   Hypomagnesemia   Suspected UTI   Acute respiratory failure with hypoxia (HCC)   Aspiration into airway   Acute diastolic CHF (congestive heart failure) (HCC)  Acute Respiratory Failure with Hypoxia in the setting of Diastolic CHF Exacerbation along with likely Aspiration, improving  -Patient had increased work of Breathing and had to be placed on Supplemental O2 via [place]; Breathing has significantly improved and appeared to  have unlabored breathing today -ABG showed Hypoxemia with 7.512/29.1/66.4/23.2/94.9% on 2 Liters yesterday   -STAT CXR yesterday  showed Cardiomegaly with increased prominent patchy parahilar opacities in both lungs, favor worsening pulmonary edema due to congestive heart failure. Probable small left pleural effusion. -CXR today showed Rotated radiograph. Probable stable cardiomegaly and pulmonary edema. -Started Xopenex/Atrovent Scheduled and c/w DuoNeb 3 mL q6hprn Wheezing/SOB and will continue  -Started Methylprednisolone 40 mg IV q12h but will reduce to 40 mg Daily  -ECHOCardiogram showed The Left Ventricle cavity size was normal. Wall thickness was increased in a pattern of moderate LVH. Systolic function was  normal. The estimated ejection fraction was in the range of 60%  to 65%. The study is not technically sufficient to allow  evaluation of LV diastolic function. -Stop IVF hydration and Started Diuresis with IV Furosemide 40 mg BID; Will give IV Furosemide 40 mg x1 this AM and stop because of worsened Renal Function  -Flutter Valve and Incentive Spirometry  -Aspiration Precautions -Change IV Ceftriaxone to IV Zosyn given concern of Aspiration with Pharmacy to adjust dosing   -SLP Evaluate and Treated and recommended NPO but will change to Dysphagia 2 Diet  -Repeat CXR in AM -If does not improve will need to be placed on BiPAP and transferred to SDU  Acute Decompensation of Diastolic CHF -As above -Diuresis with IV Lasix 40 mg BID now stopped after Lasix dose this AM -Strict I's/O's and Daily Weights -Patient is now +2.3525 Liters since Admission -Checked BNP and was 831.8 -ECHO as below -Repeat CXR in AM  -Placed Foley  Catheter for Aggressive Diuresis  -Continue to Monitor volume status carefully -Place on Telemetry   Bilateral Pulmonary Edema, stable now -Seen on Admission CXR -Appeared Clinically Dry on admission so was rehydrated  -ECHO Done and as below and showed EF of  60-65% -TSH was normal at 3.043 -Repeat CXR this AM as above  -Treatment as above  Acute Encephalopathy; Multifactorial but in the setting of Worsening/Advanced Dementia, Hypercalcemia, and UTI, improving  -Head CT w/o Contrast done and showed Chronic atrophic and ischemic changes stable from the prior exam. No acute abnormality is noted. -Delirium Precautions -Family stated Mentation is improving and that she "plays possum" and has "behavioral issues."  -UTI being treated and Calcium trending down and is no 9.1 -Had to be given 2.5 mg Haldol for Severe Agitation during hospitalization   Suspected UTI -WBC on Admission was 13.6 and trended down to 11.6 but now trended up now and is going from 15.6 -> 20.3 -Patient's urinalysis showed a cloudy appearance with trace leukocytes, negative nitrites, no bacteria seen, 6-10 WBCs, -Urine culture sent and showed multiple species and suggested recollection we will send another urine culture -Continued with empiric antibiotics started on admission with IV Ceftriaxone but will now changed to IV Zosyn for concern of Aspiration yesterday  Hypokalemia -Patient's K+ was 3.0 this AM and dropped likely from diuresis -IVF Stopped. Replete with IV KCl 40 mEQ and will add po 40 mEQ BID x2 -Continue to Monitor and Replete as Necessary -Repeat CMP in AM   Hypercalcemia, improved  -Patient's Ca2+ worsened and went from 12.6 -> 12.8 but now improving and is 9.1 -Started gentle IVF resucitation with NaCl + 40 mEQ of KCl at a rate of 50 mL/hr and changed to D5W but now stopped. -Checked  PTH 20, PTHrP pending, 25 Hydroxy Vitamin D was 52.6, 1,25 Vitamin D ordered and pending  -Continue to Monitor and repeat CMP in AM   Hypomagnesemia -Patient's Mag Level was 1.4 -Replete with IV Mag Sulfate 2 grams again  -Continue to Monitor and Replete as Necessary -Repeat Mag Level in AM   Hypophosphatemia -Patient's Phos Level was 1.9 and improved to 3.1 -Continue to  Monitor and Replete as Necessary -Repeat CMP In AM   Hypernatremia -Patient's Na+ was worsening and went from 142 -> 149 but now improved to 145 and again worsened to 148 -Stopped IVF as above and given Lasix  -Repeat CMP in AM   Dehydration and Renal Insuffiencey in the setting of CKD Stage 3 -Patient's BUN/Cr on Admission was 28/1.69 and improved to 23/1.33 but now again worsened after IV Diuresis and Zosyn and is now 27/1.70 -Na+ is now 148 and Ca2+ is 9.1 -Started gentle Hydration with NS + 40 mEQ KCl at 50 mL/hr and changed to D5W + 20 mEQ KCl but now will stopped IVF altogether  -Will discontinue IV Lasix after this AM's dose  -Continue to Monitor and repeat CMP in AM   Atrial Fibrillation with Hx of SSS s/p Pacer -Held Apixaban 5 mg po BID given NPO and started Heparin gtt but ok to resume Apixaban now -Placed on Telemetry  -Not on any rate control meds at Home  DVT prophylaxis: Anticoagulated with Apixaban Code Status: DO NOT RESUSCITATE  Family Communication: Discussed with Daughter-In-Law at bedside Disposition Plan: SNF if patient's family agreeable when patient is medically stable   Consultants:   None   Procedures:  ECHOCARDIOGRAM ------------------------------------------------------------------- Study Conclusions  - Left ventricle: The cavity size was normal. Wall  thickness was   increased in a pattern of moderate LVH. Systolic function was   normal. The estimated ejection fraction was in the range of 60%   to 65%. The study is not technically sufficient to allow   evaluation of LV diastolic function. - Aortic valve: Mildly to moderately calcified annulus. Trileaflet;   moderately thickened leaflets. There was mild to moderate   stenosis. There was mild regurgitation. Mean gradient (S): 13 mm   Hg. Valve area (VTI): 1.01 cm^2. Valve area (Vmax): 1.02 cm^2.   Valve area (Vmean): 1.02 cm^2. - Mitral valve: Moderately calcified annulus. Mildly thickened    leaflets . There was mild regurgitation. - Left atrium: The atrium was severely dilated. - Right atrium: The atrium was mildly dilated. - Atrial septum: No defect or patent foramen ovale was identified. - Technically adequate study.   Antimicrobials:  Anti-infectives (From admission, onward)   Start     Dose/Rate Route Frequency Ordered Stop   09/14/17 1200  piperacillin-tazobactam (ZOSYN) IVPB 3.375 g     3.375 g 12.5 mL/hr over 240 Minutes Intravenous Every 8 hours 09/14/17 1034     09/11/17 2200  cefTRIAXone (ROCEPHIN) 1 g in sodium chloride 0.9 % 100 mL IVPB  Status:  Discontinued     1 g 200 mL/hr over 30 Minutes Intravenous Every 24 hours 09/11/17 2107 09/14/17 1018     Subjective: Seen and examined at bedside and did not really want to participate in examination and would not currently want to lay on one side and was acting slightly belligerent.  Family at bedside encouraged her and she agreed and was able to undergo a SLP evaluation who recommended dysphagia 2 diet.  Patient states that her breathing was okay today denies chest pain, lightheadedness or dizziness.  Thought she was in [place] this morning.  Objective: Vitals:   09/14/17 2041 09/15/17 0646 09/15/17 0849 09/15/17 1434  BP: (!) 167/132 (!) 157/118 (!) 117/99   Pulse: 71 (!) 105 (!) 106   Resp: 16 20 (!) 26   Temp: 97.8 F (36.6 C) (!) 97.5 F (36.4 C) 98.1 F (36.7 C)   TempSrc: Oral Oral Oral   SpO2: 92% 95% 97% 96%  Weight: 67.1 kg (148 lb)     Height:        Intake/Output Summary (Last 24 hours) at 09/15/2017 1751 Last data filed at 09/15/2017 1343 Gross per 24 hour  Intake 349.08 ml  Output 2475 ml  Net -2125.92 ml   Filed Weights   09/11/17 1443 09/12/17 2201 09/14/17 2041  Weight: 63.5 kg (140 lb) 64.9 kg (143 lb) 67.1 kg (148 lb)   Examination: Physical Exam:  Constitutional: Well-nourished, well-developed confused and demented Caucasian female who is not wanting to participate in  examination Eyes: Sclera anicteric.  Lids and conjunctive are normal ENMT: External ears and nose appear normal.  She is hard of hearing Neck: Appears supple with no JVD Respiratory: Diminished to auscultation bilaterally with some crackles.  Patient is slightly tachypneic but not yet much yesterday.  Not using accessory muscle usage but is continued to wear supplemental oxygen via nasal cannula Cardiovascular: Irregularly irregular and slightly tachycardic still.  No appreciable murmurs, rubs, gallops.  Has trace to 1+ lower extremity edema today Abdomen: Soft, nontender, nondistended.  Bowel sounds present in 4 quadrants GU: Deferred.  Has a Foley catheter in place Musculoskeletal: No contractures or cyanosis.  No joint deformities noted Skin: Skin is warm dry no appreciable rashes or lesions on  limited skin evaluation Neurologic: Cranial nerves II through XII grossly intact no appreciable focal deficits Psychiatric: Impaired judgment and insight.  Patient is awake, and withdrawn.  Still remains somewhat confused.  Normal mood and affect  Data Reviewed: I have personally reviewed following labs and imaging studies  CBC: Recent Labs  Lab 09/11/17 1501 09/12/17 0204 09/13/17 0819 09/14/17 0521 09/15/17 0656  WBC 13.6* 11.5* 11.6* 15.6* 20.3*  NEUTROABS 10.4*  --  8.3* 11.7* 19.0*  HGB 13.9 13.9 12.4 13.0 13.2  HCT 41.8 41.3 37.8 39.8 39.4  MCV 99.5 100.0 100.5* 102.1* 100.5*  PLT 251 288 255 266 053   Basic Metabolic Panel: Recent Labs  Lab 09/11/17 1501 09/12/17 0204 09/13/17 0819 09/14/17 0521 09/15/17 0656  NA 143 146* 149* 145 148*  K 3.3* <2.0* 3.0* 4.0 3.0*  CL 98 98 109 111 104  CO2 31 36* 30 23 28   GLUCOSE 94 70 106* 130* 147*  BUN 28* 29* 23 25* 27*  CREATININE 1.69* 1.68* 1.33* 1.19* 1.70*  CALCIUM 12.6* 12.8* 10.6*  10.8* 9.4 9.1  MG  --  1.5* 1.9 1.6* 1.4*  PHOS  --  2.7 1.9* 4.6 3.1   GFR: Estimated Creatinine Clearance: 20.4 mL/min (A) (by C-G formula  based on SCr of 1.7 mg/dL (H)). Liver Function Tests: Recent Labs  Lab 09/11/17 1501 09/12/17 0204 09/13/17 0819 09/14/17 0521 09/15/17 0656  AST 50* 27 23 30 27   ALT 15 17 16 23 25   ALKPHOS 63 64 62 66 65  BILITOT 1.4* 1.0 0.7 0.7 1.2  PROT 6.6 7.0 6.2* 6.3* 6.7  ALBUMIN 3.4* 3.5 3.2* 3.0* 3.1*   No results for input(s): LIPASE, AMYLASE in the last 168 hours. No results for input(s): AMMONIA in the last 168 hours. Coagulation Profile: No results for input(s): INR, PROTIME in the last 168 hours. Cardiac Enzymes: No results for input(s): CKTOTAL, CKMB, CKMBINDEX, TROPONINI in the last 168 hours. BNP (last 3 results) No results for input(s): PROBNP in the last 8760 hours. HbA1C: No results for input(s): HGBA1C in the last 72 hours. CBG: No results for input(s): GLUCAP in the last 168 hours. Lipid Profile: No results for input(s): CHOL, HDL, LDLCALC, TRIG, CHOLHDL, LDLDIRECT in the last 72 hours. Thyroid Function Tests: No results for input(s): TSH, T4TOTAL, FREET4, T3FREE, THYROIDAB in the last 72 hours. Anemia Panel: No results for input(s): VITAMINB12, FOLATE, FERRITIN, TIBC, IRON, RETICCTPCT in the last 72 hours. Sepsis Labs: No results for input(s): PROCALCITON, LATICACIDVEN in the last 168 hours.  Recent Results (from the past 240 hour(s))  Urine culture     Status: Abnormal   Collection Time: 09/11/17  5:09 PM  Result Value Ref Range Status   Specimen Description URINE, CLEAN CATCH  Final   Special Requests   Final    NONE Performed at [facility], [address] [place], [place] [address]    Culture MULTIPLE SPECIES PRESENT, SUGGEST RECOLLECTION (A)  Final   Report Status 09/12/2017 FINAL  Final  Culture, Urine     Status: None   Collection Time: 09/14/17  7:54 AM  Result Value Ref Range Status   Specimen Description URINE, RANDOM  Final   Special Requests NONE  Final   Culture   Final    NO GROWTH Performed at [facility], [place] [address], [place], [place] [address]    Report Status 09/15/2017 FINAL  Final     Radiology Studies: Dg Chest [facility] 1 [address]  Result Date: 09/15/2017 CLINICAL DATA:  Shortness of breath. EXAM: PORTABLE CHEST 1 VIEW COMPARISON:  09/14/2017. FINDINGS: Patient rotated.  Suboptimal images. The heart is enlarged. Dual lead pacer appears grossly unchanged. BILATERAL pulmonary opacities suggesting pulmonary edema. IMPRESSION: Rotated radiograph. Probable stable cardiomegaly and pulmonary edema. Electronically Signed   By: Staci Righter M.D.   On: 09/15/2017 07:30   Dg Chest Port 1 View  Result Date: 09/14/2017 CLINICAL DATA:  Dyspnea EXAM: PORTABLE CHEST 1 VIEW COMPARISON:  09/11/2017 chest radiograph. FINDINGS: Stable configuration of 2 lead left subclavian pacemaker. Stable cardiomediastinal silhouette with mild to moderate cardiomegaly. No pneumothorax. No significant right pleural effusion. Probable small left pleural effusion. Increased prominent patchy parahilar opacities in both lungs. IMPRESSION: 1. Cardiomegaly with increased prominent patchy parahilar opacities in both lungs, favor worsening pulmonary edema due to congestive heart failure. 2. Probable small left pleural effusion. Electronically Signed   By: Ilona Sorrel M.D.   On: 09/14/2017 10:55   Scheduled Meds: . ipratropium  0.5 mg Nebulization TID  . levalbuterol  0.63 mg Nebulization TID  . methylPREDNISolone (SOLU-MEDROL) injection  40 mg Intravenous Daily  . sodium chloride flush  3 mL Intravenous Q12H   Continuous Infusions: . sodium chloride 250 mL (09/11/17 2233)  . heparin 850 Units/hr (09/15/17 1642)  . piperacillin-tazobactam (ZOSYN)  IV 3.375 g (09/15/17 1319)    LOS: 4 days    Kerney Elbe, DO [facility] Pager [phone]  If 7PM-7AM, please contact night-coverage [email or web address] Password [email or web address] 09/15/2017, 5:51 PM

## 2017-09-16 ENCOUNTER — Encounter: Payer: Self-pay | Admitting: Internal Medicine

## 2017-09-16 ENCOUNTER — Inpatient Hospital Stay (HOSPITAL_COMMUNITY): Payer: Medicare HMO

## 2017-09-16 LAB — CALCITRIOL (1,25 DI-OH VIT D): Vit D, 1,25-Dihydroxy: 11.5 pg/mL — ABNORMAL LOW (ref 19.9–79.3)

## 2017-09-16 LAB — PTH-RELATED PEPTIDE

## 2017-09-16 MED ORDER — IPRATROPIUM BROMIDE 0.02 % IN SOLN: 0.5000 mg | Freq: Four times a day (QID) | RESPIRATORY_TRACT | Status: DC | PRN

## 2017-09-16 MED ORDER — LEVALBUTEROL HCL 0.63 MG/3ML IN NEBU: 0.6300 mg | INHALATION_SOLUTION | Freq: Four times a day (QID) | RESPIRATORY_TRACT | Status: DC | PRN

## 2017-09-16 MED ORDER — HALOPERIDOL LACTATE 5 MG/ML IJ SOLN
2.5000 mg | Freq: Once | INTRAMUSCULAR | Status: AC
  Administered 2017-09-16: 2.5 mg via INTRAVENOUS
  Filled 2017-09-16: qty 1

## 2017-09-16 NOTE — Care Management Important Message (Signed)
Important Message  Patient Details  Name: Heather Jarvis MRN: [id number] Date of Birth: [birth year]   Medicare Important Message Given:  No  Due to illness patient not able to sign/Unsigned copy left at bedside.  Heather Jarvis 09/16/2017, 4:15 PM

## 2017-09-16 NOTE — Progress Notes (Signed)
PT Cancellation Note  Patient Details Name: Heather Jarvis MRN: [id number] DOB: [birth year]   Cancelled Treatment:    Reason Eval/Treat Not Completed: Other (comment)   Per OT note, pt was combative earlier, and received Haldol; resting comfortably now.  Will reattempt PT session as schedule allows;   Roney [place], [place]  [facility] Services Pager [phone] Office [phone]    Colletta [place] 09/16/2017, 9:34 AM

## 2017-09-16 NOTE — Progress Notes (Signed)
  Speech Language Pathology Treatment: Dysphagia  Patient Details Name: Yong Wahlquist MRN: [id number] DOB: 01-Jun-1928 Today's Date: 09/16/2017 Time: 5397-6734 SLP Time Calculation (min) (ACUTE ONLY): 15 min  Assessment / Plan / Recommendation Clinical Impression  Skilled observation with thin via cup/straw, puree and regular consistency with mod verbal/tactile cues required; pt with mild expiratory wheeze when SLP arrived (minimal) which did not worsen with PO intake, but pt given mod verbal/tactile cues for slow rate d/t impulsivity/decreased sustained attention during consumption stating "I'm thirsty" during trial.  Smaller boluses of thin eliminated immediate cough (with large straw sips), which was only overt s/s of aspiration during trial.  Given pt's mentation, recommend slow rate, small bites/sips with FULL supervision during all PO intake for swallowing precautions; continue current diet of Dysphagia 2/thin with swallowing precautions; ST will f/u while in acute setting for diet tolerance/dysphagia management.  HPI HPI: 84 y.o. female, 84 year old female with past medical history significant for advanced dementia, hypertension, atrial fibrillation with history of sick sinus syndrome status post pacemaker who was brought by her family today because of increased lethargy and worsening mental status .  The family was in the process of placing her in the nursing home in [place] .  Patient is confused and cannot give any history.  She lives with her son who is not able to take care of her anymore.  In the emergency room , work-up showed leukocytosis with mild congestive heart failure, mild renal insufficiency and UTI. CXR on 09/16/17 indicated Persistent patchy consolidation left base with small pleural effusions bilaterally.  Cardiomegaly. Pulmonary vascularity normal. There is aortic atherosclerosis. Pacemaker leads attached to right atrium and right ventricle.  Aortic Atherosclerosis      SLP  Plan  Continue with current plan of care       Recommendations  Diet recommendations: Dysphagia 2 (fine chop);Thin liquid;Other(comment)(small sips) Liquids provided via: Cup;Straw Medication Administration: Whole meds with puree Supervision: Patient able to self feed;Full supervision/cueing for compensatory strategies Compensations: Slow rate;Small sips/bites;Minimize environmental distractions Postural Changes and/or Swallow Maneuvers: Seated upright 90 degrees                Oral Care Recommendations: Oral care BID Follow up Recommendations: Other (comment)(TBD) SLP Visit Diagnosis: Dysphagia, oropharyngeal phase (R13.12) Plan: Continue with current plan of care                      Elvina Sidle, M.S., CCC-SLP 09/16/2017, 3:21 PM

## 2017-09-16 NOTE — Progress Notes (Signed)
Patients grew combative and aggressive this am. Trying to scratch RN and hitting RN when trying to fix nasal cannula. Patient kept refusing to leave O2 via nasal cannula on and refused for phlebotomy to obtain blood draws. RN notified on call NP, Bodenheimer.  Order for 2.5mg  of IV Haldol placed. RN will implement and continue to monitor patient.  Ermalinda Memos, RN
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
## 2017-09-16 NOTE — Evaluation (Addendum)
Occupational Therapy Evaluation Patient Details Name: Heather Jarvis MRN: [id number] DOB: [birth year] Today's Date: 09/16/2017    History of Present Illness Pt is a 84 y.o. F with significant PMH for advanced dementia, hypertension, atrial fibrillation with history of sick sinus syndrome s/p pacemaker who presents with increased lethargy and worsening mental status, found to have UTI.   Clinical Impression   This 84 yo female admitted with above presents to acute OT with decreased mobility, decreased transfers, decreased command following all affecting her safety and independence with basic ADLs. She will benefit from acute OT with a trial follow up at SNF of OT. Pt's sats on RA at end of session 95%, made RN aware.    Follow Up Recommendations  SNF;Supervision/Assistance - 24 hour    Equipment Recommendations  None recommended by OT       Precautions / Restrictions Precautions Precautions: Fall Restrictions Weight Bearing Restrictions: No      Mobility Bed Mobility Overal bed mobility: Needs Assistance Bed Mobility: Sit to Supine Rolling: Modified independent (Device/Increase time)(pt rolled from her left side to her right side on her own, but when asked to do so she says she cannot) Sidelying to sit: Max assist   Sit to supine: Min guard   General bed mobility comments: pt resisting/not assisting due to she kept saying she really wanted to lay back down  Transfers Overall transfer level: Needs assistance Equipment used: 2 person hand held assist Transfers: Sit to/from Stand Sit to Stand: Max assist;+2 physical assistance(and use of bed pad)         General transfer comment: Pt stood up briefly with above support, but then sat right back down (she wanted to lay down)    Balance Overall balance assessment: Needs assistance Sitting-balance support: Single extremity supported;Feet supported Sitting balance-Leahy Scale: Fair     Standing balance support: Bilateral upper  extremity supported Standing balance-Leahy Scale: Zero Standing balance comment: reliant on external support                           ADL either performed or assessed with clinical judgement   ADL Overall ADL's : Needs assistance/impaired Eating/Feeding: Total assistance   Grooming: Wash/dry face;Moderate assistance;Bed level   Upper Body Bathing: Maximal assistance;Bed level   Lower Body Bathing: Total assistance;Bed level   Upper Body Dressing : Total assistance;Bed level   Lower Body Dressing: Total assistance;Bed level                       Vision Baseline Vision/History: (unknown)              Pertinent Vitals/Pain Pain Assessment: (pt did grimace when A'd with lifting leg to put pillow between knees while pt laying on her side)     Hand Dominance Right   Extremity/Trunk Assessment Upper Extremity Assessment Upper Extremity Assessment: Generalized weakness           Communication Communication Communication: No difficulties   Cognition Arousal/Alertness: Awake/alert Behavior During Therapy: (mildy agitated, but easily calmed with conversation while seated in front of her) Overall Cognitive Status: History of cognitive impairments - at baseline                                 General Comments: history of advanced dementia. able to follow simple commands inconsistently. Pt making it well known she did not want  to sit up on EOB, but wanted to lay back down. She was able to tell [place] her birthday and that she turned 55 as well as that she lives with her son              Home Living Family/patient expects to be discharged to:: Skilled nursing facility Living Arrangements: Children(lives with son)                               Additional Comments: Per chart family working on getting pt into SNF prior to admission      Prior Functioning/Environment Level of Independence: Needs assistance        Comments:  no family/caregiver present to determine PLOF. per chart review, pt son and daughter in law assist with transfers        OT Problem List: Impaired balance (sitting and/or standing);Decreased cognition      OT Treatment/Interventions: Self-care/ADL training;DME and/or AE instruction;Patient/family education;Therapeutic activities;Balance training    OT Goals(Current goals can be found in the care plan section) Acute Rehab OT Goals Patient Stated Goal: to lay down OT Goal Formulation: Patient unable to participate in goal setting Time For Goal Achievement: 09/30/17 Potential to Achieve Goals: Fair ADL Goals Pt Will Perform Grooming: with min guard assist;sitting Pt Will Transfer to Toilet: with min assist;stand pivot transfer;bedside commode Additional ADL Goal #1: Pt will be min A in and OOB for basic ADLs  OT Frequency: Min 2X/week           Co-evaluation PT/OT/SLP Co-Evaluation/Treatment: Yes Reason for Co-Treatment: For patient/therapist safety;Necessary to address cognition/behavior during functional activity   OT goals addressed during session: ADL's and self-care;Strengthening/ROM      AM-PAC PT "6 Clicks" Daily Activity     Outcome Measure Help from another person eating meals?: A Lot Help from another person taking care of personal grooming?: A Lot Help from another person toileting, which includes using toliet, bedpan, or urinal?: Total Help from another person bathing (including washing, rinsing, drying)?: A Lot Help from another person to put on and taking off regular upper body clothing?: Total Help from another person to put on and taking off regular lower body clothing?: Total 6 Click Score: 9   End of Session Equipment Utilized During Treatment: Gait belt Nurse Communication: Mobility status  Activity Tolerance: Patient tolerated treatment well Patient left: in bed;with call bell/phone within reach;with bed alarm set;Other (comment)(with low bed in lowest  position, all 4 rails up, and floor mats on each side of bed)  OT Visit Diagnosis: Unsteadiness on feet (R26.81);Other abnormalities of gait and mobility (R26.89)                Time: 1200-1230 OT Time Calculation (min): 30 min Charges:  OT General Charges $OT Visit: 1 Visit OT Evaluation $OT Eval Moderate Complexity: [address] [place] [phone] 09/16/2017

## 2017-09-16 NOTE — Progress Notes (Signed)
CSW spoke with patient's daughter-in-law. She has selected [facility]. CSW will ask them to initiate insurance authorization (may take up to 5 days).  Percell Locus Esai Stecklein LCSW [phone]

## 2017-09-16 NOTE — Progress Notes (Signed)
OT Cancellation Note  Patient Details Name: Heather Jarvis MRN: [id number] DOB: [birth year]   Cancelled Treatment:    Reason Eval/Treat Not Completed: Other (comment). Per chart pt aggressive and combative this AM and haldol given. Spoken to pt's current RN and she says pt is resting comfortably now and would be better to try and see pt later. Will re-attempt as schedule allows.   Almon Register [phone] 09/16/2017, 8:05 AM

## 2017-09-16 NOTE — Progress Notes (Signed)
RT attempted to administer nebulizer treatment to patient. Patient is very combative and agitated this morning and was swatting at RT. RT managed to place face mask on patient and administer treatment as best as could. Vitals are stable. RT will continue to monitor.

## 2017-09-16 NOTE — Plan of Care (Signed)
  Problem: Clinical Measurements: Goal: Will remain free from infection Outcome: Progressing Note:  Pt has not shown any new signs of infection during my care.    Problem: Nutrition: Goal: Adequate nutrition will be maintained Outcome: Progressing Note:  With assistance, pt was able to eat her breakfast during my shift. Will continue to monitor.   Problem: Pain Managment: Goal: General experience of comfort will improve Outcome: Progressing Note:  Pt has had no complaints of pain during my care.

## 2017-09-16 NOTE — Progress Notes (Signed)
Physical Therapy Treatment Patient Details Name: Heather Jarvis MRN: [id number] DOB: Oct 15, 1928 Today's Date: 09/16/2017    History of Present Illness Pt is a 84 y.o. F with significant PMH for advanced dementia, hypertension, atrial fibrillation with history of sick sinus syndrome s/p pacemaker who presents with increased lethargy and worsening mental status, found to have UTI.    PT Comments    Continuing work on functional mobility and activity tolerance;  Limited participation today because Heather Jarvis simply stated she wants to lay back down; Sat EOB a for approx 5-8 minutes, engaged in conversation related to her likes and dislikes; attempted to stand, but with little to no participation from patient because she simply wanted to lay back down   Follow Up Recommendations  SNF;Supervision/Assistance - 24 hour     Equipment Recommendations  Other (comment)(defer to SNF)    Recommendations for Other Services       Precautions / Restrictions Precautions Precautions: Fall Restrictions Weight Bearing Restrictions: No    Mobility  Bed Mobility Overal bed mobility: Needs Assistance Bed Mobility: Sit to Supine;Sidelying to Sit Rolling: Modified independent (Device/Increase time)(pt rolled from her left side to her right side on her own, but when asked to do so she says she cannot) Sidelying to sit: Max assist   Sit to supine: Min guard   General bed mobility comments: pt resisting/not assisting due to she kept saying she really wanted to lay back down  Transfers Overall transfer level: Needs assistance Equipment used: 2 person hand held assist Transfers: Sit to/from Stand Sit to Stand: Max assist;+2 physical assistance(and use of bed pad)         General transfer comment: Pt stood up briefly with above support, but then sat right back down (she wanted to lay down)  Ambulation/Gait                 Stairs             Wheelchair Mobility    Modified Rankin  (Stroke Patients Only)       Balance Overall balance assessment: Needs assistance Sitting-balance support: Single extremity supported;Feet supported Sitting balance-Leahy Scale: Fair     Standing balance support: Bilateral upper extremity supported Standing balance-Leahy Scale: Zero Standing balance comment: reliant on external support                            Cognition Arousal/Alertness: Awake/alert Behavior During Therapy: WFL for tasks assessed/performed Overall Cognitive Status: History of cognitive impairments - at baseline                                 General Comments: history of advanced dementia. able to follow simple commands inconsistently. Pt making it well known she did not want to sit up on EOB, but wanted to lay back down. She was able to tell [place] her birthday and that she turned 84 as well as that she lives with her son      Exercises      General Comments General comments (skin integrity, edema, etc.): Session conducted on Room Air and O2 sats remained greater tahn or equal to 93%      Pertinent Vitals/Pain Pain Assessment: Faces Faces Pain Scale: No hurt    Home Living Family/patient expects to be discharged to:: Skilled nursing facility Living Arrangements: Children(lives with son)  Additional Comments: Per chart family working on getting pt into SNF prior to admission    Prior Function Level of Independence: Needs assistance      Comments: no family/caregiver present to determine PLOF. per chart review, pt son and daughter in law assist with transfers   PT Goals (current goals can now be found in the care plan section) Acute Rehab PT Goals Patient Stated Goal: to lay down PT Goal Formulation: Patient unable to participate in goal setting Progress towards PT goals: Progressing toward goals    Frequency    Min 2X/week      PT Plan Current plan remains appropriate    Co-evaluation PT/OT/SLP  Co-Evaluation/Treatment: Yes Reason for Co-Treatment: For patient/therapist safety;Necessary to address cognition/behavior during functional activity PT goals addressed during session: Mobility/safety with mobility OT goals addressed during session: ADL's and self-care;Strengthening/ROM      AM-PAC PT "6 Clicks" Daily Activity  Outcome Measure  Difficulty turning over in bed (including adjusting bedclothes, sheets and blankets)?: None Difficulty moving from lying on back to sitting on the side of the bed? : Unable Difficulty sitting down on and standing up from a chair with arms (e.g., wheelchair, bedside commode, etc,.)?: Unable Help needed moving to and from a bed to chair (including a wheelchair)?: A Lot Help needed walking in hospital room?: A Lot Help needed climbing 3-5 steps with a railing? : Total 6 Click Score: 11    End of Session Equipment Utilized During Treatment: Gait belt Activity Tolerance: Patient tolerated treatment well Patient left: in bed;with call bell/phone within reach Nurse Communication: Mobility status PT Visit Diagnosis: Unsteadiness on feet (R26.81);Muscle weakness (generalized) (M62.81);Difficulty in walking, not elsewhere classified (R26.2)     Time: 5409-8119 PT Time Calculation (min) (ACUTE ONLY): 21 min  Charges:  $Therapeutic Activity: 8-22 mins                    G Codes:       Roney Marion, PT  Acute Rehabilitation Services Pager [phone] Office [place] 09/16/2017, 4:32 PM

## 2017-09-16 NOTE — Progress Notes (Signed)
Covering CSW continuing to follow for discharge needs.   Percell Locus Eben Choinski LCSW [phone]

## 2017-09-17 ENCOUNTER — Other Ambulatory Visit: Payer: Self-pay

## 2017-09-17 LAB — COMPREHENSIVE METABOLIC PANEL
ALBUMIN: 2.7 g/dL — AB (ref 3.5–5.0)
ALK PHOS: 59 U/L (ref 38–126)
ALT: 22 U/L (ref 0–44)
ANION GAP: 11 (ref 5–15)
AST: 17 U/L (ref 15–41)
BUN: 40 mg/dL — ABNORMAL HIGH (ref 8–23)
CALCIUM: 8 mg/dL — AB (ref 8.9–10.3)
CHLORIDE: 106 mmol/L (ref 98–111)
CO2: 26 mmol/L (ref 22–32)
Creatinine, Ser: 1.67 mg/dL — ABNORMAL HIGH (ref 0.44–1.00)
GFR calc non Af Amer: 26 mL/min — ABNORMAL LOW (ref >60)
GFR, EST AFRICAN AMERICAN: 31 mL/min — AB (ref >60)
GLUCOSE: 92 mg/dL (ref 70–99)
Potassium: 3.3 mmol/L — ABNORMAL LOW (ref 3.5–5.1)
SODIUM: 143 mmol/L (ref 135–145)
Total Bilirubin: 0.7 mg/dL (ref 0.3–1.2)
Total Protein: 6.1 g/dL — ABNORMAL LOW (ref 6.5–8.1)

## 2017-09-17 LAB — CUP PACEART REMOTE DEVICE CHECK
Battery Remaining Longevity: 68 mo
Battery Remaining Percentage: 65 %
Brady Statistic AP VP Percent: 4.4 %
Brady Statistic AP VS Percent: 88 %
Brady Statistic RA Percent Paced: 58 %
Brady Statistic RV Percent Paced: 7.7 %
Implantable Lead Location: 753860
Lead Channel Impedance Value: 360 Ohm
Lead Channel Pacing Threshold Pulse Width: 0.4 ms
Lead Channel Sensing Intrinsic Amplitude: 1.4 mV
Lead Channel Setting Sensing Sensitivity: 2 mV
MDC IDC LEAD IMPLANT DT: 20080617
MDC IDC LEAD IMPLANT DT: 20080617
MDC IDC LEAD LOCATION: 753859
MDC IDC MSMT BATTERY VOLTAGE: 2.9 V
MDC IDC MSMT LEADCHNL RA PACING THRESHOLD AMPLITUDE: 0.5 V
MDC IDC MSMT LEADCHNL RV IMPEDANCE VALUE: 690 Ohm
MDC IDC MSMT LEADCHNL RV PACING THRESHOLD AMPLITUDE: 2 V
MDC IDC MSMT LEADCHNL RV PACING THRESHOLD PULSEWIDTH: 1 ms
MDC IDC MSMT LEADCHNL RV SENSING INTR AMPL: 12 mV
MDC IDC PG IMPLANT DT: 20131125
MDC IDC PG SERIAL: 7368549
MDC IDC SESS DTM: 20190628002312
MDC IDC SET LEADCHNL RA PACING AMPLITUDE: 1.5 V
MDC IDC SET LEADCHNL RV PACING AMPLITUDE: 3 V
MDC IDC SET LEADCHNL RV PACING PULSEWIDTH: 0.7 ms
MDC IDC STAT BRADY AS VP PERCENT: 1 %
MDC IDC STAT BRADY AS VS PERCENT: 7.3 %

## 2017-09-17 LAB — MAGNESIUM: Magnesium: 1.8 mg/dL (ref 1.7–2.4)

## 2017-09-17 LAB — PROTEIN ELECTROPHORESIS, SERUM
A/G Ratio: 0.8 (ref 0.7–1.7)
ALBUMIN ELP: 3 g/dL (ref 2.9–4.4)
ALPHA-1-GLOBULIN: 0.3 g/dL (ref 0.0–0.4)
ALPHA-2-GLOBULIN: 1 g/dL (ref 0.4–1.0)
Beta Globulin: 1 g/dL (ref 0.7–1.3)
Gamma Globulin: 1.3 g/dL (ref 0.4–1.8)
Globulin, Total: 3.6 g/dL (ref 2.2–3.9)
M-SPIKE, %: 0.8 g/dL — AB
Total Protein ELP: 6.6 g/dL (ref 6.0–8.5)

## 2017-09-17 LAB — CBC WITH DIFFERENTIAL/PLATELET
ABS IMMATURE GRANULOCYTES: 0.1 10*3/uL (ref 0.0–0.1)
Basophils Absolute: 0 10*3/uL (ref 0.0–0.1)
Basophils Relative: 0 %
EOS ABS: 0.4 10*3/uL (ref 0.0–0.7)
Eosinophils Relative: 3 %
HEMATOCRIT: 39 % (ref 36.0–46.0)
HEMOGLOBIN: 12.6 g/dL (ref 12.0–15.0)
Immature Granulocytes: 1 %
LYMPHS ABS: 1.2 10*3/uL (ref 0.7–4.0)
LYMPHS PCT: 9 %
MCH: 32.6 pg (ref 26.0–34.0)
MCHC: 32.3 g/dL (ref 30.0–36.0)
MCV: 101 fL — ABNORMAL HIGH (ref 78.0–100.0)
MONOS PCT: 8 %
Monocytes Absolute: 1.1 10*3/uL — ABNORMAL HIGH (ref 0.1–1.0)
NEUTROS PCT: 79 %
Neutro Abs: 10 10*3/uL — ABNORMAL HIGH (ref 1.7–7.7)
Platelets: 245 10*3/uL (ref 150–400)
RBC: 3.86 MIL/uL — AB (ref 3.87–5.11)
RDW: 13.9 % (ref 11.5–15.5)
WBC: 12.6 10*3/uL — ABNORMAL HIGH (ref 4.0–10.5)

## 2017-09-17 LAB — PHOSPHORUS: PHOSPHORUS: 2.1 mg/dL — AB (ref 2.5–4.6)

## 2017-09-17 MED ORDER — K PHOS MONO-SOD PHOS DI & MONO 155-852-130 MG PO TABS
500.0000 mg | ORAL_TABLET | Freq: Once | ORAL | Status: AC
  Administered 2017-09-17: 500 mg via ORAL
  Filled 2017-09-17: qty 2

## 2017-09-17 MED ORDER — POTASSIUM CHLORIDE 20 MEQ PO PACK
40.0000 meq | PACK | Freq: Two times a day (BID) | ORAL | Status: AC
  Administered 2017-09-17 (×2): 40 meq via ORAL
  Filled 2017-09-17 (×2): qty 2

## 2017-09-17 NOTE — Progress Notes (Signed)
PROGRESS NOTE    Heather Jarvis  TIW:[id number] DOB: 24-Aug-1928 DOA: 09/11/2017 PCP: Eulas Post, MD  Brief Narrative: Heather Jarvis  is a 84 y.o. female, 84 year old female with past medical history significant for advanced dementia, hypertension, atrial fibrillation with history of sick sinus syndrome status post pacemaker who was brought by her family today because of increased lethargy and worsening mental status .  The family was in the process of placing her in the nursing home in [place] .  Patient is confused and cannot give any history.  She lives with her son who is not able to take care of her anymore.  In the emergency room , work-up showed leukocytosis with ? congestive heart failure, mild renal insufficiency and suspected UTI. Found to have severe electrolyte abnormalities that are slowly being corrected.  This morning the patient went into acute respiratory distress and was found to be volume overloaded likely aspirated. IV fluids were stopped.  She was given IV Lasix, Solu-Medrol, and a stat portable chest x-[place], started on Xopenex/Atrovent, and Foley catheter placed.  She is transferred to telemetry and SLP was done who recommended n.p.o. But today she was upgraded to Dysphagia 2 Diet. Family states she is improving slightly but keeps becoming agitated and combative with sundowning. Has received 2.5 mg of Haldol x2 while hospitalized.  Assessment & Plan:   Active Problems:   Cardiac pacemaker in situ   Dementia   Hypokalemia   Hypercalcemia   CKD (chronic kidney disease), stage III (HCC)   AF (paroxysmal atrial fibrillation) (HCC)   UTI (urinary tract infection)   Hypomagnesemia   Suspected UTI   Acute respiratory failure with hypoxia (HCC)   Aspiration into airway   Acute diastolic CHF (congestive heart failure) (HCC)  Acute Respiratory Failure with Hypoxia in the setting of Diastolic CHF Exacerbation along with likely Aspiration PNA, improving  -Patient had increased  work of Breathing and had to be placed on Supplemental O2 via [place]; Breathing has significantly improved and appeared to have unlabored breathing today  -CXR this AM showed Persistent patchy consolidation left base with small pleural effusions bilaterally. Cardiomegaly. Pulmonary vascularity normal. There is aortic atherosclerosis. Pacemaker leads attached to right atrium and right ventricle. Aortic Atherosclerosis. -Started Xopenex/Atrovent Scheduled and c/w DuoNeb 3 mL q6hprn Wheezing/SOB and will continue  -Started Methylprednisolone 40 mg IV q12h but will reduce to 40 mg Daily  -ECHOCardiogram showed The Left Ventricle cavity size was normal. Wall thickness was increased in a pattern of moderate LVH. Systolic function was  normal. The estimated ejection fraction was in the range of 60%  to 65%. The study is not technically sufficient to allow  evaluation of LV diastolic function. -Stopped IVF hydration and Started Diuresis with IV Furosemide 40 mg BID but now stopped due to worsening Renal Function; -Flutter Valve and Incentive Spirometry  -Aspiration Precautions -Changed IV Ceftriaxone to IV Zosyn given concern of Aspiration with Pharmacy to adjust dosing   -SLP Evaluate and Treated and recommended NPO but will change to Dysphagia 2 Diet  -Repeat CXR in AM  Acute Decompensation of Diastolic CHF -As above -Diuresis with IV Lasix 40 mg BID now stopped after Lasix dose this AM -Strict I's/O's and Daily Weights -Patient is now +2962 Liters since Admission -Checked BNP and was 831.8 -ECHO as below -Repeat CXR as above  -Placed Foley Catheter for Aggressive Diuresis and will need to wean and do TOV  -Continue to Monitor volume status carefully -C/w Telemetry   Bilateral  Pulmonary Edema, stable now -Seen on Admission CXR -Appeared Clinically Dry on admission so was rehydrated  -ECHO Done and as below and showed EF of 60-65% -TSH was normal at 3.043 -Repeat CXR this AM as above  -Treatment  as above  Acute Encephalopathy; Multifactorial but in the setting of Worsening/Advanced Dementia, Hypercalcemia, and UTI, SunDowning   -Head CT w/o Contrast done and showed Chronic atrophic and ischemic changes stable from the prior exam. No acute abnormality is noted. -Delirium Precautions -Family stated Mentation is improving and that she "plays possum" and has "behavioral issues."  -UTI being treated and Calcium trending down and is no 9.1 -Had to be given 2.5 mg Haldol for Severe Agitation during hospitalization and again night of 7/8-7/9 and she was extremely somnolent this AM.    Suspected UTI -WBC on Admission was 13.6 and trended down to 11.6 but now trended up now and is going from 15.6 -> 20.3 -> 12.6 -Patient's urinalysis showed a cloudy appearance with trace leukocytes, negative nitrites, no bacteria seen, 6-10 WBCs, -Urine culture sent and showed multiple species and suggested recollection we will send another urine culture -Continued with empiric antibiotics started on admission with IV Ceftriaxone but will now changed to IV Zosyn for concern of Aspiration Pneumona  Hypokalemia -Patient's K+ was 3.3 this AM and dropped likely from diuresis -Replete -Continue to Monitor and Replete as Necessary -Repeat CMP in AM   Hypercalcemia, improved  -Patient's Ca2+ worsened and went from 12.6 -> 12.8 but now improving and is 8.0 -Started gentle IVF resucitation with NaCl + 40 mEQ of KCl at a rate of 50 mL/hr and changed to D5W but now stopped due to volume overload . -Checked  PTH 20, PTHrP pending, 25 Hydroxy Vitamin D was 52.6, 1,25 Vitamin D ordered and pending; Will check SPEP and UPEP and pending  -Continue to Monitor and repeat CMP in AM   Hypomagnesemia -Patient's Mag Level was 1.4 and improved to 1.8 -Continue to Monitor and Replete as Necessary -Repeat Mag Level in AM   Hypophosphatemia -Patient's Phos Level was 2.1 -Replete  -Continue to Monitor and Replete as  Necessary -Repeat CMP In AM   Hypernatremia, improved -Patient's Na+ was worsening and went from 142 -> 149 but now improved to 143 -Stopped IVF as above and given Lasix but no lasix has stopped   -Repeat CMP in AM   Dehydration and Renal Insuffiencey in the setting of CKD Stage 3 -Patient's BUN/Cr on Admission was 28/1.69 and improved to 23/1.33 but now again worsened after IV Diuresis and Zosyn and is now 40/1.67 -Na+ was 143 and Ca2+ wass 8.0 -Started gentle Hydration with NS + 40 mEQ KCl at 50 mL/hr and changed to D5W + 20 mEQ KCl but now will stopped IVF altogether  -Will discontinue IV Lasix after this AM's dose  -Continue to Monitor and repeat CMP in AM   Atrial Fibrillation with Hx of SSS s/p Pacer -Held Apixaban 5 mg po BID given NPO and started Heparin gtt but ok to resume Apixaban now -Placed on Telemetry  -Not on any rate control meds at Home -C/w IV Metoprolol 5 mg q6hprn for HR>130 -Continues to be Atrial Fibrillation with Tachycardic Rates   DVT prophylaxis: Anticoagulated with Apixaban Code Status: DO NOT RESUSCITATE  Family Communication: No family present at bedside  Disposition Plan: SNF if patient's family agreeable when patient is medically stable to D/C   Consultants:   None   Procedures:  ECHOCARDIOGRAM ------------------------------------------------------------------- Study Conclusions  -  Left ventricle: The cavity size was normal. Wall thickness was   increased in a pattern of moderate LVH. Systolic function was   normal. The estimated ejection fraction was in the range of 60%   to 65%. The study is not technically sufficient to allow   evaluation of LV diastolic function. - Aortic valve: Mildly to moderately calcified annulus. Trileaflet;   moderately thickened leaflets. There was mild to moderate   stenosis. There was mild regurgitation. Mean gradient (S): 13 mm   Hg. Valve area (VTI): 1.01 cm^2. Valve area (Vmax): 1.02 cm^2.   Valve area  (Vmean): 1.02 cm^2. - Mitral valve: Moderately calcified annulus. Mildly thickened   leaflets . There was mild regurgitation. - Left atrium: The atrium was severely dilated. - Right atrium: The atrium was mildly dilated. - Atrial septum: No defect or patent foramen ovale was identified. - Technically adequate study.   Antimicrobials:  Anti-infectives (From admission, onward)   Start     Dose/Rate Route Frequency Ordered Stop   09/14/17 1200  piperacillin-tazobactam (ZOSYN) IVPB 3.375 g     3.375 g 12.5 mL/hr over 240 Minutes Intravenous Every 8 hours 09/14/17 1034     09/11/17 2200  cefTRIAXone (ROCEPHIN) 1 g in sodium chloride 0.9 % 100 mL IVPB  Status:  Discontinued     1 g 200 mL/hr over 30 Minutes Intravenous Every 24 hours 09/11/17 2107 09/14/17 1018     Subjective: Seen and examined at bedside and was calm today.  Nursing states she was agitated last night but improved.  Denied any complaints and states short of breath was okay.  She did not have increased work of breathing today. Patient felt okay denies any pain.  Objective: Vitals:   09/17/17 0945 09/17/17 1553 09/17/17 1617 09/17/17 1636  BP: (!) 144/93 (!) 130/114  131/70  Pulse: 91 73 (!) 135 70  Resp: 18   20  Temp: 98.1 F (36.7 C)   98.4 F (36.9 C)  TempSrc: Axillary   Oral  SpO2: 99% 98%  92%  Weight:      Height:        Intake/Output Summary (Last 24 hours) at 09/17/2017 1649 Last data filed at 09/17/2017 0900 Gross per 24 hour  Intake 1260 ml  Output 975 ml  Net 285 ml   Filed Weights   09/11/17 1443 09/12/17 2201 09/14/17 2041  Weight: 63.5 kg (140 lb) 64.9 kg (143 lb) 67.1 kg (148 lb)   Examination: Physical Exam:  Constitutional: Well-nourished, well-developed Caucasian female is currently in no acute distress and is pleasantly confused and calm at this time. Eyes: Sclera anicteric.  Has arcus senilis. ENMT: External ears and nose appear normal.  She is slightly hard of hearing Neck: Neck is  supple and no JVD appreciated Respiratory: Diminished to auscultation bilaterally with no appreciable wheezing but does have some slight rhonchi.  Patient is wearing supplemental oxygen via nasal cannula but has unlabored breathing and is not as tachypneic as yesterday Cardiovascular: Elderly irregular and tachycardic.  No appreciable murmurs, rubs, gallops.  Has trace to 1+ lower extremity edema Abdomen: Soft, nontender, nondistended.  Bowel sounds present in 4 quadrants GU: Has a indwelling Foley catheter in place Musculoskeletal: No contractures or cyanosis.  No joint deformities noted Skin: Skin is warm and dry with no appreciable rashes or lesions on limited skin evaluation Neurologic: Cranial nerves II through XII grossly intact no appreciable focal deficits Psychiatric: Impaired judgment and insight.  Patient is awake and pleasant  today not combative.  She still wants to sleep but not as somnolent as yesterday  Data Reviewed: I have personally reviewed following labs and imaging studies  CBC: Recent Labs  Lab 09/11/17 1501 09/12/17 0204 09/13/17 0819 09/14/17 0521 09/15/17 0656 09/17/17 0359  WBC 13.6* 11.5* 11.6* 15.6* 20.3* 12.6*  NEUTROABS 10.4*  --  8.3* 11.7* 19.0* 10.0*  HGB 13.9 13.9 12.4 13.0 13.2 12.6  HCT 41.8 41.3 37.8 39.8 39.4 39.0  MCV 99.5 100.0 100.5* 102.1* 100.5* 101.0*  PLT 251 288 255 266 258 625   Basic Metabolic Panel: Recent Labs  Lab 09/12/17 0204 09/13/17 0819 09/14/17 0521 09/15/17 0656 09/17/17 0359  NA 146* 149* 145 148* 143  K <2.0* 3.0* 4.0 3.0* 3.3*  CL 98 109 111 104 106  CO2 36* 30 23 28 26   GLUCOSE 70 106* 130* 147* 92  BUN 29* 23 25* 27* 40*  CREATININE 1.68* 1.33* 1.19* 1.70* 1.67*  CALCIUM 12.8* 10.6*  10.8* 9.4 9.1 8.0*  MG 1.5* 1.9 1.6* 1.4* 1.8  PHOS 2.7 1.9* 4.6 3.1 2.1*   GFR: Estimated Creatinine Clearance: 20.8 mL/min (A) (by C-G formula based on SCr of 1.67 mg/dL (H)). Liver Function Tests: Recent Labs  Lab  09/12/17 0204 09/13/17 0819 09/14/17 0521 09/15/17 0656 09/17/17 0359  AST 27 23 30 27 17   ALT 17 16 23 25 22   ALKPHOS 64 62 66 65 59  BILITOT 1.0 0.7 0.7 1.2 0.7  PROT 7.0 6.2* 6.3* 6.7 6.1*  ALBUMIN 3.5 3.2* 3.0* 3.1* 2.7*   No results for input(s): LIPASE, AMYLASE in the last 168 hours. No results for input(s): AMMONIA in the last 168 hours. Coagulation Profile: No results for input(s): INR, PROTIME in the last 168 hours. Cardiac Enzymes: No results for input(s): CKTOTAL, CKMB, CKMBINDEX, TROPONINI in the last 168 hours. BNP (last 3 results) No results for input(s): PROBNP in the last 8760 hours. HbA1C: No results for input(s): HGBA1C in the last 72 hours. CBG: No results for input(s): GLUCAP in the last 168 hours. Lipid Profile: No results for input(s): CHOL, HDL, LDLCALC, TRIG, CHOLHDL, LDLDIRECT in the last 72 hours. Thyroid Function Tests: No results for input(s): TSH, T4TOTAL, FREET4, T3FREE, THYROIDAB in the last 72 hours. Anemia Panel: No results for input(s): VITAMINB12, FOLATE, FERRITIN, TIBC, IRON, RETICCTPCT in the last 72 hours. Sepsis Labs: No results for input(s): PROCALCITON, LATICACIDVEN in the last 168 hours.  Recent Results (from the past 240 hour(s))  Urine culture     Status: Abnormal   Collection Time: 09/11/17  5:09 PM  Result Value Ref Range Status   Specimen Description URINE, CLEAN CATCH  Final   Special Requests   Final    NONE Performed at [facility], [address] [place], [place] [address]    Culture MULTIPLE SPECIES PRESENT, SUGGEST RECOLLECTION (A)  Final   Report Status 09/12/2017 FINAL  Final  Culture, Urine     Status: None   Collection Time: 09/14/17  7:54 AM  Result Value Ref Range Status   Specimen Description URINE, RANDOM  Final   Special Requests NONE  Final   Culture   Final    NO GROWTH Performed at [facility], [place] [address], [place], [place] [address]    Report Status 09/15/2017 FINAL  Final      Radiology Studies: Dg Chest Port 1 View  Result Date: 09/16/2017 CLINICAL DATA:  Shortness of breath EXAM: PORTABLE CHEST 1 VIEW COMPARISON:  September 15, 2017 FINDINGS: There are small pleural effusions bilaterally. There is patchy consolidation in the left base. There is cardiomegaly with pulmonary vascularity normal. Pacemaker leads are attached to the right atrium and right ventricle. No adenopathy. There is aortic atherosclerosis. No evident bone lesions. IMPRESSION: Persistent patchy consolidation left base with small pleural effusions bilaterally. Cardiomegaly. Pulmonary vascularity normal. There is aortic atherosclerosis. Pacemaker leads attached to right atrium and right ventricle. Aortic Atherosclerosis (ICD10-I70.0). Electronically Signed   By: Lowella Grip III M.D.   On: 09/16/2017 07:28   Scheduled Meds: . apixaban  2.5 mg Oral BID  . methylPREDNISolone (SOLU-MEDROL) injection  40 mg Intravenous Daily  . potassium chloride  40 mEq Oral BID  . sodium chloride flush  3 mL Intravenous Q12H   Continuous Infusions: . sodium chloride 250 mL (09/11/17 2233)  . piperacillin-tazobactam (ZOSYN)  IV 3.375 g (09/17/17 1347)    LOS: 6 days    Kerney Elbe, DO [facility] Pager [phone]  If 7PM-7AM, please contact night-coverage [email or web address] Password [facility] 09/17/2017, 4:49 PM

## 2017-09-17 NOTE — Progress Notes (Signed)
Pharmacy Antibiotic Note  Heather Jarvis is a 84 y.o. female admitted on 09/11/2017 with pneumonia.  Pharmacy has been consulted for Zosyn dosing. MD concerned that patient aspirated. CXR and repeat CXR shows small bilateral pleural effusions and pulmonary edema. Previously treated with ceftriaxone for UTI, which was discontinued. WBC trending down: 12.6 today. Patient afebrile with stable SCr.   Plan: Zosyn 3.375g IV q8h (4 hour infusion).  Day 4 of antibiotics for aspiration pneumonia, guidelines recommend treatment for 5-7 days.   Height: 5\' 1"  (154.9 cm) Weight: 148 lb (67.1 kg) IBW/kg (Calculated) : 47.8  Temp (24hrs), Avg:98.1 F (36.7 C), Min:98 F (36.7 C), Max:98.2 F (36.8 C)  Recent Labs  Lab 09/12/17 0204 09/13/17 0819 09/14/17 0521 09/15/17 0656 09/17/17 0359  WBC 11.5* 11.6* 15.6* 20.3* 12.6*  CREATININE 1.68* 1.33* 1.19* 1.70* 1.67*    Estimated Creatinine Clearance: 20.8 mL/min (A) (by C-G formula based on SCr of 1.67 mg/dL (H)).    No Known Allergies  Antimicrobials this admission: Ceftriaxone 7/3 >> 7/5 Zosyn 7/6 >>  Microbiology results: 7/3 UCx: multiple species  7/7 UCx: negative   Thank you for allowing pharmacy to be a part of this patient's care.  ANNA K LOVE 09/17/2017 9:51 AM

## 2017-09-18 ENCOUNTER — Inpatient Hospital Stay (HOSPITAL_COMMUNITY): Payer: Medicare HMO

## 2017-09-18 DIAGNOSIS — E8770 Fluid overload, unspecified: Secondary | ICD-10-CM

## 2017-09-18 DIAGNOSIS — G9340 Encephalopathy, unspecified: Secondary | ICD-10-CM

## 2017-09-18 DIAGNOSIS — G309 Alzheimer's disease, unspecified: Secondary | ICD-10-CM

## 2017-09-18 DIAGNOSIS — J690 Pneumonitis due to inhalation of food and vomit: Secondary | ICD-10-CM

## 2017-09-18 DIAGNOSIS — R1310 Dysphagia, unspecified: Secondary | ICD-10-CM

## 2017-09-18 DIAGNOSIS — F0281 Dementia in other diseases classified elsewhere with behavioral disturbance: Secondary | ICD-10-CM

## 2017-09-18 DIAGNOSIS — N179 Acute kidney failure, unspecified: Secondary | ICD-10-CM

## 2017-09-18 DIAGNOSIS — N183 Chronic kidney disease, stage 3 (moderate): Secondary | ICD-10-CM

## 2017-09-18 LAB — CBC WITH DIFFERENTIAL/PLATELET
ABS IMMATURE GRANULOCYTES: 0.1 10*3/uL (ref 0.0–0.1)
Basophils Absolute: 0 10*3/uL (ref 0.0–0.1)
Basophils Relative: 0 %
EOS ABS: 0.3 10*3/uL (ref 0.0–0.7)
Eosinophils Relative: 2 %
HCT: 38.9 % (ref 36.0–46.0)
HEMOGLOBIN: 12.6 g/dL (ref 12.0–15.0)
Immature Granulocytes: 1 %
LYMPHS ABS: 2 10*3/uL (ref 0.7–4.0)
LYMPHS PCT: 14 %
MCH: 33.2 pg (ref 26.0–34.0)
MCHC: 32.4 g/dL (ref 30.0–36.0)
MCV: 102.4 fL — AB (ref 78.0–100.0)
MONOS PCT: 7 %
Monocytes Absolute: 1 10*3/uL (ref 0.1–1.0)
NEUTROS ABS: 10.5 10*3/uL — AB (ref 1.7–7.7)
NEUTROS PCT: 76 %
Platelets: 264 10*3/uL (ref 150–400)
RBC: 3.8 MIL/uL — AB (ref 3.87–5.11)
RDW: 13.7 % (ref 11.5–15.5)
WBC: 13.9 10*3/uL — AB (ref 4.0–10.5)

## 2017-09-18 LAB — COMPREHENSIVE METABOLIC PANEL
ALBUMIN: 2.9 g/dL — AB (ref 3.5–5.0)
ALK PHOS: 58 U/L (ref 38–126)
ALT: 23 U/L (ref 0–44)
AST: 17 U/L (ref 15–41)
Anion gap: 13 (ref 5–15)
BILIRUBIN TOTAL: 0.7 mg/dL (ref 0.3–1.2)
BUN: 40 mg/dL — AB (ref 8–23)
CO2: 23 mmol/L (ref 22–32)
CREATININE: 1.56 mg/dL — AB (ref 0.44–1.00)
Calcium: 7.8 mg/dL — ABNORMAL LOW (ref 8.9–10.3)
Chloride: 107 mmol/L (ref 98–111)
GFR calc Af Amer: 33 mL/min — ABNORMAL LOW (ref >60)
GFR, EST NON AFRICAN AMERICAN: 29 mL/min — AB (ref >60)
GLUCOSE: 89 mg/dL (ref 70–99)
POTASSIUM: 3.8 mmol/L (ref 3.5–5.1)
Sodium: 143 mmol/L (ref 135–145)
TOTAL PROTEIN: 6.2 g/dL — AB (ref 6.5–8.1)

## 2017-09-18 LAB — MAGNESIUM: MAGNESIUM: 1.8 mg/dL (ref 1.7–2.4)

## 2017-09-18 LAB — PHOSPHORUS: Phosphorus: 1.8 mg/dL — ABNORMAL LOW (ref 2.5–4.6)

## 2017-09-18 MED ORDER — METOPROLOL TARTRATE 25 MG PO TABS
25.0000 mg | ORAL_TABLET | Freq: Two times a day (BID) | ORAL | Status: DC
  Administered 2017-09-18: 25 mg via ORAL
  Filled 2017-09-18 (×2): qty 1

## 2017-09-18 MED ORDER — DEXTROSE 5 % IV SOLN
30.0000 mmol | Freq: Once | INTRAVENOUS | Status: AC
  Administered 2017-09-18: 30 mmol via INTRAVENOUS
  Filled 2017-09-18: qty 10

## 2017-09-18 MED ORDER — PREDNISONE 20 MG PO TABS
40.0000 mg | ORAL_TABLET | Freq: Every day | ORAL | Status: AC
  Administered 2017-09-19: 40 mg via ORAL
  Filled 2017-09-18: qty 2

## 2017-09-18 NOTE — Progress Notes (Signed)
ANTICOAGULATION CONSULT NOTE - Follow Up Consult  Pharmacy Consult for apixaban Indication: atrial fibrillation  No Known Allergies  Patient Measurements: Height: 5\' 1"  (154.9 cm) Weight: 145 lb (65.8 kg) IBW/kg (Calculated) : 47.8   Vital Signs: Temp: 98.1 F (36.7 C) (07/10 0747) Temp Source: Oral (07/10 0557) BP: 144/99 (07/10 0747) Pulse Rate: 136 (07/10 0747)  Labs: Recent Labs    09/17/17 0359 09/18/17 0422  HGB 12.6 12.6  HCT 39.0 38.9  PLT 245 264  CREATININE 1.67* 1.56*    Estimated Creatinine Clearance: 22.1 mL/min (A) (by C-G formula based on SCr of 1.56 mg/dL (H)).  Assessment: 10 YOF with history of afib s/p pacemaker. Patient on apixaban 5mg  BID at home. Currently being treated for possible aspiration PNA. Currently on apixaban 2.5mg  BID inpatient, as pt is 89y and has had two episodes of SCr > 1.5 during this inpatient stay.  CBC stable    Plan:  Today Scr 1.56, will continue apixaban 2.5mg  BID.  Could consider increase in dose if renal function continues to improve in the future  Pharmacy will sign off and follow peripherally.  Janae Bridgeman, PharmD PGY1 Pharmacy Resident Phone: [phone] 09/18/2017 11:03 AM

## 2017-09-18 NOTE — Progress Notes (Signed)
SLP Cancellation Note  Patient Details Name: Heather Jarvis MRN: [id number] DOB: 02/13/1929   Cancelled treatment:       Reason Eval/Treat Not Completed: Patient declined, curled up in bed with eyes closed; adamant refusals despite encouragement.  Will continue efforts.   Juan Quam Laurice 09/18/2017, 11:16 AM

## 2017-09-18 NOTE — Progress Notes (Addendum)
PROGRESS NOTE  Heather Jarvis OVZ:[id number] DOB: 17-Sep-1928 DOA: 09/11/2017 PCP: Eulas Post, MD  Brief Narrative: 84 year old woman PMH advanced dementia, hypertension, atrial fibrillation, status post pacemaker for sick sinus syndrome, presented with increased lethargy and worsening mental status.  Family process of moving her to [place] to a nursing home.  Admitted for acute encephalopathy, UTI, dehydration, possible mild CHF.  Treated with empiric antibiotics, found to have multiple electrolyte derangements which were corrected.  Acute kidney injury was treated with IV fluids.  Patient subsequently developed acute respiratory distress, was found to be volume overloaded, aspirated.  Assessment/Plan Acute encephalopathy superimposed on advanced dementia, sundowning noted during hospitalization.  CT head negative. --suspect close to baseline, will follow, minimize sedatives.  Possible UTI. UTI was suspected on admission although urinalysis was equivocal and urine culture was unrevealing.  Treated empirically with ceftriaxone. --d/c foley today  Acute volume overload secondary to IVF, now +1.9 L since admission.  --appears euvolemic at this time. Expect spontaneous resolution --do not see evidence of diastolic CHF.  Possible aspiration pneumonia with acute hypoxic resp failure --appears resolved, no hypoxia, has completed course of abx today. Afebrile, VSS. Monitor off abx.   Hypercalcemia --Resolved spontaneously with IV fluids, given acute kidney injury, most likely cause was poor oral intake, dehydration.  No further evaluation suggested.  Hypomagnesemia, hypophosphatemia, hypokalemia --Mg, K+ WNL. Phos low, will replete  Acute kidney injury superimposed on CKD stage III, dehydration, superimposed on chronic kidney disease stage III --slowly improving, will trend BMP  Atrial fibrillation, permanent pacemaker, status post sick sinus syndrome --RVR today, will add metoprolol.  Continue apixaban  Dysphagia, continue diet per speech therapy.  Advanced dementia   Aortic atherosclerosis    Seems to be improving, will stop abx and observe, check BMP in AM. Anticipate d/c next 48 hours  DVT prophylaxis: apixaban Code Status: DNR Family Communication: none Disposition Plan: SNF    Murray Hodgkins, MD  [facility] Direct contact: [phone] --Via amion app OR  --[email or web address]; password [email or web address]  7PM-7AM contact night coverage as above 09/18/2017, 4:36 PM  LOS: 7 days   Consultants:  Procedures: Echo Study Conclusions  - Left ventricle: The cavity size was normal. Wall thickness was   increased in a pattern of moderate LVH. Systolic function was   normal. The estimated ejection fraction was in the range of 60%   to 65%. The study is not technically sufficient to allow   evaluation of LV diastolic function. - Aortic valve: Mildly to moderately calcified annulus. Trileaflet;   moderately thickened leaflets. There was mild to moderate   stenosis. There was mild regurgitation. Mean gradient (S): 13 mm   Hg. Valve area (VTI): 1.01 cm^2. Valve area (Vmax): 1.02 cm^2.   Valve area (Vmean): 1.02 cm^2. - Mitral valve: Moderately calcified annulus. Mildly thickened   leaflets . There was mild regurgitation. - Left atrium: The atrium was severely dilated. - Right atrium: The atrium was mildly dilated. - Atrial septum: No defect or patent foramen ovale was identified. - Technically adequate study.  Antimicrobials:  Zosyn 7/6 > 7/10  Ceftriaxone 7/3 > 7/5  Interval history/Subjective: Seems to feel ok. Hx limited by dementia RN reports confusion at night, no needs now.  Objective: Vitals:  Vitals:   09/18/17 0557 09/18/17 0747  BP: (!) 151/126 (!) 144/99  Pulse: 71 (!) 136  Resp: (!) 22 (!) 21  Temp: (!) 97.5 F (36.4 C) 98.1 F (36.7 C)  SpO2: 97%     Exam:  Constitutional:  . Appears calm and comfortable Respiratory:  . CTA  bilaterally, no w/r/r.  . Respiratory effort normal.  Cardiovascular:  . RRR, no m/r/g . No LE extremity edema   Psychiatric:  . Mental status o confused . judgment and insight impaired   I have personally reviewed the following:  +1.9 L since admission. Labs:  Creatinine trending down, BUN 40, creatinine 1.56.  Potassium within normal limits.  Magnesium within normal limits.  Phosphorus low, 1.8.  No significant change in WBC, 13.9.  Remainder CBC unremarkable.  Scheduled Meds: . apixaban  2.5 mg Oral BID  . [START ON 09/19/2017] predniSONE  40 mg Oral Q breakfast  . sodium chloride flush  3 mL Intravenous Q12H   Continuous Infusions: . sodium chloride 250 mL (09/11/17 2233)  . potassium PHOSPHATE IVPB (in mmol) 30 mmol (09/18/17 1618)    Active Problems:   Cardiac pacemaker in situ   Dementia   CKD (chronic kidney disease), stage III (HCC)   AF (paroxysmal atrial fibrillation) (HCC)   UTI (urinary tract infection)   Aspiration into airway   Acute encephalopathy   Volume overload   Aspiration pneumonia (HCC)   AKI (acute kidney injury) ([place])   Dysphagia   LOS: 7 days

## 2017-09-19 DIAGNOSIS — I480 Paroxysmal atrial fibrillation: Secondary | ICD-10-CM

## 2017-09-19 MED ORDER — LEVALBUTEROL HCL 0.63 MG/3ML IN NEBU: 0.6300 mg | INHALATION_SOLUTION | Freq: Four times a day (QID) | RESPIRATORY_TRACT | Status: DC | PRN

## 2017-09-19 MED ORDER — METOPROLOL TARTRATE 50 MG PO TABS
50.0000 mg | ORAL_TABLET | Freq: Two times a day (BID) | ORAL | Status: DC
  Administered 2017-09-19 – 2017-09-20 (×3): 50 mg via ORAL
  Filled 2017-09-19 (×3): qty 1

## 2017-09-19 NOTE — Progress Notes (Signed)
Occupational Therapy Treatment Patient Details Name: Heather Jarvis MRN: [id number] DOB: [birth year] Today's Date: 09/19/2017    History of present illness Pt is a 84 y.o. F with significant PMH for advanced dementia, hypertension, atrial fibrillation with history of sick sinus syndrome s/p pacemaker who presents with increased lethargy and worsening mental status, found to have UTI.   OT comments  This 84 yo female admitted with above making progress today with grooming and transfers. She was politely not wanting to get up, but was easily redirected when we explained her bed needed to be changed. She will continue to benefit from acute OT with follow up OT at SNF.  Follow Up Recommendations  SNF;Supervision/Assistance - 24 hour    Equipment Recommendations  None recommended by OT       Precautions / Restrictions Precautions Precautions: Fall Restrictions Weight Bearing Restrictions: No       Mobility Bed Mobility Overal bed mobility: Needs Assistance Bed Mobility: Sidelying to Sit;Rolling Rolling: Modified independent (Device/Increase time)(rolling from back onto right side) Sidelying to sit: Min assist       General bed mobility comments: min assist for progressing BLE's off of the bed; most likely difficulty following commands rather than actual strength deficit  Transfers Overall transfer level: Needs assistance Equipment used: 2 person hand held assist Transfers: Sit to/from Stand Sit to Stand: Min assist;+2 safety/equipment Stand pivot transfers: Min assist;+2 safety/equipment       General transfer comment: Min assist provided to steady from bed to chair    Balance Overall balance assessment: Needs assistance Sitting-balance support: Single extremity supported;Feet supported Sitting balance-Leahy Scale: Good Sitting balance - Comments: Able to statically sit EOB for ~5 minutes while PT washed her back and applied lotion   Standing balance support: Bilateral  upper extremity supported Standing balance-Leahy Scale: Poor Standing balance comment: reliant on external support                           ADL either performed or assessed with clinical judgement   ADL Overall ADL's : Needs assistance/impaired       Grooming Details (indicate cue type and reason): setup/S to wash face seated in recliner, able to use toothbrush to brush her gums seated in recliner with Mod A to coordinate taking water from cup and then spitting into basin while seated                 Toilet Transfer: Minimal assistance;+2 for physical assistance;Stand-pivot Toilet Transfer Details (indicate cue type and reason): bed>recliner going to pt's left                 Vision Baseline Vision/History: (unknown)            Cognition Arousal/Alertness: Awake/alert Behavior During Therapy: WFL for tasks assessed/performed Overall Cognitive Status: History of cognitive impairments - at baseline                                 General Comments: history of advanced dementia. able to follow simple commands inconsistently. States she is 84 y.o. (patient is 84 y.o.), but able to correctly name her son                    Pertinent Vitals/ Pain       Pain Assessment: Faces Faces Pain Scale: No hurt         Frequency  Min 2X/week        Progress Toward Goals  OT Goals(current goals can now be found in the care plan section)  Progress towards OT goals: Progressing toward goals     Plan Discharge plan remains appropriate    Co-evaluation    PT/OT/SLP Co-Evaluation/Treatment: (partial) Reason for Co-Treatment: Necessary to address cognition/behavior during functional activity;For patient/therapist safety;To address functional/ADL transfers   OT goals addressed during session: ADL's and self-care;Strengthening/ROM      AM-PAC PT "6 Clicks" Daily Activity     Outcome Measure   Help from another person eating meals?: A  Lot Help from another person taking care of personal grooming?: A Little Help from another person toileting, which includes using toliet, bedpan, or urinal?: A Lot Help from another person bathing (including washing, rinsing, drying)?: A Lot Help from another person to put on and taking off regular upper body clothing?: Total Help from another person to put on and taking off regular lower body clothing?: Total 6 Click Score: 11    End of Session Equipment Utilized During Treatment: Gait belt  OT Visit Diagnosis: Unsteadiness on feet (R26.81);Other abnormalities of gait and mobility (R26.89)   Activity Tolerance Patient tolerated treatment well   Patient Left in chair;with call bell/phone within reach;with chair alarm set           Time: [phone] OT Time Calculation (min): 20 min  Charges: OT General Charges $OT Visit: 1 Visit OT Treatments $Self Care/Home Management : 8-22 mins  [address], OTR/L [phone] 09/19/2017

## 2017-09-19 NOTE — Clinical Social Work Note (Signed)
CSW received a call from [employer], admissions director with [facility] in [place], [place] regarding Heather Jarvis. Patient's daughter-in-law had talked with CSW (on 7/5) regarding patient going to this facility at discharge from [place] rehab, and that this is where her mother-in-law and husband are from and lots of family lives there. Heather Jarvis requested clinicals on patient and was advised that CSW will consult with assistant director for approval to fax information and will get back with her. Heather Jarvis phone number is [phone] and her fax number is [phone]. Heather Jarvis clarified that the family was seeking LTC at their skilled facility, and was advised that [facility] will advise her as to whether clinicals can be faxed to her.  CSW contacted Heather Jarvis at [facility] and was advised that [facility] denied rehab request. Talked with daughter-in-law, Heather Jarvis (at hospital) regarding insurance denial and per daughter, patient's insurance will pay for her care at facility in [place]. Also discussed patient's transport to facility and the original plan was to transport Heather Jarvis by vehicle, however Heather Jarvis requested that cost estimate be obtained from [facility] ambulance service.  Call made to [facility] and received estimate of $7,300. Consulted with assistant director Heather Jarvis regarding transmitting clinicals to facility and was given the OK, pending final decision on how patient will be transported to facility in [place]. Call made to Heather Jarvis and message left regarding ambulance transport cost.    Call made to patient's MD regarding insurance denial and vehicle transport to facility in [place] and he does not see a problem from a medical perspective in transporting patient by car. CSW will follow-up with daughter-in-law regarding transport and Heather Jarvis with [facility] [place] on Friday, 7/12.  Heather Jarvis, MSW, LCSW Licensed Clinical Social Worker [place] [phone]

## 2017-09-19 NOTE — Consult Note (Signed)
[facility] CM Primary Care Navigator  09/19/2017  Heather Jarvis 01/24/1929 [id number]   Met withpatientand daughter in-law Heather Jarvis) at the bedside to identify possible discharge needs. Per chart review, patient presented with increased lethargy and worsening mental status thathad led to this admission. (acute encephalopathy, UTI, dehydration, possible mild congestive HF)  Daughterendorses Dr. Carolann Littler with [facility] [place] astheprimary care provider for patient.   [facility] is using [facility] [place] obtain medications withoutdifficulty.  Patient's daughter in-law has been managing her medications at home.  Patient's son Heather Jarvis) has been providing transportation to herdoctors'appointments.  According to daughter in-law, patient had lived with them (son and daughter in-law) for 11 years and had been her primary caregivers at home.  Anticipated discharge planisskilled nursing facility (SNF) per therapy recommendation. Patient's daughter in- law mentionedthat they are in the process of moving her to a nursing home in [place] (where most of patient's family live)- when she is medically improved.  Plan for long-term careplacement in a nursing home ([place]) is the plan, since she is now requiring higher level of care, more than they can provide.  No further health management needs identified at this point.   For additional questions please contact:  Edwena Felty A. Akyla Vavrek, BSN, RN-BC [facility] PRIMARY CARE Navigator Cell: [phone]

## 2017-09-19 NOTE — Progress Notes (Signed)
PROGRESS NOTE  Heather Jarvis DXI:[id number] DOB: 1928-09-13 DOA: 09/11/2017 PCP: Eulas Post, MD  Brief Narrative: 84 year old woman PMH advanced dementia, hypertension, atrial fibrillation, status post pacemaker for sick sinus syndrome, presented with increased lethargy and worsening mental status.  Family process of moving her to [place] to a nursing home.  Admitted for acute encephalopathy, UTI, dehydration, possible mild CHF.  Treated with empiric antibiotics, found to have multiple electrolyte derangements which were corrected.  Acute kidney injury was treated with IV fluids.  Patient subsequently developed acute respiratory distress, was found to be volume overloaded, aspirated.  Assessment/Plan Atrial fibrillation, permanent pacemaker, status post sick sinus syndrome --Heart rate remains elevated, will increase Lopressor dose.  Continue apixaban.  Acute encephalopathy superimposed on advanced dementia, sundowning noted during hospitalization.  CT head negative. --Appears to be at baseline at this point.  Possible UTI. UTI was suspected on admission although urinalysis was equivocal and urine culture was unrevealing.  Treated empirically with ceftriaxone. --No further evaluation suggested.  Acute volume overload secondary to IVF, now +1.9 L since admission.  --Appears euvolemic.  No evidence of diastolic failure charted.  Possible aspiration pneumonia with acute hypoxic resp failure --Resolved.  No hypoxia.  Monitor clinically.  Hypercalcemia --Resolved spontaneously with IV fluids, given acute kidney injury, most likely cause was poor oral intake, dehydration.  No further evaluation suggested.  Hypomagnesemia, hypophosphatemia, hypokalemia --Mg, K+ WNL. Phos repleted yesterday.  Acute kidney injury superimposed on CKD stage III, dehydration, superimposed on chronic kidney disease stage III --Refused labs today but has been trending down.  Will follow clinically.  Dysphagia,  continue diet per speech therapy.  Advanced dementia  --Refuses labs at times.  Sundown's at night.  Aortic atherosclerosis    Overall much improved.  Only issue at this point is rapid heart rate which is fluctuating someone.  Will increase metoprolol monitor.  Anticipate discharge next 24 hours if heart rate controlled.  DVT prophylaxis: apixaban Code Status: DNR Family Communication: I tried to reach family at contact number listed, no answer. Disposition Plan: SNF    Murray Hodgkins, MD  [facility] Direct contact: [phone] --Via amion app OR  --[email or web address]; password [email or web address]  7PM-7AM contact night coverage as above 09/19/2017, 10:12 AM  LOS: 8 days   Consultants:  Procedures: Echo Study Conclusions  - Left ventricle: The cavity size was normal. Wall thickness was   increased in a pattern of moderate LVH. Systolic function was   normal. The estimated ejection fraction was in the range of 60%   to 65%. The study is not technically sufficient to allow   evaluation of LV diastolic function. - Aortic valve: Mildly to moderately calcified annulus. Trileaflet;   moderately thickened leaflets. There was mild to moderate   stenosis. There was mild regurgitation. Mean gradient (S): 13 mm   Hg. Valve area (VTI): 1.01 cm^2. Valve area (Vmax): 1.02 cm^2.   Valve area (Vmean): 1.02 cm^2. - Mitral valve: Moderately calcified annulus. Mildly thickened   leaflets . There was mild regurgitation. - Left atrium: The atrium was severely dilated. - Right atrium: The atrium was mildly dilated. - Atrial septum: No defect or patent foramen ovale was identified. - Technically adequate study.  Antimicrobials:  Zosyn 7/6 > 7/10  Ceftriaxone 7/3 > 7/5  Interval history/Subjective: Refused lab draw this AM and pulled out IV. Afebrile, HR up to 130s. No hypoxia. Pt feels fine, no complaints.  Objective: Vitals:  Vitals:   09/19/17 0525 09/19/17 0800  BP: (!) 140/111 Marland Kitchen)  156/120  Pulse: (!) 103 (!) 121  Resp: 18   Temp: 97.7 F (36.5 C) 97.6 F (36.4 C)  SpO2: 95% 98%    Exam: Constitutional:   . Appears calm and comfortable sitting in chair Respiratory:  . CTA bilaterally, few crackles, no w/r. Marland Kitchen Respiratory effort normal.  Cardiovascular:  . RRR, 2/6 systolic murmur, no m/r/g . No LE extremity edema   Musculoskeletal:  . RLE, LLE   . Moves both legs to command Psychiatric:  . Mental status o Mood, affect appropriate  I have personally reviewed the following:  +1.6 L since admission. UOP 800  Labs:    Scheduled Meds: . apixaban  2.5 mg Oral BID  . metoprolol tartrate  50 mg Oral BID  . sodium chloride flush  3 mL Intravenous Q12H   Continuous Infusions: . sodium chloride 250 mL (09/11/17 2233)    Active Problems:   Cardiac pacemaker in situ   Dementia   CKD (chronic kidney disease), stage III (HCC)   AF (paroxysmal atrial fibrillation) (HCC)   UTI (urinary tract infection)   Aspiration into airway   Acute encephalopathy   Volume overload   Aspiration pneumonia (HCC)   AKI (acute kidney injury) ([place])   Dysphagia   LOS: 8 days

## 2017-09-19 NOTE — Progress Notes (Signed)
Spoke with Nichola RN re PIV.  Pt pulled out PIV, no iv meds ordered other than PRN dose.  RN to contact MD re leaving PIV out. RN to place IV Team consult if PIV still needed.

## 2017-09-19 NOTE — Progress Notes (Signed)
Patient wouldn't allow lab to draw 5am labs. I attempted to encourage patient to allow the draw. She continue to refuse.

## 2017-09-19 NOTE — Plan of Care (Signed)
Patient is incontinent. A purewick was placed to prevent skin breakdown. Will continue to assure patient's safety.

## 2017-09-19 NOTE — Progress Notes (Signed)
Physical Therapy Treatment Patient Details Name: Heather Jarvis MRN: [id number] DOB: Jun 18, 1928 Today's Date: 09/19/2017    History of Present Illness Pt is a 84 y.o. F with significant PMH for advanced dementia, hypertension, atrial fibrillation with history of sick sinus syndrome s/p pacemaker who presents with increased lethargy and worsening mental status, found to have UTI.    PT Comments    Patient maintaining similar functional mobility level. Progressing bed to chair with two person minimal assistance using hand held guidance. Needs increased encouragement to participate but is overall very pleasant. D/c plan remains appropriate.              Follow Up Recommendations  SNF;Supervision/Assistance - 24 hour     Equipment Recommendations  Other (comment)(defer to SNF)    Recommendations for Other Services       Precautions / Restrictions Precautions Precautions: Fall Restrictions Weight Bearing Restrictions: No    Mobility  Bed Mobility Overal bed mobility: Needs Assistance Bed Mobility: Sidelying to Sit;Rolling Rolling: Modified independent (Device/Increase time)(rolling from back onto right side) Sidelying to sit: Min assist       General bed mobility comments: min assist for progressing BLE's off of the bed; most likely difficulty following commands rather than actual strength deficit  Transfers Overall transfer level: Needs assistance Equipment used: 2 person hand held assist Transfers: Sit to/from Stand Sit to Stand: Min assist;+2 safety/equipment Stand pivot transfers: Min assist;+2 safety/equipment       General transfer comment: Min assist provided to steady from bed to chair  Ambulation/Gait                 Stairs             Wheelchair Mobility    Modified Rankin (Stroke Patients Only)       Balance Overall balance assessment: Needs assistance Sitting-balance support: Single extremity supported;Feet supported Sitting  balance-Leahy Scale: Good Sitting balance - Comments: Able to statically sit EOB for ~5 minutes while PT washed her back and applied lotion   Standing balance support: Bilateral upper extremity supported Standing balance-Leahy Scale: Poor Standing balance comment: reliant on external support                            Cognition Arousal/Alertness: Awake/alert Behavior During Therapy: WFL for tasks assessed/performed Overall Cognitive Status: History of cognitive impairments - at baseline                                 General Comments: history of advanced dementia. able to follow simple commands inconsistently. States she is 84 y.o. (patient is 84 y.o.), but able to correctly name her son       Exercises      General Comments        Pertinent Vitals/Pain Pain Assessment: Faces Faces Pain Scale: No hurt    Home Living                      Prior Function            PT Goals (current goals can now be found in the care plan section) Acute Rehab PT Goals Patient Stated Goal: to lay down PT Goal Formulation: Patient unable to participate in goal setting    Frequency    Min 2X/week      PT Plan Current plan remains appropriate  Co-evaluation PT/OT/SLP Co-Evaluation/Treatment: Yes Reason for Co-Treatment: Necessary to address cognition/behavior during functional activity;For patient/therapist safety;To address functional/ADL transfers          AM-PAC PT "6 Clicks" Daily Activity  Outcome Measure  Difficulty turning over in bed (including adjusting bedclothes, sheets and blankets)?: None Difficulty moving from lying on back to sitting on the side of the bed? : Unable Difficulty sitting down on and standing up from a chair with arms (e.g., wheelchair, bedside commode, etc,.)?: Unable Help needed moving to and from a bed to chair (including a wheelchair)?: A Lot Help needed walking in hospital room?: A Lot Help needed climbing  3-5 steps with a railing? : Total 6 Click Score: 11    End of Session Equipment Utilized During Treatment: Gait belt Activity Tolerance: Patient tolerated treatment well Patient left: with call bell/phone within reach;in chair;with chair alarm set Nurse Communication: Mobility status PT Visit Diagnosis: Unsteadiness on feet (R26.81);Muscle weakness (generalized) (M62.81);Difficulty in walking, not elsewhere classified (R26.2)     Time: 8466-5993 PT Time Calculation (min) (ACUTE ONLY): 23 min  Charges:  $Therapeutic Activity: 8-22 mins                    G Codes:     Heather Jarvis, PT, DPT Acute Rehabilitation Services  Pager: [phone]    Heather Jarvis 09/19/2017, 1:01 PM

## 2017-09-20 DIAGNOSIS — N390 Urinary tract infection, site not specified: Secondary | ICD-10-CM

## 2017-09-20 DIAGNOSIS — R1310 Dysphagia, unspecified: Secondary | ICD-10-CM

## 2017-09-20 MED ORDER — HYDRALAZINE HCL 25 MG PO TABS: 25.0000 mg | ORAL_TABLET | Freq: Two times a day (BID) | ORAL | 0 refills | Status: AC

## 2017-09-20 MED ORDER — METOPROLOL TARTRATE 100 MG PO TABS: 100.0000 mg | ORAL_TABLET | Freq: Two times a day (BID) | ORAL | Status: DC

## 2017-09-20 MED ORDER — METOPROLOL TARTRATE 100 MG PO TABS: 100.0000 mg | ORAL_TABLET | Freq: Two times a day (BID) | ORAL | 0 refills | Status: DC

## 2017-09-20 NOTE — H&P (Deleted)
Physician Discharge Summary  Heather Jarvis WHQ:[id number] DOB: 12-31-1928 DOA: 09/11/2017  PCP: Eulas Post, MD  Admit date: 09/11/2017 Discharge date: 09/20/2017  Recommendations for Outpatient Follow-up:   Atrial fibrillation, permanent pacemaker, status post sick sinus syndrome --Continue metoprolol, rate control strategy.  Continue apixaban. --Suggest rechecking creatinine next week, may need to decrease dose of apixaban if renal function does not return to less than 1.5, however given that values of 1.19 and 1.33 were seen during this admission, anticipate renal function will return to that level and can continue current dosing.  Acute kidney injury superimposed on CKD stage III, dehydration --Refused labs periodically but renal function has been trending back towards normal.  Mild to moderate aortic stenosis.  Dysphagia, continue diet per speech therapy: Dysphagia 2 diet, thin liquids.   Contact information for follow-up providers    Eulas Post, MD Follow up.   Specialty:  Family Medicine Why:  as needed Contact information: [place] [address] [phone]            Contact information for after-discharge care    Destination    [facility] .   Service:  Skilled Nursing Contact information: [address] [place] [phone]                   Discharge Diagnoses:  1. Acute encephalopathy superimposed on advanced dementia, sundowning noted during hospitalization 2. Possible UTI 3. Acute volume overload secondary to IVF 4. Possible aspiration pneumonia with acute hypoxic resp failure 5. Atrial fibrillation with RVR, permanent pacemaker, status post sick sinus syndrome 6. Hypercalcemia 7. Hypomagnesemia, hypophosphatemia, hypokalemia 8. Acute kidney injury superimposed on CKD stage III, dehydration 9. Dysphagia 10. Advanced dementia  11. Aortic atherosclerosis  12. Mild  to moderate aortic stenosis  Discharge Condition: improved Disposition: Home, daughter will transport to [place] SNF next week.  Diet recommendation: dysphagia 2 diet, thin liquid  Filed Weights   09/19/17 0641 09/19/17 2011 09/20/17 0428  Weight: 69.9 kg (154 lb) 68.9 kg (152 lb) 67.1 kg (148 lb)    History of present illness:  84 year old woman PMH advanced dementia, hypertension, atrial fibrillation, status post pacemaker for sick sinus syndrome, presented with increased lethargy and worsening mental status.  Family in process of moving her to [place] to a nursing home.  Admitted for acute encephalopathy, UTI, dehydration, possible mild CHF.    Hospital Course:  Treated with empiric antibiotics, found to have multiple electrolyte derangements which were corrected.  Acute kidney injury was treated with IV fluids.  Patient subsequently developed acute respiratory distress, was found to be volume overloaded, aspirated.  Was treated with antibiotics with resolution of respiratory distress, pneumonia.  Acute encephalopathy resolved, appears to be at baseline in regard to dementia.  Possible UTI was treated with ceftriaxone.  Assessed by physical therapy, SNF recommended, in discussion with daughter [name] today, patient appears to be functionally at baseline, requires assistance with movement pain primarily only stays in bed or in a chair, making transfers only.  Patient will return home with daughter who placed to transport her to [place] next week.  I discussed the case with physical therapy, she requires some assistance with transfers and encouragement.  This appears to be patient's baseline in discussion with daughter.  Individual issues as below.  Atrial fibrillation, permanent pacemaker, status post sick sinus syndrome --This is chronic in nature.  Will increase metoprolol, continue as an outpatient for heart rate control.  Continue  apixaban.  Acute encephalopathy superimposed on advanced  dementia, sundowning noted during hospitalization.  CT head negative. --Appears to be at baseline at this point with treatment for infection.  Possible UTI. UTI was suspected on admission although urinalysis was equivocal and urine culture was unrevealing.  Treated empirically with ceftriaxone. --Resolved.  Acute volume overload secondary to IVF, now +1.9 L since admission.  --Appears euvolemic.  No evidence of diastolic failure on echocardiogram although this cannot be assessed by echocardiogram because of atrial fibrillation.  Possible aspiration pneumonia with acute hypoxic resp failure --Resolved.  No hypoxia.  Monitor clinically.  Hypercalcemia --Resolved spontaneously with IV fluids, given acute kidney injury, most likely cause was poor oral intake, dehydration.  No further evaluation suggested.  Hypomagnesemia, hypophosphatemia, hypokalemia --Mg, K+ WNL on last check. Phos repleted.  Acute kidney injury superimposed on CKD stage III, dehydration --Refused labs periodically but renal function has been trending back towards normal.  Dysphagia, continue diet per speech therapy: Dysphagia 2 diet, thin liquids.  Advanced dementia  --Refuses labs at times.  Sundowns at night.  Aortic atherosclerosis   Procedures: Echo Study Conclusions  - Left ventricle: The cavity size was normal. Wall thickness was increased in a pattern of moderate LVH. Systolic function was normal. The estimated ejection fraction was in the range of 60% to 65%. The study is not technically sufficient to allow evaluation of LV diastolic function. - Aortic valve: Mildly to moderately calcified annulus. Trileaflet; moderately thickened leaflets. There was mild to moderate stenosis. There was mild regurgitation. Mean gradient (S): 13 mm Hg. Valve area (VTI): 1.01 cm^2. Valve area (Vmax): 1.02 cm^2. Valve area (Vmean): 1.02 cm^2. - Mitral valve: Moderately calcified annulus. Mildly  thickened leaflets . There was mild regurgitation. - Left atrium: The atrium was severely dilated. - Right atrium: The atrium was mildly dilated. - Atrial septum: No defect or patent foramen ovale was identified. - Technically adequate study.  Antimicrobials:  Zosyn 7/6 > 7/10  Ceftriaxone 7/3 > 7/5  Today's assessment: S: no complaints. RN reports no new issues. O: Vitals:  Vitals:   09/20/17 0616 09/20/17 1044  BP: 134/79 124/72  Pulse: 74 82  Resp: (!) 22 18  Temp: (!) 97.5 F (36.4 C) 97.7 F (36.5 C)  SpO2: 99% 100%    Constitutional:  . Appears calm and comfortable Respiratory:  . CTA bilaterally, no w/r/r.  . Respiratory effort normal.  Cardiovascular:  . RRR, 2/6 systolic murmur, no r/g . No LE extremity edema   Musculoskeletal:  . RLE, LLE   o strength and tone normal, no atrophy, no abnormal movements Psychiatric:  . Mental status o Mood, affect appropriate o confused    Discharge Instructions  Discharge Instructions    Discharge instructions   Complete by:  As directed    Call your physician or seek immediate medical attention for pain, fever, worsening confusion, lethargy, shortness or breath or worsening of condition. Dysphagia 2 diet, thin liquids.   Increase activity slowly   Complete by:  As directed      Allergies as of 09/20/2017   No Known Allergies     Medication List    TAKE these medications   apixaban 5 MG Tabs tablet Commonly known as:  ELIQUIS Take 1 tablet (5 mg total) by mouth 2 (two) times daily.   calcium-vitamin D 500-200 MG-UNIT tablet Commonly known as:  OSCAL WITH D Take 1 tablet by mouth 2 (two) times daily.   hydrALAZINE 25 MG tablet Commonly known  as:  APRESOLINE Take 25 mg by mouth 2 (two) times daily.   hydrochlorothiazide 12.5 MG tablet Commonly known as:  HYDRODIURIL Take 12.5 mg by mouth 2 (two) times daily.   levalbuterol 0.63 MG/3ML nebulizer solution Commonly known as:  XOPENEX Take 3 mLs  (0.63 mg total) by nebulization every 6 (six) hours as needed for wheezing or shortness of breath.   metoprolol tartrate 100 MG tablet Commonly known as:  LOPRESSOR Take 1 tablet (100 mg total) by mouth 2 (two) times daily.   multivitamin with minerals Tabs tablet Take 1 tablet by mouth daily. Centrum silver   Vitamin D 2000 units Caps Take 4,000 Units by mouth 2 (two) times daily.      No Known Allergies  The results of significant diagnostics from this hospitalization (including imaging, microbiology, ancillary and laboratory) are listed below for reference.    Significant Diagnostic Studies: Dg Chest 2 View  Result Date: 09/11/2017 CLINICAL DATA:  Weakness. EXAM: CHEST - 2 VIEW COMPARISON:  Radiograph of March 21, 2017. FINDINGS: Stable cardiomegaly is noted. Atherosclerosis of thoracic aorta is noted. Stable left-sided pacemaker is noted. No pneumothorax is noted. Bilateral pulmonary edema is noted. Small bilateral pleural effusions are noted. Bony thorax is unremarkable. IMPRESSION: Stable cardiomegaly probable mild bilateral pulmonary edema. Aortic Atherosclerosis (ICD10-I70.0). Electronically Signed   By: Marijo Conception, M.D.   On: 09/11/2017 15:33   Ct Head Wo Contrast  Result Date: 09/11/2017 CLINICAL DATA:  Altered mental status EXAM: CT HEAD WITHOUT CONTRAST TECHNIQUE: Contiguous axial images were obtained from the base of the skull through the vertex without intravenous contrast. COMPARISON:  03/17/2017 FINDINGS: Brain: Mild atrophic changes and chronic white matter ischemic changes are again identified and stable. No findings to suggest acute hemorrhage, acute infarction or space-occupying mass lesion are seen. Vascular: No hyperdense vessel or unexpected calcification. Skull: Normal. Negative for fracture or focal lesion. Sinuses/Orbits: Postsurgical changes are noted bilaterally. Other: None IMPRESSION: Chronic atrophic and ischemic changes stable from the prior exam. No  acute abnormality is noted. Electronically Signed   By: Inez Catalina M.D.   On: 09/11/2017 15:17   Dg Chest Port 1 View  Result Date: 09/16/2017 CLINICAL DATA:  Shortness of breath EXAM: PORTABLE CHEST 1 VIEW COMPARISON:  September 15, 2017 FINDINGS: There are small pleural effusions bilaterally. There is patchy consolidation in the left base. There is cardiomegaly with pulmonary vascularity normal. Pacemaker leads are attached to the right atrium and right ventricle. No adenopathy. There is aortic atherosclerosis. No evident bone lesions. IMPRESSION: Persistent patchy consolidation left base with small pleural effusions bilaterally. Cardiomegaly. Pulmonary vascularity normal. There is aortic atherosclerosis. Pacemaker leads attached to right atrium and right ventricle. Aortic Atherosclerosis (ICD10-I70.0). Electronically Signed   By: Lowella Grip III M.D.   On: 09/16/2017 07:28   Dg Chest Port 1 View  Result Date: 09/15/2017 CLINICAL DATA:  Shortness of breath. EXAM: PORTABLE CHEST 1 VIEW COMPARISON:  09/14/2017. FINDINGS: Patient rotated.  Suboptimal images. The heart is enlarged. Dual lead pacer appears grossly unchanged. BILATERAL pulmonary opacities suggesting pulmonary edema. IMPRESSION: Rotated radiograph. Probable stable cardiomegaly and pulmonary edema. Electronically Signed   By: Staci Righter M.D.   On: 09/15/2017 07:30   Dg Chest Port 1 View  Result Date: 09/14/2017 CLINICAL DATA:  Dyspnea EXAM: PORTABLE CHEST 1 VIEW COMPARISON:  09/11/2017 chest radiograph. FINDINGS: Stable configuration of 2 lead left subclavian pacemaker. Stable cardiomediastinal silhouette with mild to moderate cardiomegaly. No pneumothorax. No significant right pleural effusion.  Probable small left pleural effusion. Increased prominent patchy parahilar opacities in both lungs. IMPRESSION: 1. Cardiomegaly with increased prominent patchy parahilar opacities in both lungs, favor worsening pulmonary edema due to congestive heart  failure. 2. Probable small left pleural effusion. Electronically Signed   By: Ilona Sorrel M.D.   On: 09/14/2017 10:55    Microbiology: Recent Results (from the past 240 hour(s))  Urine culture     Status: Abnormal   Collection Time: 09/11/17  5:09 PM  Result Value Ref Range Status   Specimen Description URINE, CLEAN CATCH  Final   Special Requests   Final    NONE Performed at [facility], [place] [address] [place], [place] [address]    Culture MULTIPLE SPECIES PRESENT, SUGGEST RECOLLECTION (A)  Final   Report Status 09/12/2017 FINAL  Final  Culture, Urine     Status: None   Collection Time: 09/14/17  7:54 AM  Result Value Ref Range Status   Specimen Description URINE, RANDOM  Final   Special Requests NONE  Final   Culture   Final    NO GROWTH Performed at [facility], [place] [address] [place], [place] [address]    Report Status 09/15/2017 FINAL  Final     Labs: Basic Metabolic Panel: Recent Labs  Lab 09/14/17 0521 09/15/17 0656 09/17/17 0359 09/18/17 0422  NA 145 148* 143 143  K 4.0 3.0* 3.3* 3.8  CL 111 104 106 107  CO2 23 28 26 23   GLUCOSE 130* 147* 92 89  BUN 25* 27* 40* 40*  CREATININE 1.19* 1.70* 1.67* 1.56*  CALCIUM 9.4 9.1 8.0* 7.8*  MG 1.6* 1.4* 1.8 1.8  PHOS 4.6 3.1 2.1* 1.8*   Liver Function Tests: Recent Labs  Lab 09/14/17 0521 09/15/17 0656 09/17/17 0359 09/18/17 0422  AST 30 27 17 17   ALT 23 25 22 23   ALKPHOS 66 65 59 58  BILITOT 0.7 1.2 0.7 0.7  PROT 6.3* 6.7 6.1* 6.2*  ALBUMIN 3.0* 3.1* 2.7* 2.9*   CBC: Recent Labs  Lab 09/14/17 0521 09/15/17 0656 09/17/17 0359 09/18/17 0422  WBC 15.6* 20.3* 12.6* 13.9*  NEUTROABS 11.7* 19.0* 10.0* 10.5*  HGB 13.0 13.2 12.6 12.6  HCT 39.8 39.4 39.0 38.9  MCV 102.1* 100.5* 101.0* 102.4*  PLT 266 258 245 264    Recent Labs    09/14/17 1417  BNP 831.8*    Active Problems:   Cardiac pacemaker in situ   Dementia   CKD (chronic kidney disease), stage III (HCC)   AF (paroxysmal  atrial fibrillation) (HCC)   UTI (urinary tract infection)   Aspiration into airway   Acute encephalopathy   Volume overload   Aspiration pneumonia (HCC)   AKI (acute kidney injury) ([place])   Dysphagia   Time coordinating discharge: 35 minutes  Signed:  Murray Hodgkins, MD [facility] 09/20/2017, 1:33 PM

## 2017-09-20 NOTE — Clinical Social Work Note (Signed)
Patient has discharged home today, transported by daughter. Ms. Al Corpus advised that facility in [place] was provided with requested information (after approved by [place] Surveyor, quantity).  Curby Carswell Givens, MSW, LCSW Licensed Clinical Social Worker [place] [phone]

## 2017-09-20 NOTE — Discharge Summary (Signed)
Physician Discharge Summary  Heather Jarvis AJO:[id number] DOB: 05/21/1928 DOA: 09/11/2017  PCP: Eulas Post, MD  Admit date: 09/11/2017 Discharge date: 09/20/2017  Recommendations for Outpatient Follow-up:   Atrial fibrillation, permanent pacemaker, status post sick sinus syndrome --Continue metoprolol, rate control strategy.  Continue apixaban. --Suggest rechecking creatinine next week, may need to decrease dose of apixaban if renal function does not return to less than 1.5, however given that values of 1.19 and 1.33 were seen during this admission, anticipate renal function will return to that level and can continue current dosing.  Acute kidney injury superimposed on CKD stage III, dehydration --Refused labs periodically but renal function has been trending back towards normal.  Mild to moderate aortic stenosis.  Dysphagia,continue diet per speech therapy: Dysphagia 2 diet, thin liquids.       Contact information for follow-up providers        Eulas Post, MD Follow up.   Specialty:  Family Medicine Why:  as needed Contact information: [place] [address] [phone]                 Contact information for after-discharge care        Destination        [facility] .   Service:  Skilled Nursing Contact information: [address] [place] [phone]                     Discharge Diagnoses:  1. Acute encephalopathy superimposed on advanced dementia, sundowning noted during hospitalization 2. Possible UTI 3. Acute volume overload secondary to IVF 4. Possible aspiration pneumonia with acute hypoxic resp failure 5. Atrial fibrillation with RVR, permanent pacemaker, status post sick sinus syndrome 6. Hypercalcemia 7. Hypomagnesemia, hypophosphatemia, hypokalemia 8. Acute kidney injury superimposed on CKD stage III, dehydration 9. Dysphagia 10. Advanced  dementia 11. Aortic atherosclerosis 12. Mild to moderate aortic stenosis  Discharge Condition: improved Disposition: Home, daughter will transport to [place] SNF next week.  Diet recommendation: dysphagia 2 diet, thin liquid       Filed Weights   09/19/17 0641 09/19/17 2011 09/20/17 0428  Weight: 69.9 kg (154 lb) 68.9 kg (152 lb) 67.1 kg (148 lb)    History of present illness:  84 year old woman PMH advanced dementia, hypertension, atrial fibrillation, status post pacemaker for sick sinus syndrome, presented with increased lethargy and worsening mental status. Family in process of moving her to [place] to a nursing home. Admitted for acute encephalopathy, UTI, dehydration, possible mild CHF.   Hospital Course:  Treated with empiric antibiotics, found to have multiple electrolyte derangements which were corrected. Acute kidney injury was treated with IV fluids. Patient subsequently developed acute respiratory distress, was found to be volume overloaded, aspirated.  Was treated with antibiotics with resolution of respiratory distress, pneumonia.  Acute encephalopathy resolved, appears to be at baseline in regard to dementia.  Possible UTI was treated with ceftriaxone.  Assessed by physical therapy, SNF recommended, in discussion with daughter [name] today, patient appears to be functionally at baseline, requires assistance with movement pain primarily only stays in bed or in a chair, making transfers only.  Patient will return home with daughter who placed to transport her to [place] next week.  I discussed the case with physical therapy, she requires some assistance with transfers and encouragement.  This appears to be patient's baseline in discussion with daughter.  Individual issues as below.  Atrial fibrillation, permanent pacemaker, status post sick sinus  syndrome --This is chronic in nature.  Will increase metoprolol, continue as an outpatient for heart rate control.   Continue apixaban.  Acute encephalopathy superimposed on advanced dementia, sundowning noted during hospitalization. CT head negative. --Appears to be at baseline at this point with treatment for infection.  Possible UTI.UTI was suspected on admission although urinalysis was equivocal and urine culture was unrevealing. Treated empirically with ceftriaxone. --Resolved.  Acute volume overload secondary to IVF, now +1.9 L since admission.  --Appears euvolemic.  No evidence of diastolic failure on echocardiogram although this cannot be assessed by echocardiogram because of atrial fibrillation.  Possible aspiration pneumonia with acute hypoxic resp failure --Resolved. No hypoxia. Monitor clinically.  Hypercalcemia --Resolved spontaneously with IV fluids, given acute kidney injury, most likely cause was poor oral intake, dehydration. No further evaluation suggested.  Hypomagnesemia, hypophosphatemia, hypokalemia --Mg, K+ WNL on last check. Phosrepleted.  Acute kidney injury superimposed on CKD stage III, dehydration --Refused labs periodically but renal function has been trending back towards normal.  Dysphagia,continue diet per speech therapy: Dysphagia 2 diet, thin liquids.  Advanced dementia --Refuses labs at times. Sundowns at night.  Aortic atherosclerosis  Procedures: Echo Study Conclusions  - Left ventricle: The cavity size was normal. Wall thickness was increased in a pattern of moderate LVH. Systolic function was normal. The estimated ejection fraction was in the range of 60% to 65%. The study is not technically sufficient to allow evaluation of LV diastolic function. - Aortic valve: Mildly to moderately calcified annulus. Trileaflet; moderately thickened leaflets. There was mild to moderate stenosis. There was mild regurgitation. Mean gradient (S): 13 mm Hg. Valve area (VTI): 1.01 cm^2. Valve area (Vmax): 1.02 cm^2. Valve area  (Vmean): 1.02 cm^2. - Mitral valve: Moderately calcified annulus. Mildly thickened leaflets . There was mild regurgitation. - Left atrium: The atrium was severely dilated. - Right atrium: The atrium was mildly dilated. - Atrial septum: No defect or patent foramen ovale was identified. - Technically adequate study.  Antimicrobials:  Zosyn 7/6 > 7/10  Ceftriaxone 7/3 > 7/5  Today's assessment: S: no complaints. RN reports no new issues. O: Vitals:      Vitals:   09/20/17 0616 09/20/17 1044  BP: 134/79 124/72  Pulse: 74 82  Resp: (!) 22 18  Temp: (!) 97.5 F (36.4 C) 97.7 F (36.5 C)  SpO2: 99% 100%    Constitutional:   Appears calm and comfortable Respiratory:   CTA bilaterally, no w/r/r.   Respiratory effort normal.  Cardiovascular:   RRR, 2/6 systolic murmur, no r/g  No LE extremity edema   Musculoskeletal:   RLE, LLE   ? strength and tone normal, no atrophy, no abnormal movements Psychiatric:   Mental status ? Mood, affect appropriate ? confused    Discharge Instructions      Discharge Instructions    Discharge instructions   Complete by:  As directed    Call your physician or seek immediate medical attention for pain, fever, worsening confusion, lethargy, shortness or breath or worsening of condition. Dysphagia 2 diet, thin liquids.   Increase activity slowly   Complete by:  As directed      Allergies as of 09/20/2017   No Known Allergies        Medication List    TAKE these medications   apixaban 5 MG Tabs tablet Commonly known as:  ELIQUIS Take 1 tablet (5 mg total) by mouth 2 (two) times daily.   calcium-vitamin D 500-200 MG-UNIT tablet Commonly known as:  OSCAL WITH D Take 1 tablet by mouth 2 (two) times daily.   hydrALAZINE 25 MG tablet Commonly known as:  APRESOLINE Take 25 mg by mouth 2 (two) times daily.   hydrochlorothiazide 12.5 MG tablet Commonly known as:  HYDRODIURIL Take 12.5 mg by mouth 2  (two) times daily.   levalbuterol 0.63 MG/3ML nebulizer solution Commonly known as:  XOPENEX Take 3 mLs (0.63 mg total) by nebulization every 6 (six) hours as needed for wheezing or shortness of breath.   metoprolol tartrate 100 MG tablet Commonly known as:  LOPRESSOR Take 1 tablet (100 mg total) by mouth 2 (two) times daily.   multivitamin with minerals Tabs tablet Take 1 tablet by mouth daily. Centrum silver   Vitamin D 2000 units Caps Take 4,000 Units by mouth 2 (two) times daily.      No Known Allergies   The results of significant diagnostics from this hospitalization (including imaging, microbiology, ancillary and laboratory) are listed below for reference.    Significant Diagnostic Studies:  ImagingResults  Dg Chest 2 View  Result Date: 09/11/2017 CLINICAL DATA:  Weakness. EXAM: CHEST - 2 VIEW COMPARISON:  Radiograph of March 21, 2017. FINDINGS: Stable cardiomegaly is noted. Atherosclerosis of thoracic aorta is noted. Stable left-sided pacemaker is noted. No pneumothorax is noted. Bilateral pulmonary edema is noted. Small bilateral pleural effusions are noted. Bony thorax is unremarkable. IMPRESSION: Stable cardiomegaly probable mild bilateral pulmonary edema. Aortic Atherosclerosis (ICD10-I70.0). Electronically Signed   By: Marijo Conception, M.D.   On: 09/11/2017 15:33   Ct Head Wo Contrast  Result Date: 09/11/2017 CLINICAL DATA:  Altered mental status EXAM: CT HEAD WITHOUT CONTRAST TECHNIQUE: Contiguous axial images were obtained from the base of the skull through the vertex without intravenous contrast. COMPARISON:  03/17/2017 FINDINGS: Brain: Mild atrophic changes and chronic white matter ischemic changes are again identified and stable. No findings to suggest acute hemorrhage, acute infarction or space-occupying mass lesion are seen. Vascular: No hyperdense vessel or unexpected calcification. Skull: Normal. Negative for fracture or focal lesion.  Sinuses/Orbits: Postsurgical changes are noted bilaterally. Other: None IMPRESSION: Chronic atrophic and ischemic changes stable from the prior exam. No acute abnormality is noted. Electronically Signed   By: Inez Catalina M.D.   On: 09/11/2017 15:17   Dg Chest Port 1 View  Result Date: 09/16/2017 CLINICAL DATA:  Shortness of breath EXAM: PORTABLE CHEST 1 VIEW COMPARISON:  September 15, 2017 FINDINGS: There are small pleural effusions bilaterally. There is patchy consolidation in the left base. There is cardiomegaly with pulmonary vascularity normal. Pacemaker leads are attached to the right atrium and right ventricle. No adenopathy. There is aortic atherosclerosis. No evident bone lesions. IMPRESSION: Persistent patchy consolidation left base with small pleural effusions bilaterally. Cardiomegaly. Pulmonary vascularity normal. There is aortic atherosclerosis. Pacemaker leads attached to right atrium and right ventricle. Aortic Atherosclerosis (ICD10-I70.0). Electronically Signed   By: Lowella Grip III M.D.   On: 09/16/2017 07:28   Dg Chest Port 1 View  Result Date: 09/15/2017 CLINICAL DATA:  Shortness of breath. EXAM: PORTABLE CHEST 1 VIEW COMPARISON:  09/14/2017. FINDINGS: Patient rotated.  Suboptimal images. The heart is enlarged. Dual lead pacer appears grossly unchanged. BILATERAL pulmonary opacities suggesting pulmonary edema. IMPRESSION: Rotated radiograph. Probable stable cardiomegaly and pulmonary edema. Electronically Signed   By: Staci Righter M.D.   On: 09/15/2017 07:30   Dg Chest Port 1 View  Result Date: 09/14/2017 CLINICAL DATA:  Dyspnea EXAM: PORTABLE CHEST 1 VIEW COMPARISON:  09/11/2017 chest radiograph.  FINDINGS: Stable configuration of 2 lead left subclavian pacemaker. Stable cardiomediastinal silhouette with mild to moderate cardiomegaly. No pneumothorax. No significant right pleural effusion. Probable small left pleural effusion. Increased prominent patchy parahilar opacities in  both lungs. IMPRESSION: 1. Cardiomegaly with increased prominent patchy parahilar opacities in both lungs, favor worsening pulmonary edema due to congestive heart failure. 2. Probable small left pleural effusion. Electronically Signed   By: Ilona Sorrel M.D.   On: 09/14/2017 10:55     Microbiology:        Recent Results (from the past 240 hour(s))  Urine culture     Status: Abnormal   Collection Time: 09/11/17  5:09 PM  Result Value Ref Range Status   Specimen Description URINE, CLEAN CATCH  Final   Special Requests   Final    NONE Performed at [facility], [place] [address] [place], [place] [address]    Culture MULTIPLE SPECIES PRESENT, SUGGEST RECOLLECTION (A)  Final   Report Status 09/12/2017 FINAL  Final  Culture, Urine     Status: None   Collection Time: 09/14/17  7:54 AM  Result Value Ref Range Status   Specimen Description URINE, RANDOM  Final   Special Requests NONE  Final   Culture   Final    NO GROWTH Performed at [facility], [place] [address] [place], [place] [address]    Report Status 09/15/2017 FINAL  Final     Labs: Basic Metabolic Panel: LastLabs        Recent Labs  Lab 09/14/17 0521 09/15/17 0656 09/17/17 0359 09/18/17 0422  NA 145 148* 143 143  K 4.0 3.0* 3.3* 3.8  CL 111 104 106 107  CO2 23 28 26 23   GLUCOSE 130* 147* 92 89  BUN 25* 27* 40* 40*  CREATININE 1.19* 1.70* 1.67* 1.56*  CALCIUM 9.4 9.1 8.0* 7.8*  MG 1.6* 1.4* 1.8 1.8  PHOS 4.6 3.1 2.1* 1.8*     Liver Function Tests: LastLabs        Recent Labs  Lab 09/14/17 0521 09/15/17 0656 09/17/17 0359 09/18/17 0422  AST 30 27 17 17   ALT 23 25 22 23   ALKPHOS 66 65 59 58  BILITOT 0.7 1.2 0.7 0.7  PROT 6.3* 6.7 6.1* 6.2*  ALBUMIN 3.0* 3.1* 2.7* 2.9*     CBC: LastLabs  Recent Labs  Lab 09/14/17 0521 09/15/17 0656 09/17/17 0359 09/18/17 0422  WBC 15.6* 20.3* 12.6* 13.9*  NEUTROABS 11.7* 19.0* 10.0* 10.5*  HGB 13.0 13.2 12.6 12.6   HCT 39.8 39.4 39.0 38.9  MCV 102.1* 100.5* 101.0* 102.4*  PLT 266 258 245 264      RecentLabs(withinlast365days)  Recent Labs    09/14/17 1417  BNP 831.8*      Active Problems:   Cardiac pacemaker in situ   Dementia   CKD (chronic kidney disease), stage III (HCC)   AF (paroxysmal atrial fibrillation) (HCC)   UTI (urinary tract infection)   Aspiration into airway   Acute encephalopathy   Volume overload   Aspiration pneumonia ([place])   AKI (acute kidney injury) ([place])   Dysphagia   Time coordinating discharge: 35 minutes  Signed:  Murray Hodgkins, MD [facility] 09/20/2017, 1:33 PM

## 2017-09-20 NOTE — Progress Notes (Signed)
Orders received to "comment on safety of patient," being transferred to SNF in [place] in private vehicle. Discussed with CM, Lorriane Shire, and ultimately up to family's discretion of which transport to utilize. Please see note from yesterday, 7/11 for insight on patient's currently mobility status.  [name], PT, DPT Acute Rehabilitation Services  Pager: [phone]

## 2017-09-20 NOTE — Progress Notes (Signed)
Patient discharged to home with Daughter in  [place]. After visit Summary reviewed. Family capable of reverbalizing medications and follow up visits. No signs and symptoms of distress noted. Patient educated to return to the ED in the case of an emergency. Dillon Bjork RN

## 2017-09-30 ENCOUNTER — Encounter: Payer: Medicare HMO | Admitting: Internal Medicine

## 2017-10-09 ENCOUNTER — Telehealth: Payer: Self-pay | Admitting: Family Medicine

## 2017-10-09 DIAGNOSIS — Z0279 Encounter for issue of other medical certificate: Secondary | ICD-10-CM

## 2017-10-09 NOTE — Telephone Encounter (Signed)
Placed in Dr Burchette's folder 

## 2017-10-09 NOTE — Telephone Encounter (Signed)
Gasper Lloyd dropped off forms to fill out for the patient from [facility].  Fax forms to: [phone] (keep fax confirmation)   Call Gasper Lloyd to pick up forms at: [phone] (after faxed)  Disposition: Dr's Folder

## 2017-10-13 DIAGNOSIS — J449 Chronic obstructive pulmonary disease, unspecified: Secondary | ICD-10-CM | POA: Diagnosis not present

## 2017-10-14 DIAGNOSIS — R269 Unspecified abnormalities of gait and mobility: Secondary | ICD-10-CM | POA: Diagnosis not present

## 2017-10-14 NOTE — Telephone Encounter (Signed)
The forms were picked up  The form fee is $50  The patient would like to have that billed to her.

## 2017-10-23 ENCOUNTER — Ambulatory Visit (INDEPENDENT_AMBULATORY_CARE_PROVIDER_SITE_OTHER): Payer: Medicare HMO | Admitting: Family Medicine

## 2017-10-23 ENCOUNTER — Encounter: Payer: Self-pay | Admitting: Family Medicine

## 2017-10-23 VITALS — BP 134/80 | HR 80 | Temp 97.4°F | Wt 138.2 lb

## 2017-10-23 DIAGNOSIS — I10 Essential (primary) hypertension: Secondary | ICD-10-CM

## 2017-10-23 DIAGNOSIS — N183 Chronic kidney disease, stage 3 unspecified: Secondary | ICD-10-CM

## 2017-10-23 DIAGNOSIS — Z111 Encounter for screening for respiratory tuberculosis: Secondary | ICD-10-CM | POA: Diagnosis not present

## 2017-10-23 DIAGNOSIS — F0281 Dementia in other diseases classified elsewhere with behavioral disturbance: Secondary | ICD-10-CM

## 2017-10-23 DIAGNOSIS — G309 Alzheimer's disease, unspecified: Secondary | ICD-10-CM | POA: Diagnosis not present

## 2017-10-23 DIAGNOSIS — I480 Paroxysmal atrial fibrillation: Secondary | ICD-10-CM | POA: Diagnosis not present

## 2017-10-23 DIAGNOSIS — R350 Frequency of micturition: Secondary | ICD-10-CM | POA: Diagnosis not present

## 2017-10-23 DIAGNOSIS — R69 Illness, unspecified: Secondary | ICD-10-CM | POA: Diagnosis not present

## 2017-10-23 LAB — BASIC METABOLIC PANEL
BUN: 16 mg/dL (ref 6–23)
CALCIUM: 9.7 mg/dL (ref 8.4–10.5)
CO2: 28 mEq/L (ref 19–32)
CREATININE: 1.28 mg/dL — AB (ref 0.40–1.20)
Chloride: 105 mEq/L (ref 96–112)
GFR: 41.9 mL/min — AB (ref >60.00)
Glucose, Bld: 84 mg/dL (ref 70–99)
Potassium: 4 mEq/L (ref 3.5–5.1)
Sodium: 141 mEq/L (ref 135–145)

## 2017-10-23 LAB — POCT URINALYSIS DIPSTICK
Bilirubin, UA: NEGATIVE
Glucose, UA: NEGATIVE
KETONES UA: NEGATIVE
Nitrite, UA: NEGATIVE
PH UA: 7 (ref 5.0–8.0)
Protein, UA: NEGATIVE
SPEC GRAV UA: 1.01 (ref 1.010–1.025)
UROBILINOGEN UA: 0.2 U/dL

## 2017-10-23 MED ORDER — CEPHALEXIN 500 MG PO CAPS: 500.0000 mg | ORAL_CAPSULE | Freq: Three times a day (TID) | ORAL | 0 refills | Status: AC

## 2017-10-23 MED ORDER — METOPROLOL TARTRATE 100 MG PO TABS
100.0000 mg | ORAL_TABLET | Freq: Two times a day (BID) | ORAL | 1 refills | Status: AC
Start: 2017-10-23 — End: ?

## 2017-10-23 MED ORDER — HYDROCHLOROTHIAZIDE 12.5 MG PO TABS: 12.5000 mg | ORAL_TABLET | Freq: Two times a day (BID) | ORAL | 1 refills | Status: DC

## 2017-10-23 NOTE — Progress Notes (Signed)
  Subjective:     Patient ID: Heather Jarvis, female   DOB: [birth year], 84 y.o.   MRN: [id number]  HPI  Patient seen for hospital follow-up. She has advanced dementia. She was seen here early July with some change in mental status with increased lethargy, weakness, decreased appetite and decreased ambulation. We had admitted her at that point and she was there from 7/3 through 09/20/17.   Patient was discharged with dx of acute encephalopathy superimposed on advanced dementia. Possible UTI treated with antibiotics. Her culture did not grow out any pathogen. There was question of possible aspiration pneumonia with acute hypoxia. She had atrial fibrillation with rapid ventricular response  She had CT of the head that was negative for bleed. She had a bump in her chronic kidney disease. Her baseline creatinine has been around 1.2. Daughter-in-law states that she is swallowing well with no dysphagia issues. No cough.  Daughter does state that she's had some cloudy urine past couple days with increased odor and also some increased frequency. No fever.  Family is looking at placing her in a nursing home in [place] which is closer to other family.  They are requiring tuberculosis screen. Daughter is requesting if possible blood screen for TB.    Past Medical History:  Diagnosis Date  . Arthritis   . Hyperlipidemia   . Hypertension   . Osteoporosis   . Sick sinus syndrome [facility])    Past Surgical History:  Procedure Laterality Date  . ABDOMINAL HYSTERECTOMY    . APPENDECTOMY    . PACEMAKER GENERATOR CHANGE  02/04/2012  . PACEMAKER GENERATOR CHANGE N/A 02/04/2012   Procedure: PACEMAKER GENERATOR CHANGE;  Surgeon: Evans Lance, MD;  Location: [facility] CATH LAB;  Service: Cardiovascular;  Laterality: N/A;  . TONSILLECTOMY    . VESICOVAGINAL FISTULA CLOSURE W/ TAH      reports that she has quit smoking. Her smoking use included cigarettes. She has a 25.00 pack-year smoking history. She quit smokeless  tobacco use about 48 years ago.  Her smokeless tobacco use included snuff. She reports that she does not drink alcohol or use drugs. family history includes Diabetes in her mother; Heart disease in her brother and mother; Hypertension in her mother and sister. No Known Allergies  Review of Systems History not obtainable from patient because of her advanced dementia    Objective:   Physical Exam  Constitutional: She appears well-developed and well-nourished.  HENT:  Mouth/Throat: Oropharynx is clear and moist.  Neck: Neck supple.  Cardiovascular: Normal rate.  Pulmonary/Chest: Effort normal and breath sounds normal.  Musculoskeletal: She exhibits no edema.  Neurological: She is alert.  No focal strength deficits       Assessment:     #1 advanced dementia with recent acute encephalopathy  #2 possible recurrent UTI. Urine dipstick today shows trace blood and trace leukocytes  #3 history of chronic kidney disease  #4 patient needing tuberculosis screening  #5 hypertension stable and at goal    Plan:     -Refill metoprolol for one year -Urine culture sent -Start Keflex 500 mg 3 times a day for 7 days pending culture results -sent Quantiferon Gold plus assay for tuberculosis screening -Recheck basic metabolic panel.  If creatinine up over 1.5 reduce eliquis to 2.5 mg twice a day  Eulas Post MD [facility]

## 2017-10-23 NOTE — Patient Instructions (Signed)

## 2017-10-24 LAB — URINE CULTURE
MICRO NUMBER:: 90965794
SPECIMEN QUALITY: ADEQUATE

## 2017-10-25 LAB — QUANTIFERON-TB GOLD PLUS
Mitogen-NIL: >10 IU/mL
NIL: 0.04 [IU]/mL
QUANTIFERON-TB GOLD PLUS: NEGATIVE
TB1-NIL: 0.01 [IU]/mL
TB2-NIL: 0.02 IU/mL

## 2017-11-01 DIAGNOSIS — R2689 Other abnormalities of gait and mobility: Secondary | ICD-10-CM | POA: Diagnosis not present

## 2017-11-01 DIAGNOSIS — R1312 Dysphagia, oropharyngeal phase: Secondary | ICD-10-CM | POA: Diagnosis not present

## 2017-11-01 DIAGNOSIS — M6281 Muscle weakness (generalized): Secondary | ICD-10-CM | POA: Diagnosis not present

## 2017-11-01 DIAGNOSIS — Z741 Need for assistance with personal care: Secondary | ICD-10-CM | POA: Diagnosis not present

## 2017-11-01 DIAGNOSIS — I10 Essential (primary) hypertension: Secondary | ICD-10-CM | POA: Diagnosis not present

## 2017-11-01 DIAGNOSIS — R262 Difficulty in walking, not elsewhere classified: Secondary | ICD-10-CM | POA: Diagnosis not present

## 2017-11-01 DIAGNOSIS — R1311 Dysphagia, oral phase: Secondary | ICD-10-CM | POA: Diagnosis not present

## 2017-11-01 DIAGNOSIS — R2681 Unsteadiness on feet: Secondary | ICD-10-CM | POA: Diagnosis not present

## 2017-11-01 DIAGNOSIS — G9341 Metabolic encephalopathy: Secondary | ICD-10-CM | POA: Diagnosis not present

## 2017-11-02 DIAGNOSIS — R2681 Unsteadiness on feet: Secondary | ICD-10-CM | POA: Diagnosis not present

## 2017-11-02 DIAGNOSIS — M6281 Muscle weakness (generalized): Secondary | ICD-10-CM | POA: Diagnosis not present

## 2017-11-02 DIAGNOSIS — R2689 Other abnormalities of gait and mobility: Secondary | ICD-10-CM | POA: Diagnosis not present

## 2017-11-02 DIAGNOSIS — I10 Essential (primary) hypertension: Secondary | ICD-10-CM | POA: Diagnosis not present

## 2017-11-03 DIAGNOSIS — I4891 Unspecified atrial fibrillation: Secondary | ICD-10-CM | POA: Diagnosis not present

## 2017-11-03 DIAGNOSIS — G309 Alzheimer's disease, unspecified: Secondary | ICD-10-CM | POA: Diagnosis not present

## 2017-11-03 DIAGNOSIS — E785 Hyperlipidemia, unspecified: Secondary | ICD-10-CM | POA: Diagnosis not present

## 2017-11-03 DIAGNOSIS — I10 Essential (primary) hypertension: Secondary | ICD-10-CM | POA: Diagnosis not present

## 2017-11-04 DIAGNOSIS — R2681 Unsteadiness on feet: Secondary | ICD-10-CM | POA: Diagnosis not present

## 2017-11-04 DIAGNOSIS — M6281 Muscle weakness (generalized): Secondary | ICD-10-CM | POA: Diagnosis not present

## 2017-11-04 DIAGNOSIS — N990 Postprocedural (acute) (chronic) kidney failure: Secondary | ICD-10-CM | POA: Diagnosis not present

## 2017-11-04 DIAGNOSIS — I10 Essential (primary) hypertension: Secondary | ICD-10-CM | POA: Diagnosis not present

## 2017-11-04 DIAGNOSIS — R2689 Other abnormalities of gait and mobility: Secondary | ICD-10-CM | POA: Diagnosis not present

## 2017-11-05 DIAGNOSIS — I10 Essential (primary) hypertension: Secondary | ICD-10-CM | POA: Diagnosis not present

## 2017-11-05 DIAGNOSIS — N189 Chronic kidney disease, unspecified: Secondary | ICD-10-CM | POA: Diagnosis not present

## 2017-11-05 DIAGNOSIS — R2681 Unsteadiness on feet: Secondary | ICD-10-CM | POA: Diagnosis not present

## 2017-11-05 DIAGNOSIS — M6281 Muscle weakness (generalized): Secondary | ICD-10-CM | POA: Diagnosis not present

## 2017-11-05 DIAGNOSIS — G309 Alzheimer's disease, unspecified: Secondary | ICD-10-CM | POA: Diagnosis not present

## 2017-11-05 DIAGNOSIS — I4891 Unspecified atrial fibrillation: Secondary | ICD-10-CM | POA: Diagnosis not present

## 2017-11-05 DIAGNOSIS — R2689 Other abnormalities of gait and mobility: Secondary | ICD-10-CM | POA: Diagnosis not present

## 2017-11-06 DIAGNOSIS — R2681 Unsteadiness on feet: Secondary | ICD-10-CM | POA: Diagnosis not present

## 2017-11-06 DIAGNOSIS — I10 Essential (primary) hypertension: Secondary | ICD-10-CM | POA: Diagnosis not present

## 2017-11-06 DIAGNOSIS — R2689 Other abnormalities of gait and mobility: Secondary | ICD-10-CM | POA: Diagnosis not present

## 2017-11-06 DIAGNOSIS — M6281 Muscle weakness (generalized): Secondary | ICD-10-CM | POA: Diagnosis not present

## 2017-11-08 DIAGNOSIS — R0981 Nasal congestion: Secondary | ICD-10-CM | POA: Diagnosis not present

## 2017-11-08 DIAGNOSIS — R05 Cough: Secondary | ICD-10-CM | POA: Diagnosis not present

## 2017-11-08 DIAGNOSIS — R918 Other nonspecific abnormal finding of lung field: Secondary | ICD-10-CM | POA: Diagnosis not present

## 2017-11-12 DIAGNOSIS — G9341 Metabolic encephalopathy: Secondary | ICD-10-CM | POA: Diagnosis not present

## 2017-11-12 DIAGNOSIS — M6281 Muscle weakness (generalized): Secondary | ICD-10-CM | POA: Diagnosis not present

## 2017-11-12 DIAGNOSIS — R262 Difficulty in walking, not elsewhere classified: Secondary | ICD-10-CM | POA: Diagnosis not present

## 2017-11-12 DIAGNOSIS — R2681 Unsteadiness on feet: Secondary | ICD-10-CM | POA: Diagnosis not present

## 2017-11-12 DIAGNOSIS — R2689 Other abnormalities of gait and mobility: Secondary | ICD-10-CM | POA: Diagnosis not present

## 2017-11-12 DIAGNOSIS — R1312 Dysphagia, oropharyngeal phase: Secondary | ICD-10-CM | POA: Diagnosis not present

## 2017-11-12 DIAGNOSIS — I10 Essential (primary) hypertension: Secondary | ICD-10-CM | POA: Diagnosis not present

## 2017-11-12 DIAGNOSIS — Z741 Need for assistance with personal care: Secondary | ICD-10-CM | POA: Diagnosis not present

## 2017-11-12 DIAGNOSIS — R1311 Dysphagia, oral phase: Secondary | ICD-10-CM | POA: Diagnosis not present

## 2017-11-13 DIAGNOSIS — J449 Chronic obstructive pulmonary disease, unspecified: Secondary | ICD-10-CM | POA: Diagnosis not present

## 2017-11-13 DIAGNOSIS — R2681 Unsteadiness on feet: Secondary | ICD-10-CM | POA: Diagnosis not present

## 2017-11-13 DIAGNOSIS — M6281 Muscle weakness (generalized): Secondary | ICD-10-CM | POA: Diagnosis not present

## 2017-11-13 DIAGNOSIS — G9341 Metabolic encephalopathy: Secondary | ICD-10-CM | POA: Diagnosis not present

## 2017-11-13 DIAGNOSIS — R262 Difficulty in walking, not elsewhere classified: Secondary | ICD-10-CM | POA: Diagnosis not present

## 2017-11-13 DIAGNOSIS — R2689 Other abnormalities of gait and mobility: Secondary | ICD-10-CM | POA: Diagnosis not present

## 2017-11-13 DIAGNOSIS — I10 Essential (primary) hypertension: Secondary | ICD-10-CM | POA: Diagnosis not present

## 2017-11-13 DIAGNOSIS — R05 Cough: Secondary | ICD-10-CM | POA: Diagnosis not present

## 2017-11-13 DIAGNOSIS — R0981 Nasal congestion: Secondary | ICD-10-CM | POA: Diagnosis not present

## 2017-11-13 DIAGNOSIS — R1311 Dysphagia, oral phase: Secondary | ICD-10-CM | POA: Diagnosis not present

## 2017-11-13 DIAGNOSIS — Z741 Need for assistance with personal care: Secondary | ICD-10-CM | POA: Diagnosis not present

## 2017-11-13 DIAGNOSIS — I509 Heart failure, unspecified: Secondary | ICD-10-CM | POA: Diagnosis not present

## 2017-11-13 DIAGNOSIS — R1312 Dysphagia, oropharyngeal phase: Secondary | ICD-10-CM | POA: Diagnosis not present

## 2017-11-14 DIAGNOSIS — R2689 Other abnormalities of gait and mobility: Secondary | ICD-10-CM | POA: Diagnosis not present

## 2017-11-14 DIAGNOSIS — R269 Unspecified abnormalities of gait and mobility: Secondary | ICD-10-CM | POA: Diagnosis not present

## 2017-11-14 DIAGNOSIS — G9341 Metabolic encephalopathy: Secondary | ICD-10-CM | POA: Diagnosis not present

## 2017-11-14 DIAGNOSIS — Z741 Need for assistance with personal care: Secondary | ICD-10-CM | POA: Diagnosis not present

## 2017-11-14 DIAGNOSIS — R2681 Unsteadiness on feet: Secondary | ICD-10-CM | POA: Diagnosis not present

## 2017-11-14 DIAGNOSIS — M6281 Muscle weakness (generalized): Secondary | ICD-10-CM | POA: Diagnosis not present

## 2017-11-14 DIAGNOSIS — R1312 Dysphagia, oropharyngeal phase: Secondary | ICD-10-CM | POA: Diagnosis not present

## 2017-11-14 DIAGNOSIS — R262 Difficulty in walking, not elsewhere classified: Secondary | ICD-10-CM | POA: Diagnosis not present

## 2017-11-14 DIAGNOSIS — R1311 Dysphagia, oral phase: Secondary | ICD-10-CM | POA: Diagnosis not present

## 2017-11-14 DIAGNOSIS — I10 Essential (primary) hypertension: Secondary | ICD-10-CM | POA: Diagnosis not present

## 2017-11-15 DIAGNOSIS — I10 Essential (primary) hypertension: Secondary | ICD-10-CM | POA: Diagnosis not present

## 2017-11-15 DIAGNOSIS — R1311 Dysphagia, oral phase: Secondary | ICD-10-CM | POA: Diagnosis not present

## 2017-11-15 DIAGNOSIS — R1312 Dysphagia, oropharyngeal phase: Secondary | ICD-10-CM | POA: Diagnosis not present

## 2017-11-15 DIAGNOSIS — G9341 Metabolic encephalopathy: Secondary | ICD-10-CM | POA: Diagnosis not present

## 2017-11-15 DIAGNOSIS — R2681 Unsteadiness on feet: Secondary | ICD-10-CM | POA: Diagnosis not present

## 2017-11-15 DIAGNOSIS — M6281 Muscle weakness (generalized): Secondary | ICD-10-CM | POA: Diagnosis not present

## 2017-11-15 DIAGNOSIS — R2689 Other abnormalities of gait and mobility: Secondary | ICD-10-CM | POA: Diagnosis not present

## 2017-11-15 DIAGNOSIS — R262 Difficulty in walking, not elsewhere classified: Secondary | ICD-10-CM | POA: Diagnosis not present

## 2017-11-15 DIAGNOSIS — Z741 Need for assistance with personal care: Secondary | ICD-10-CM | POA: Diagnosis not present

## 2017-11-20 DIAGNOSIS — N189 Chronic kidney disease, unspecified: Secondary | ICD-10-CM | POA: Diagnosis not present

## 2017-11-20 DIAGNOSIS — R531 Weakness: Secondary | ICD-10-CM | POA: Diagnosis not present

## 2017-11-22 DIAGNOSIS — R4182 Altered mental status, unspecified: Secondary | ICD-10-CM | POA: Diagnosis not present

## 2017-11-22 DIAGNOSIS — R6520 Severe sepsis without septic shock: Secondary | ICD-10-CM | POA: Diagnosis not present

## 2017-11-22 DIAGNOSIS — R627 Adult failure to thrive: Secondary | ICD-10-CM | POA: Diagnosis not present

## 2017-11-22 DIAGNOSIS — N390 Urinary tract infection, site not specified: Secondary | ICD-10-CM | POA: Diagnosis not present

## 2017-11-22 DIAGNOSIS — I700 Atherosclerosis of aorta: Secondary | ICD-10-CM | POA: Diagnosis not present

## 2017-11-22 DIAGNOSIS — E876 Hypokalemia: Secondary | ICD-10-CM | POA: Diagnosis not present

## 2017-11-22 DIAGNOSIS — M810 Age-related osteoporosis without current pathological fracture: Secondary | ICD-10-CM | POA: Diagnosis not present

## 2017-11-22 DIAGNOSIS — G9340 Encephalopathy, unspecified: Secondary | ICD-10-CM | POA: Diagnosis not present

## 2017-11-22 DIAGNOSIS — I4891 Unspecified atrial fibrillation: Secondary | ICD-10-CM | POA: Diagnosis not present

## 2017-11-22 DIAGNOSIS — D72829 Elevated white blood cell count, unspecified: Secondary | ICD-10-CM | POA: Diagnosis not present

## 2017-11-22 DIAGNOSIS — G300 Alzheimer's disease with early onset: Secondary | ICD-10-CM | POA: Diagnosis not present

## 2017-11-22 DIAGNOSIS — A419 Sepsis, unspecified organism: Secondary | ICD-10-CM | POA: Diagnosis not present

## 2017-11-22 DIAGNOSIS — I2510 Atherosclerotic heart disease of native coronary artery without angina pectoris: Secondary | ICD-10-CM | POA: Diagnosis not present

## 2017-11-22 DIAGNOSIS — E210 Primary hyperparathyroidism: Secondary | ICD-10-CM | POA: Diagnosis not present

## 2017-11-22 DIAGNOSIS — R69 Illness, unspecified: Secondary | ICD-10-CM | POA: Diagnosis not present

## 2017-11-22 DIAGNOSIS — N189 Chronic kidney disease, unspecified: Secondary | ICD-10-CM | POA: Diagnosis not present

## 2017-11-22 DIAGNOSIS — R52 Pain, unspecified: Secondary | ICD-10-CM | POA: Diagnosis not present

## 2017-11-22 DIAGNOSIS — G9341 Metabolic encephalopathy: Secondary | ICD-10-CM | POA: Diagnosis not present

## 2017-11-22 DIAGNOSIS — E213 Hyperparathyroidism, unspecified: Secondary | ICD-10-CM | POA: Diagnosis not present

## 2017-11-22 DIAGNOSIS — I129 Hypertensive chronic kidney disease with stage 1 through stage 4 chronic kidney disease, or unspecified chronic kidney disease: Secondary | ICD-10-CM | POA: Diagnosis not present

## 2017-11-22 DIAGNOSIS — E785 Hyperlipidemia, unspecified: Secondary | ICD-10-CM | POA: Diagnosis not present

## 2017-11-22 DIAGNOSIS — N179 Acute kidney failure, unspecified: Secondary | ICD-10-CM | POA: Diagnosis not present

## 2017-11-22 DIAGNOSIS — M6281 Muscle weakness (generalized): Secondary | ICD-10-CM | POA: Diagnosis not present

## 2017-11-22 DIAGNOSIS — G309 Alzheimer's disease, unspecified: Secondary | ICD-10-CM | POA: Diagnosis not present

## 2017-11-22 DIAGNOSIS — G92 Toxic encephalopathy: Secondary | ICD-10-CM | POA: Diagnosis not present

## 2017-11-25 ENCOUNTER — Telehealth: Payer: Self-pay | Admitting: Family Medicine

## 2017-11-25 NOTE — Telephone Encounter (Signed)
Called daughter Heather Jarvis and gave her PTH and Calcium levels. Heather Jarvis stated that her moms calcium was 29 since her mother went to the nursing facility on 8/24. Heather Jarvis stated that she will call back if she needs anything else.

## 2017-11-25 NOTE — Telephone Encounter (Signed)
Copied from [place] [id number]. Topic: Quick Communication - See Telephone Encounter >> Nov 25, 2017  5:04 PM Rutherford Nail, NT wrote: CRM for notification. See Telephone encounter for: 11/25/17. Gasper Lloyd, daughter in law, calling and states that the hospital in [place] is needing to know if calcium and PTH levels were ever drawn on the patient. Please advise. CB#: [phone]

## 2017-11-28 NOTE — Telephone Encounter (Signed)
[name] crew is requesting a call in regards to a few question on Patient test results.

## 2017-11-29 DIAGNOSIS — I2510 Atherosclerotic heart disease of native coronary artery without angina pectoris: Secondary | ICD-10-CM | POA: Diagnosis not present

## 2017-11-29 DIAGNOSIS — M6281 Muscle weakness (generalized): Secondary | ICD-10-CM | POA: Diagnosis not present

## 2017-11-29 DIAGNOSIS — R69 Illness, unspecified: Secondary | ICD-10-CM | POA: Diagnosis not present

## 2017-11-29 DIAGNOSIS — G309 Alzheimer's disease, unspecified: Secondary | ICD-10-CM | POA: Diagnosis not present

## 2017-11-29 DIAGNOSIS — M810 Age-related osteoporosis without current pathological fracture: Secondary | ICD-10-CM | POA: Diagnosis not present

## 2017-11-29 DIAGNOSIS — G300 Alzheimer's disease with early onset: Secondary | ICD-10-CM | POA: Diagnosis not present

## 2017-11-29 DIAGNOSIS — R52 Pain, unspecified: Secondary | ICD-10-CM | POA: Diagnosis not present

## 2017-11-29 DIAGNOSIS — N390 Urinary tract infection, site not specified: Secondary | ICD-10-CM | POA: Diagnosis not present

## 2017-11-29 DIAGNOSIS — I4891 Unspecified atrial fibrillation: Secondary | ICD-10-CM | POA: Diagnosis not present

## 2017-11-29 DIAGNOSIS — E213 Hyperparathyroidism, unspecified: Secondary | ICD-10-CM | POA: Diagnosis not present

## 2017-11-29 DIAGNOSIS — I509 Heart failure, unspecified: Secondary | ICD-10-CM | POA: Diagnosis not present

## 2017-11-29 DIAGNOSIS — N189 Chronic kidney disease, unspecified: Secondary | ICD-10-CM | POA: Diagnosis not present

## 2017-11-29 DIAGNOSIS — D72829 Elevated white blood cell count, unspecified: Secondary | ICD-10-CM | POA: Diagnosis not present

## 2017-11-29 DIAGNOSIS — I700 Atherosclerosis of aorta: Secondary | ICD-10-CM | POA: Diagnosis not present

## 2017-11-29 DIAGNOSIS — E210 Primary hyperparathyroidism: Secondary | ICD-10-CM | POA: Diagnosis not present

## 2017-11-29 DIAGNOSIS — E876 Hypokalemia: Secondary | ICD-10-CM | POA: Diagnosis not present

## 2017-11-29 DIAGNOSIS — G9340 Encephalopathy, unspecified: Secondary | ICD-10-CM | POA: Diagnosis not present

## 2017-11-29 DIAGNOSIS — G9341 Metabolic encephalopathy: Secondary | ICD-10-CM | POA: Diagnosis not present

## 2017-11-29 DIAGNOSIS — E785 Hyperlipidemia, unspecified: Secondary | ICD-10-CM | POA: Diagnosis not present

## 2017-11-29 NOTE — Telephone Encounter (Signed)
Left message on machine returning Ms Crew's message.

## 2017-12-02 DIAGNOSIS — E213 Hyperparathyroidism, unspecified: Secondary | ICD-10-CM | POA: Diagnosis not present

## 2017-12-02 DIAGNOSIS — N390 Urinary tract infection, site not specified: Secondary | ICD-10-CM | POA: Diagnosis not present

## 2017-12-04 DIAGNOSIS — I4891 Unspecified atrial fibrillation: Secondary | ICD-10-CM | POA: Diagnosis not present

## 2017-12-04 DIAGNOSIS — N189 Chronic kidney disease, unspecified: Secondary | ICD-10-CM | POA: Diagnosis not present

## 2017-12-04 DIAGNOSIS — G309 Alzheimer's disease, unspecified: Secondary | ICD-10-CM | POA: Diagnosis not present

## 2017-12-05 ENCOUNTER — Telehealth: Payer: Self-pay

## 2017-12-05 ENCOUNTER — Encounter: Payer: Medicare HMO | Admitting: *Deleted

## 2017-12-05 NOTE — Telephone Encounter (Signed)
Confirmed remote transmission w/ pt son.  Pt son states that his mom is in a nursing home in [place]. Her monitor is not there. He is looking for a Dr. There for her.

## 2017-12-06 ENCOUNTER — Encounter: Payer: Self-pay | Admitting: Cardiology

## 2017-12-06 DIAGNOSIS — G309 Alzheimer's disease, unspecified: Secondary | ICD-10-CM | POA: Diagnosis not present

## 2017-12-06 DIAGNOSIS — I509 Heart failure, unspecified: Secondary | ICD-10-CM | POA: Diagnosis not present

## 2017-12-06 DIAGNOSIS — I4891 Unspecified atrial fibrillation: Secondary | ICD-10-CM | POA: Diagnosis not present

## 2017-12-06 DIAGNOSIS — E876 Hypokalemia: Secondary | ICD-10-CM | POA: Diagnosis not present

## 2017-12-09 ENCOUNTER — Other Ambulatory Visit: Payer: Self-pay | Admitting: Internal Medicine

## 2017-12-09 NOTE — Telephone Encounter (Signed)
Eliquis 5mg  refill request received; pt is 84 yrs old, wt-62.7kg, Crea-1.28 on 10/23/17, last seen by Dr. Lovena Le on 06/20/2016-therefore, pt is overdue for follow-up. Last refill was on 09/10/17 and a 3 month supply was given as pt had an appt set for 09/30/17 with Cardiologist but that appt was canceled.  Per last telephone note on 12/05/17 pt has moved to [place] since last hospital admission on 09/11/17-09/20/17; will defer this to Dr. Tanna Furry Nurse as I am unable to fill because pt needs an appt with Cardiologist.

## 2017-12-10 DIAGNOSIS — G309 Alzheimer's disease, unspecified: Secondary | ICD-10-CM | POA: Diagnosis not present

## 2017-12-10 DIAGNOSIS — N390 Urinary tract infection, site not specified: Secondary | ICD-10-CM | POA: Diagnosis not present

## 2017-12-10 DIAGNOSIS — E213 Hyperparathyroidism, unspecified: Secondary | ICD-10-CM | POA: Diagnosis not present

## 2017-12-12 DIAGNOSIS — N390 Urinary tract infection, site not specified: Secondary | ICD-10-CM | POA: Diagnosis not present

## 2017-12-12 DIAGNOSIS — E213 Hyperparathyroidism, unspecified: Secondary | ICD-10-CM | POA: Diagnosis not present

## 2017-12-12 DIAGNOSIS — E876 Hypokalemia: Secondary | ICD-10-CM | POA: Diagnosis not present

## 2017-12-13 DIAGNOSIS — J449 Chronic obstructive pulmonary disease, unspecified: Secondary | ICD-10-CM | POA: Diagnosis not present

## 2017-12-14 DIAGNOSIS — R269 Unspecified abnormalities of gait and mobility: Secondary | ICD-10-CM | POA: Diagnosis not present

## 2017-12-18 DIAGNOSIS — M810 Age-related osteoporosis without current pathological fracture: Secondary | ICD-10-CM | POA: Diagnosis not present

## 2017-12-18 DIAGNOSIS — E785 Hyperlipidemia, unspecified: Secondary | ICD-10-CM | POA: Diagnosis not present

## 2017-12-18 DIAGNOSIS — G309 Alzheimer's disease, unspecified: Secondary | ICD-10-CM | POA: Diagnosis not present

## 2017-12-18 DIAGNOSIS — I509 Heart failure, unspecified: Secondary | ICD-10-CM | POA: Diagnosis not present

## 2017-12-31 DIAGNOSIS — R8290 Unspecified abnormal findings in urine: Secondary | ICD-10-CM | POA: Diagnosis not present

## 2018-01-01 DIAGNOSIS — R8290 Unspecified abnormal findings in urine: Secondary | ICD-10-CM | POA: Diagnosis not present

## 2018-01-05 ENCOUNTER — Other Ambulatory Visit: Payer: Self-pay | Admitting: Family Medicine

## 2018-01-05 DIAGNOSIS — I10 Essential (primary) hypertension: Secondary | ICD-10-CM

## 2018-01-06 NOTE — Telephone Encounter (Signed)
She is now residing in [place] (I believe) in a nursing home facility.. Further refills through new primary.

## 2018-01-06 NOTE — Telephone Encounter (Signed)
Last OV 10/23/17, No future OV  Last filled by Dr. Melene Plan. Sarajane Jews MD on 09/20/17, # 42 with 0 refills

## 2018-01-13 DIAGNOSIS — J449 Chronic obstructive pulmonary disease, unspecified: Secondary | ICD-10-CM | POA: Diagnosis not present

## 2018-01-14 DIAGNOSIS — R269 Unspecified abnormalities of gait and mobility: Secondary | ICD-10-CM | POA: Diagnosis not present

## 2018-01-20 DIAGNOSIS — N189 Chronic kidney disease, unspecified: Secondary | ICD-10-CM | POA: Diagnosis not present

## 2018-01-20 DIAGNOSIS — I10 Essential (primary) hypertension: Secondary | ICD-10-CM | POA: Diagnosis not present

## 2018-01-20 DIAGNOSIS — E213 Hyperparathyroidism, unspecified: Secondary | ICD-10-CM | POA: Diagnosis not present

## 2018-01-20 DIAGNOSIS — N2889 Other specified disorders of kidney and ureter: Secondary | ICD-10-CM | POA: Diagnosis not present

## 2018-01-21 DIAGNOSIS — I495 Sick sinus syndrome: Secondary | ICD-10-CM | POA: Diagnosis not present

## 2018-01-21 DIAGNOSIS — I2510 Atherosclerotic heart disease of native coronary artery without angina pectoris: Secondary | ICD-10-CM | POA: Diagnosis not present

## 2018-01-21 DIAGNOSIS — Z950 Presence of cardiac pacemaker: Secondary | ICD-10-CM | POA: Diagnosis not present

## 2018-01-21 DIAGNOSIS — I10 Essential (primary) hypertension: Secondary | ICD-10-CM | POA: Diagnosis not present

## 2018-01-21 DIAGNOSIS — I482 Chronic atrial fibrillation, unspecified: Secondary | ICD-10-CM | POA: Diagnosis not present

## 2018-01-21 DIAGNOSIS — I350 Nonrheumatic aortic (valve) stenosis: Secondary | ICD-10-CM | POA: Diagnosis not present

## 2018-01-22 DIAGNOSIS — E7220 Disorder of urea cycle metabolism, unspecified: Secondary | ICD-10-CM | POA: Diagnosis not present

## 2018-01-28 DIAGNOSIS — I361 Nonrheumatic tricuspid (valve) insufficiency: Secondary | ICD-10-CM | POA: Diagnosis not present

## 2018-01-28 DIAGNOSIS — N390 Urinary tract infection, site not specified: Secondary | ICD-10-CM | POA: Diagnosis not present

## 2018-01-28 DIAGNOSIS — N2889 Other specified disorders of kidney and ureter: Secondary | ICD-10-CM | POA: Diagnosis not present

## 2018-01-28 DIAGNOSIS — Z950 Presence of cardiac pacemaker: Secondary | ICD-10-CM | POA: Diagnosis not present

## 2018-01-28 DIAGNOSIS — I340 Nonrheumatic mitral (valve) insufficiency: Secondary | ICD-10-CM | POA: Diagnosis not present

## 2018-01-28 DIAGNOSIS — I350 Nonrheumatic aortic (valve) stenosis: Secondary | ICD-10-CM | POA: Diagnosis not present

## 2018-01-28 DIAGNOSIS — N281 Cyst of kidney, acquired: Secondary | ICD-10-CM | POA: Diagnosis not present

## 2018-01-28 DIAGNOSIS — G9341 Metabolic encephalopathy: Secondary | ICD-10-CM | POA: Diagnosis not present

## 2018-01-31 DIAGNOSIS — M79675 Pain in left toe(s): Secondary | ICD-10-CM | POA: Diagnosis not present

## 2018-01-31 DIAGNOSIS — B351 Tinea unguium: Secondary | ICD-10-CM | POA: Diagnosis not present

## 2018-01-31 DIAGNOSIS — I4891 Unspecified atrial fibrillation: Secondary | ICD-10-CM | POA: Diagnosis not present

## 2018-01-31 DIAGNOSIS — M79674 Pain in right toe(s): Secondary | ICD-10-CM | POA: Diagnosis not present

## 2018-01-31 DIAGNOSIS — I739 Peripheral vascular disease, unspecified: Secondary | ICD-10-CM | POA: Diagnosis not present

## 2018-01-31 DIAGNOSIS — N189 Chronic kidney disease, unspecified: Secondary | ICD-10-CM | POA: Diagnosis not present

## 2018-01-31 DIAGNOSIS — G309 Alzheimer's disease, unspecified: Secondary | ICD-10-CM | POA: Diagnosis not present

## 2018-02-11 DIAGNOSIS — G309 Alzheimer's disease, unspecified: Secondary | ICD-10-CM | POA: Diagnosis not present

## 2018-02-11 DIAGNOSIS — I10 Essential (primary) hypertension: Secondary | ICD-10-CM | POA: Diagnosis not present

## 2018-02-11 DIAGNOSIS — I4891 Unspecified atrial fibrillation: Secondary | ICD-10-CM | POA: Diagnosis not present

## 2018-02-11 DIAGNOSIS — I350 Nonrheumatic aortic (valve) stenosis: Secondary | ICD-10-CM | POA: Diagnosis not present

## 2018-02-12 DIAGNOSIS — J449 Chronic obstructive pulmonary disease, unspecified: Secondary | ICD-10-CM | POA: Diagnosis not present

## 2018-02-13 DIAGNOSIS — R269 Unspecified abnormalities of gait and mobility: Secondary | ICD-10-CM | POA: Diagnosis not present

## 2018-02-14 DIAGNOSIS — E213 Hyperparathyroidism, unspecified: Secondary | ICD-10-CM | POA: Diagnosis not present

## 2018-02-17 DIAGNOSIS — I10 Essential (primary) hypertension: Secondary | ICD-10-CM | POA: Diagnosis not present

## 2018-02-17 DIAGNOSIS — E213 Hyperparathyroidism, unspecified: Secondary | ICD-10-CM | POA: Diagnosis not present

## 2018-02-25 DIAGNOSIS — I10 Essential (primary) hypertension: Secondary | ICD-10-CM | POA: Diagnosis not present

## 2018-03-06 DIAGNOSIS — I10 Essential (primary) hypertension: Secondary | ICD-10-CM | POA: Diagnosis not present

## 2018-03-10 DIAGNOSIS — Z79899 Other long term (current) drug therapy: Secondary | ICD-10-CM | POA: Diagnosis not present

## 2018-03-10 DIAGNOSIS — E876 Hypokalemia: Secondary | ICD-10-CM | POA: Diagnosis not present

## 2018-03-15 DIAGNOSIS — J449 Chronic obstructive pulmonary disease, unspecified: Secondary | ICD-10-CM | POA: Diagnosis not present

## 2018-03-16 DIAGNOSIS — R269 Unspecified abnormalities of gait and mobility: Secondary | ICD-10-CM | POA: Diagnosis not present

## 2018-03-20 DIAGNOSIS — N183 Chronic kidney disease, stage 3 (moderate): Secondary | ICD-10-CM | POA: Diagnosis not present

## 2018-03-21 DIAGNOSIS — N189 Chronic kidney disease, unspecified: Secondary | ICD-10-CM | POA: Diagnosis not present

## 2018-03-21 DIAGNOSIS — I509 Heart failure, unspecified: Secondary | ICD-10-CM | POA: Diagnosis not present

## 2018-03-21 DIAGNOSIS — E876 Hypokalemia: Secondary | ICD-10-CM | POA: Diagnosis not present

## 2018-03-24 DIAGNOSIS — N189 Chronic kidney disease, unspecified: Secondary | ICD-10-CM | POA: Diagnosis not present

## 2018-03-24 DIAGNOSIS — I10 Essential (primary) hypertension: Secondary | ICD-10-CM | POA: Diagnosis not present

## 2018-03-24 DIAGNOSIS — N2889 Other specified disorders of kidney and ureter: Secondary | ICD-10-CM | POA: Diagnosis not present

## 2018-03-27 DIAGNOSIS — I10 Essential (primary) hypertension: Secondary | ICD-10-CM | POA: Diagnosis not present

## 2018-03-28 DIAGNOSIS — R9431 Abnormal electrocardiogram [ECG] [EKG]: Secondary | ICD-10-CM | POA: Diagnosis not present

## 2018-03-28 DIAGNOSIS — R7989 Other specified abnormal findings of blood chemistry: Secondary | ICD-10-CM | POA: Diagnosis not present

## 2018-03-28 DIAGNOSIS — R509 Fever, unspecified: Secondary | ICD-10-CM | POA: Diagnosis not present

## 2018-03-28 DIAGNOSIS — N189 Chronic kidney disease, unspecified: Secondary | ICD-10-CM | POA: Diagnosis not present

## 2018-03-28 DIAGNOSIS — J189 Pneumonia, unspecified organism: Secondary | ICD-10-CM | POA: Diagnosis not present

## 2018-03-28 DIAGNOSIS — R6521 Severe sepsis with septic shock: Secondary | ICD-10-CM | POA: Diagnosis not present

## 2018-03-28 DIAGNOSIS — R69 Illness, unspecified: Secondary | ICD-10-CM | POA: Diagnosis not present

## 2018-03-28 DIAGNOSIS — N179 Acute kidney failure, unspecified: Secondary | ICD-10-CM | POA: Diagnosis not present

## 2018-03-28 DIAGNOSIS — R4182 Altered mental status, unspecified: Secondary | ICD-10-CM | POA: Diagnosis not present

## 2018-03-28 DIAGNOSIS — Y95 Nosocomial condition: Secondary | ICD-10-CM | POA: Diagnosis not present

## 2018-03-28 DIAGNOSIS — A419 Sepsis, unspecified organism: Secondary | ICD-10-CM | POA: Diagnosis not present

## 2018-03-28 DIAGNOSIS — E876 Hypokalemia: Secondary | ICD-10-CM | POA: Diagnosis not present

## 2018-03-28 DIAGNOSIS — J811 Chronic pulmonary edema: Secondary | ICD-10-CM | POA: Diagnosis not present

## 2018-03-28 DIAGNOSIS — J440 Chronic obstructive pulmonary disease with acute lower respiratory infection: Secondary | ICD-10-CM | POA: Diagnosis not present

## 2018-03-28 DIAGNOSIS — N136 Pyonephrosis: Secondary | ICD-10-CM | POA: Diagnosis not present

## 2018-03-28 DIAGNOSIS — R0902 Hypoxemia: Secondary | ICD-10-CM | POA: Diagnosis not present

## 2018-03-28 DIAGNOSIS — I130 Hypertensive heart and chronic kidney disease with heart failure and stage 1 through stage 4 chronic kidney disease, or unspecified chronic kidney disease: Secondary | ICD-10-CM | POA: Diagnosis not present

## 2018-03-28 DIAGNOSIS — J9691 Respiratory failure, unspecified with hypoxia: Secondary | ICD-10-CM | POA: Diagnosis not present

## 2018-03-28 DIAGNOSIS — R0600 Dyspnea, unspecified: Secondary | ICD-10-CM | POA: Diagnosis not present

## 2018-03-28 DIAGNOSIS — I480 Paroxysmal atrial fibrillation: Secondary | ICD-10-CM | POA: Diagnosis not present

## 2018-03-28 DIAGNOSIS — N186 End stage renal disease: Secondary | ICD-10-CM | POA: Diagnosis not present

## 2018-03-28 DIAGNOSIS — I482 Chronic atrial fibrillation, unspecified: Secondary | ICD-10-CM | POA: Diagnosis not present

## 2018-03-28 DIAGNOSIS — N390 Urinary tract infection, site not specified: Secondary | ICD-10-CM | POA: Diagnosis not present

## 2018-03-28 DIAGNOSIS — Z515 Encounter for palliative care: Secondary | ICD-10-CM | POA: Diagnosis not present

## 2018-03-28 DIAGNOSIS — G309 Alzheimer's disease, unspecified: Secondary | ICD-10-CM | POA: Diagnosis not present

## 2018-03-28 DIAGNOSIS — N1330 Unspecified hydronephrosis: Secondary | ICD-10-CM | POA: Diagnosis not present

## 2018-03-28 DIAGNOSIS — I4891 Unspecified atrial fibrillation: Secondary | ICD-10-CM | POA: Diagnosis not present

## 2018-03-28 DIAGNOSIS — Z66 Do not resuscitate: Secondary | ICD-10-CM | POA: Diagnosis not present

## 2018-03-28 DIAGNOSIS — B9689 Other specified bacterial agents as the cause of diseases classified elsewhere: Secondary | ICD-10-CM | POA: Diagnosis not present

## 2018-03-28 DIAGNOSIS — G9341 Metabolic encephalopathy: Secondary | ICD-10-CM | POA: Diagnosis not present

## 2018-03-29 DIAGNOSIS — N136 Pyonephrosis: Secondary | ICD-10-CM | POA: Diagnosis not present

## 2018-03-29 DIAGNOSIS — J440 Chronic obstructive pulmonary disease with acute lower respiratory infection: Secondary | ICD-10-CM | POA: Diagnosis not present

## 2018-03-29 DIAGNOSIS — E876 Hypokalemia: Secondary | ICD-10-CM | POA: Diagnosis not present

## 2018-03-29 DIAGNOSIS — J189 Pneumonia, unspecified organism: Secondary | ICD-10-CM | POA: Diagnosis not present

## 2018-03-29 DIAGNOSIS — R69 Illness, unspecified: Secondary | ICD-10-CM | POA: Diagnosis not present

## 2018-03-29 DIAGNOSIS — R0602 Shortness of breath: Secondary | ICD-10-CM | POA: Diagnosis not present

## 2018-03-29 DIAGNOSIS — R7989 Other specified abnormal findings of blood chemistry: Secondary | ICD-10-CM | POA: Diagnosis not present

## 2018-03-29 DIAGNOSIS — I4891 Unspecified atrial fibrillation: Secondary | ICD-10-CM | POA: Diagnosis not present

## 2018-03-29 DIAGNOSIS — J449 Chronic obstructive pulmonary disease, unspecified: Secondary | ICD-10-CM | POA: Diagnosis not present

## 2018-03-29 DIAGNOSIS — I10 Essential (primary) hypertension: Secondary | ICD-10-CM | POA: Diagnosis not present

## 2018-03-29 DIAGNOSIS — Z66 Do not resuscitate: Secondary | ICD-10-CM | POA: Diagnosis not present

## 2018-03-29 DIAGNOSIS — N1330 Unspecified hydronephrosis: Secondary | ICD-10-CM | POA: Diagnosis not present

## 2018-03-29 DIAGNOSIS — I480 Paroxysmal atrial fibrillation: Secondary | ICD-10-CM | POA: Diagnosis not present

## 2018-03-29 DIAGNOSIS — J9600 Acute respiratory failure, unspecified whether with hypoxia or hypercapnia: Secondary | ICD-10-CM | POA: Diagnosis not present

## 2018-03-29 DIAGNOSIS — I130 Hypertensive heart and chronic kidney disease with heart failure and stage 1 through stage 4 chronic kidney disease, or unspecified chronic kidney disease: Secondary | ICD-10-CM | POA: Diagnosis not present

## 2018-03-29 DIAGNOSIS — B9689 Other specified bacterial agents as the cause of diseases classified elsewhere: Secondary | ICD-10-CM | POA: Diagnosis not present

## 2018-03-29 DIAGNOSIS — N179 Acute kidney failure, unspecified: Secondary | ICD-10-CM | POA: Diagnosis not present

## 2018-03-29 DIAGNOSIS — N189 Chronic kidney disease, unspecified: Secondary | ICD-10-CM | POA: Diagnosis not present

## 2018-03-29 DIAGNOSIS — Z515 Encounter for palliative care: Secondary | ICD-10-CM | POA: Diagnosis not present

## 2018-03-29 DIAGNOSIS — A419 Sepsis, unspecified organism: Secondary | ICD-10-CM | POA: Diagnosis not present

## 2018-03-29 DIAGNOSIS — I482 Chronic atrial fibrillation, unspecified: Secondary | ICD-10-CM | POA: Diagnosis not present

## 2018-03-29 DIAGNOSIS — R0600 Dyspnea, unspecified: Secondary | ICD-10-CM | POA: Diagnosis not present

## 2018-03-29 DIAGNOSIS — I509 Heart failure, unspecified: Secondary | ICD-10-CM | POA: Diagnosis not present

## 2018-03-29 DIAGNOSIS — J811 Chronic pulmonary edema: Secondary | ICD-10-CM | POA: Diagnosis not present

## 2018-03-29 DIAGNOSIS — N390 Urinary tract infection, site not specified: Secondary | ICD-10-CM | POA: Diagnosis not present

## 2018-04-12 DEATH — deceased

## 2019-02-12 IMAGING — DX DG CHEST 2V
2 series · 2 of 2 positions shown · non-contrast
Comparison: Radiograph March 21, 2017.

CLINICAL DATA: Weakness.

EXAM:
CHEST - 2 VIEW

[chest lat]
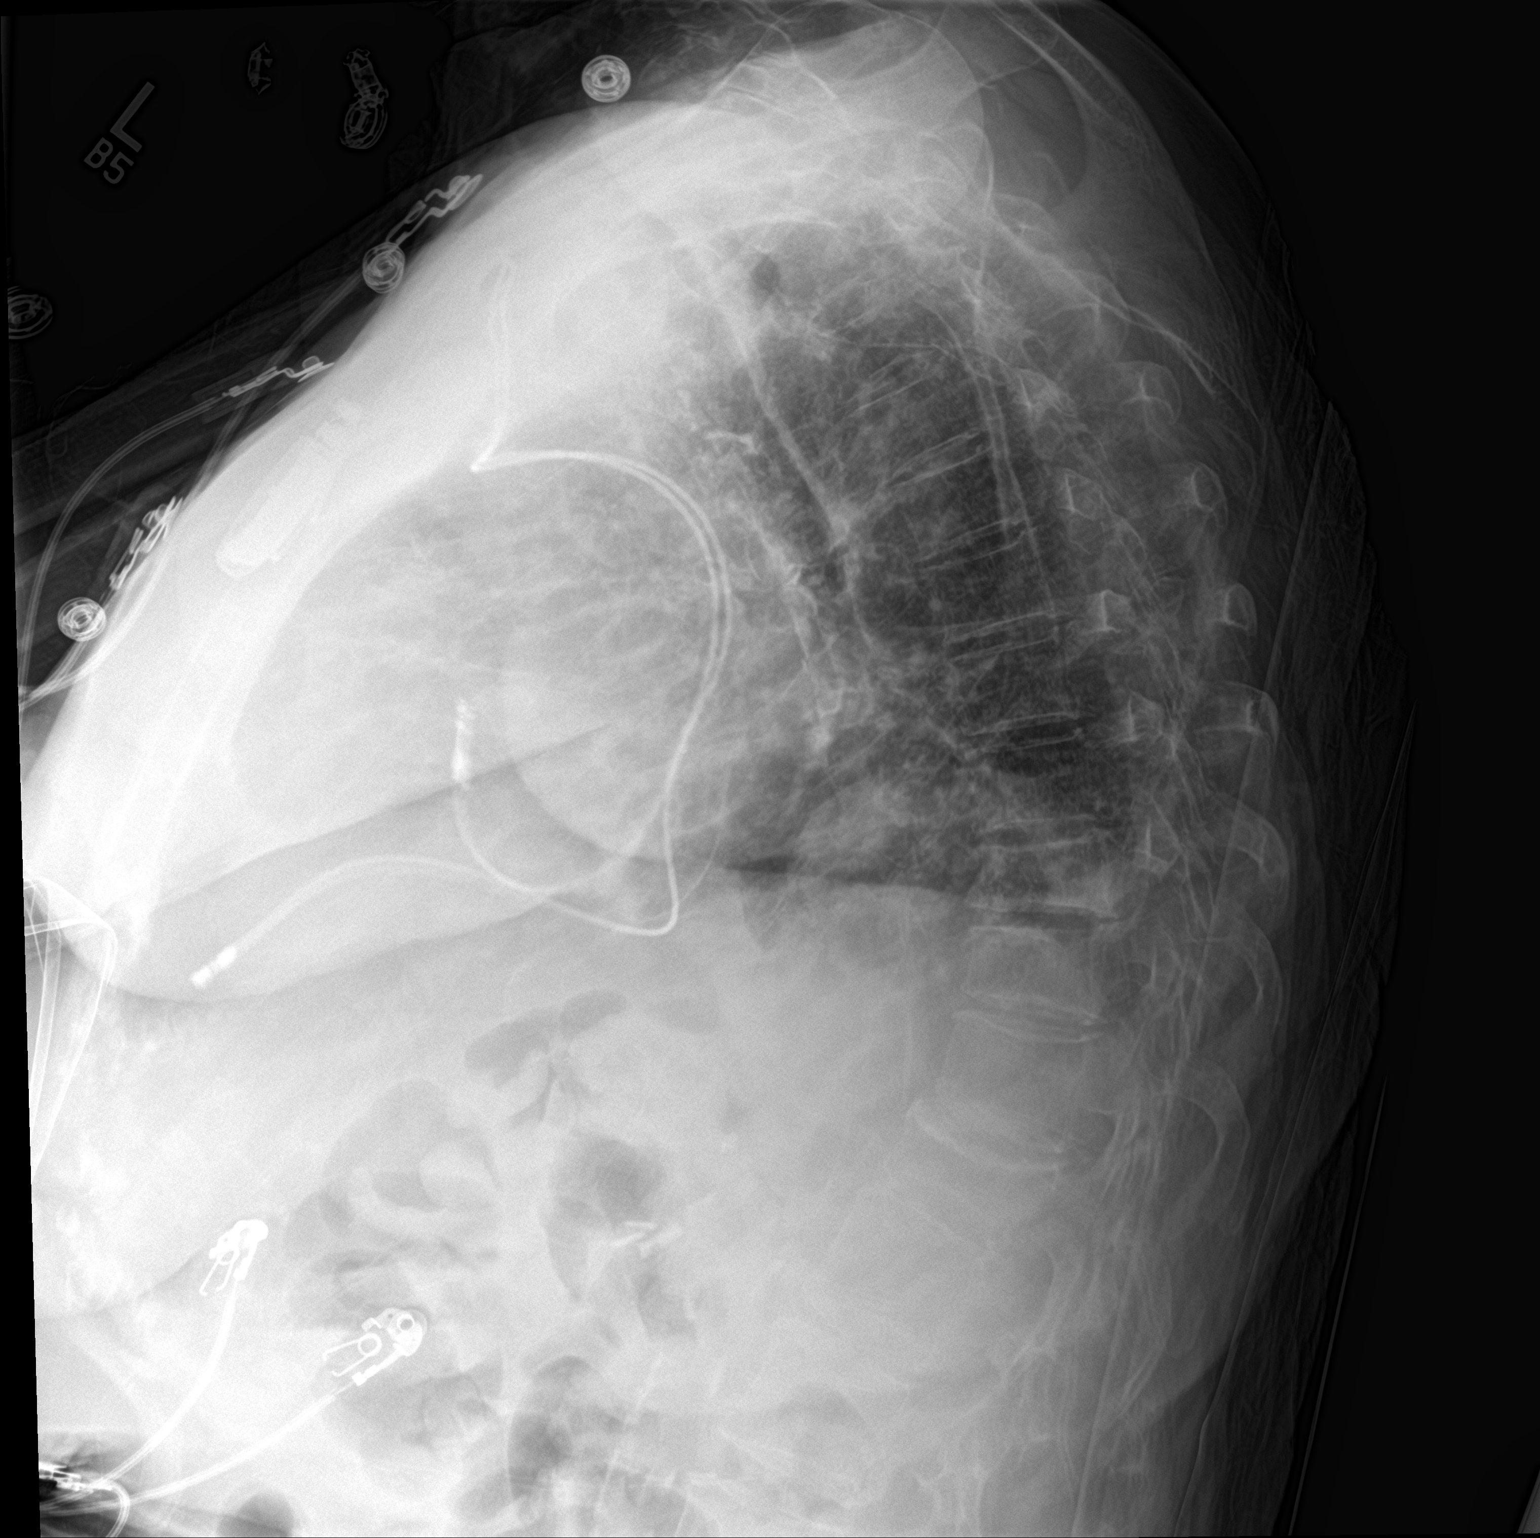

[chest ap]
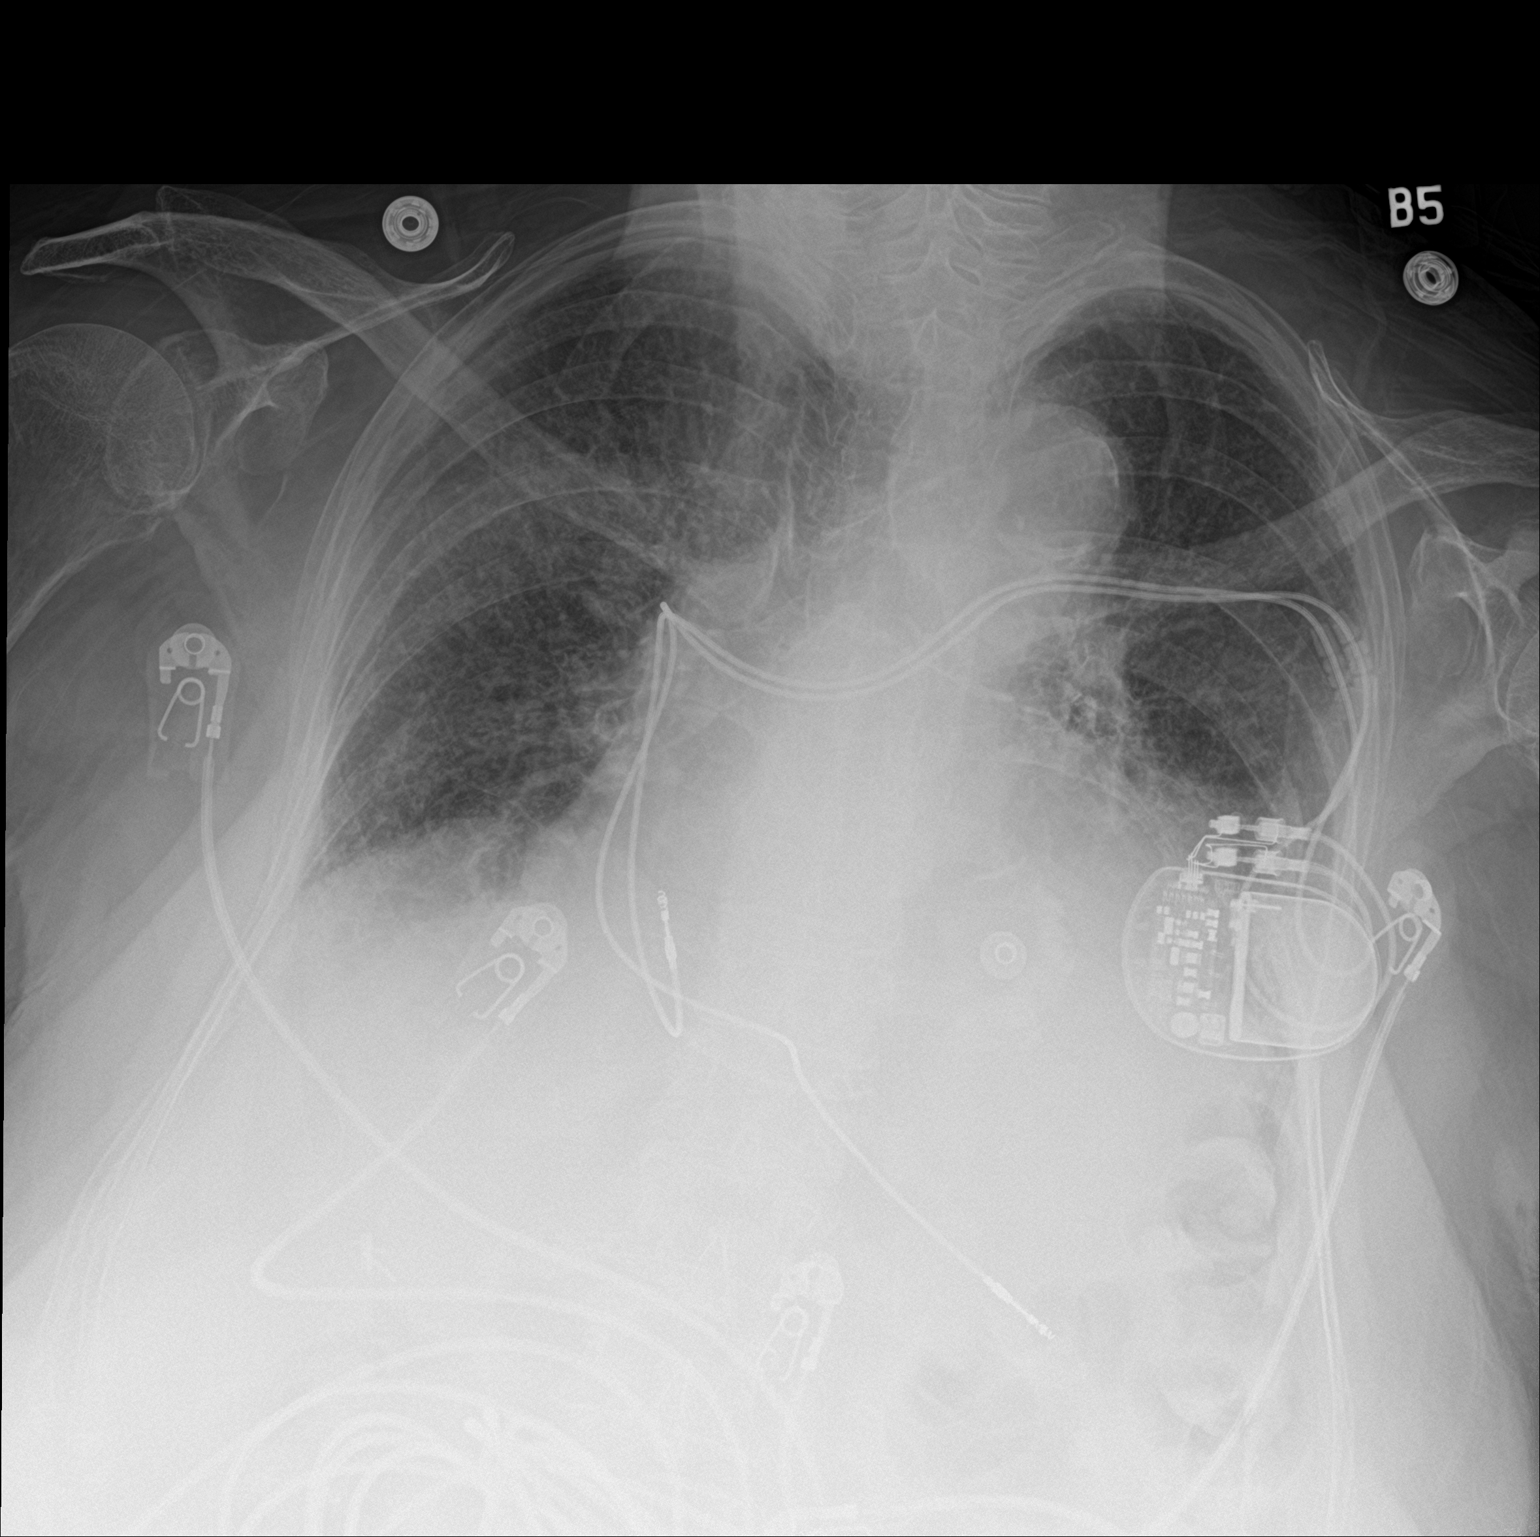

[2 of 2 positions shown; findings below may reference images not displayed]

FINDINGS: Stable cardiomegaly is noted. Atherosclerosis of thoracic aorta is
noted. Stable left-sided pacemaker is noted. No pneumothorax is
noted. Bilateral pulmonary edema is noted. Small bilateral pleural
effusions are noted. Bony thorax is unremarkable.
IMPRESSION: Stable cardiomegaly probable mild bilateral pulmonary edema.

Aortic Atherosclerosis ([id number].G).

## 2019-02-16 IMAGING — DX DG CHEST 1V PORT
1 series · 1 of 1 positions shown · non-contrast
Comparison: 09/14/2017.

CLINICAL DATA: Shortness of breath.

EXAM:
PORTABLE CHEST 1 VIEW

[chest]
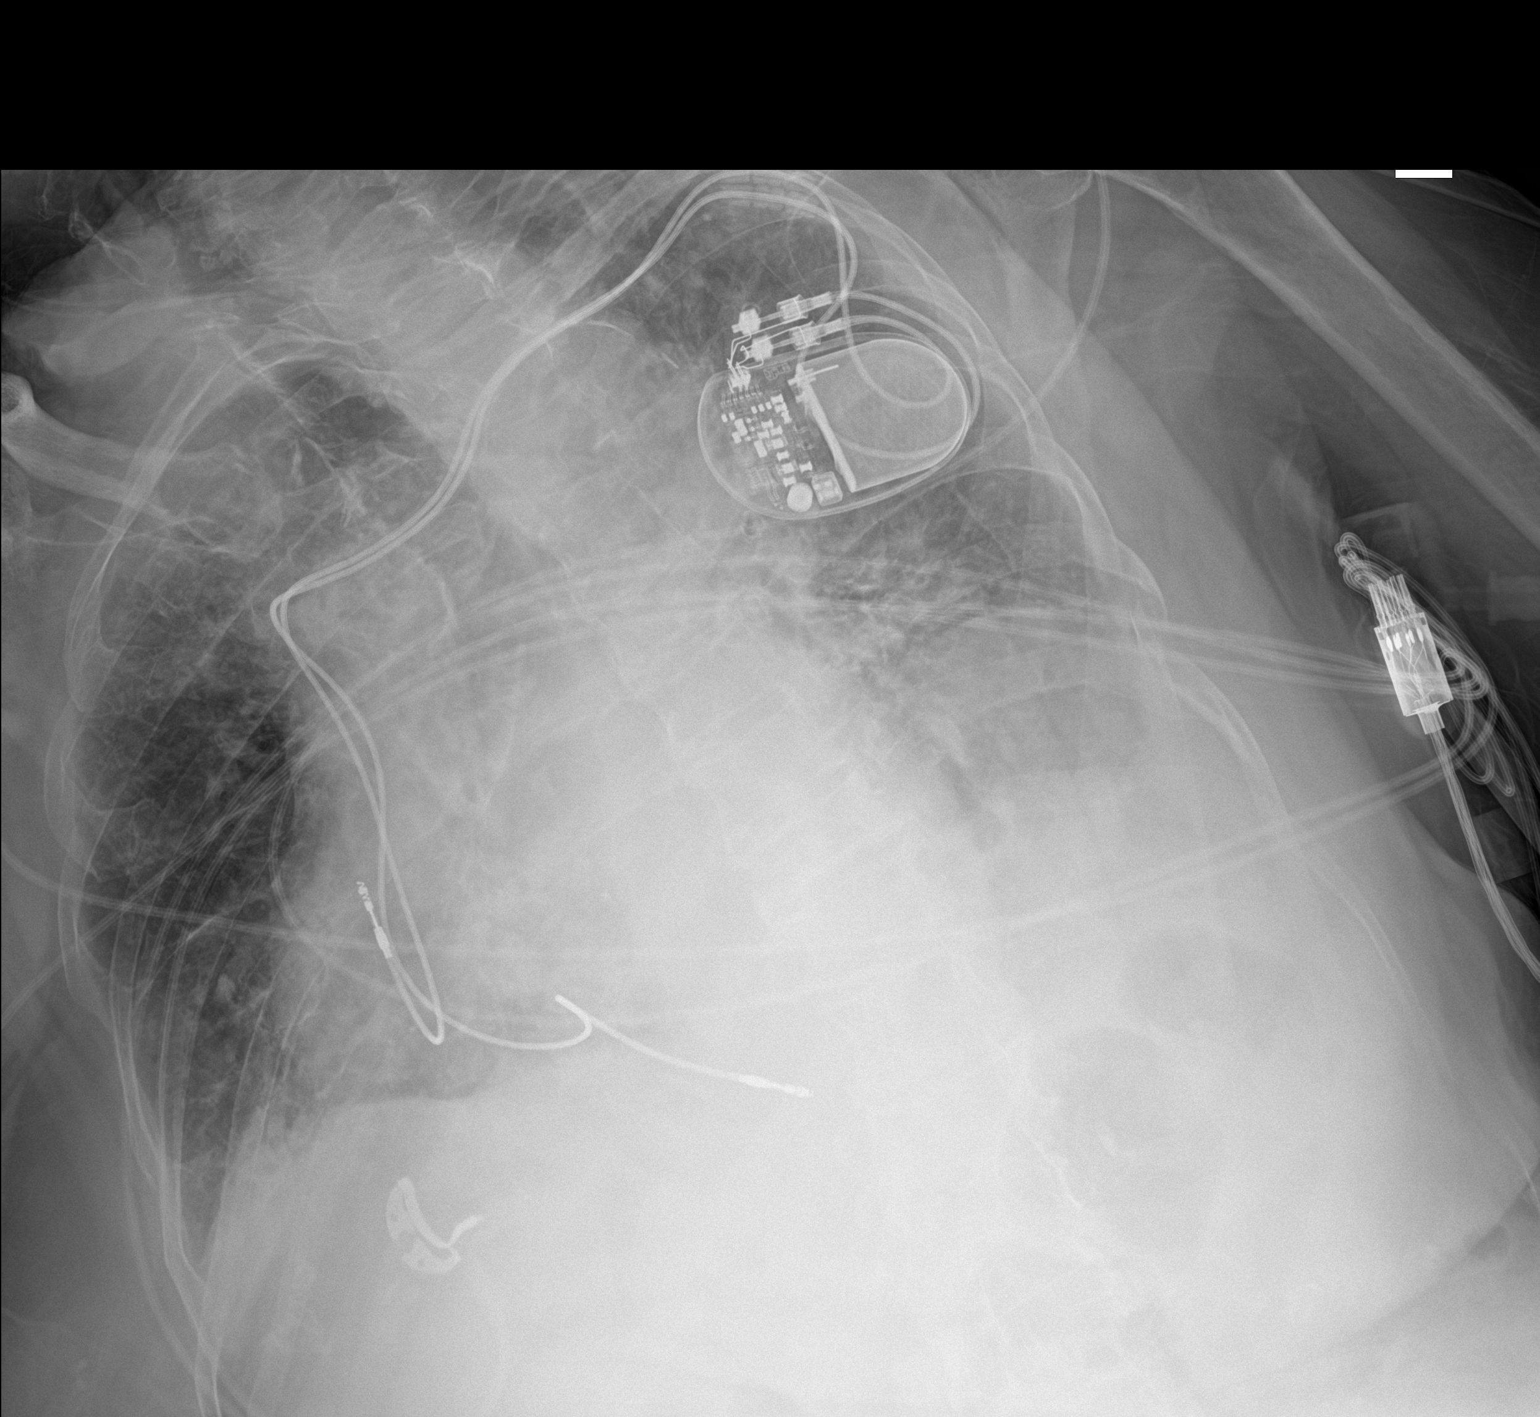

[1 of 1 positions shown; findings below may reference images not displayed]

FINDINGS: Patient rotated.  Suboptimal images.

The heart is enlarged. Dual lead pacer appears grossly unchanged.
BILATERAL pulmonary opacities suggesting pulmonary edema.
IMPRESSION: Rotated radiograph. Probable stable cardiomegaly and pulmonary
edema.

## 2019-02-17 IMAGING — DX DG CHEST 1V PORT
1 series · 1 of 1 positions shown · non-contrast
Comparison: September 15, 2017

CLINICAL DATA: Shortness of breath

EXAM:
PORTABLE CHEST 1 VIEW

[chest ap]
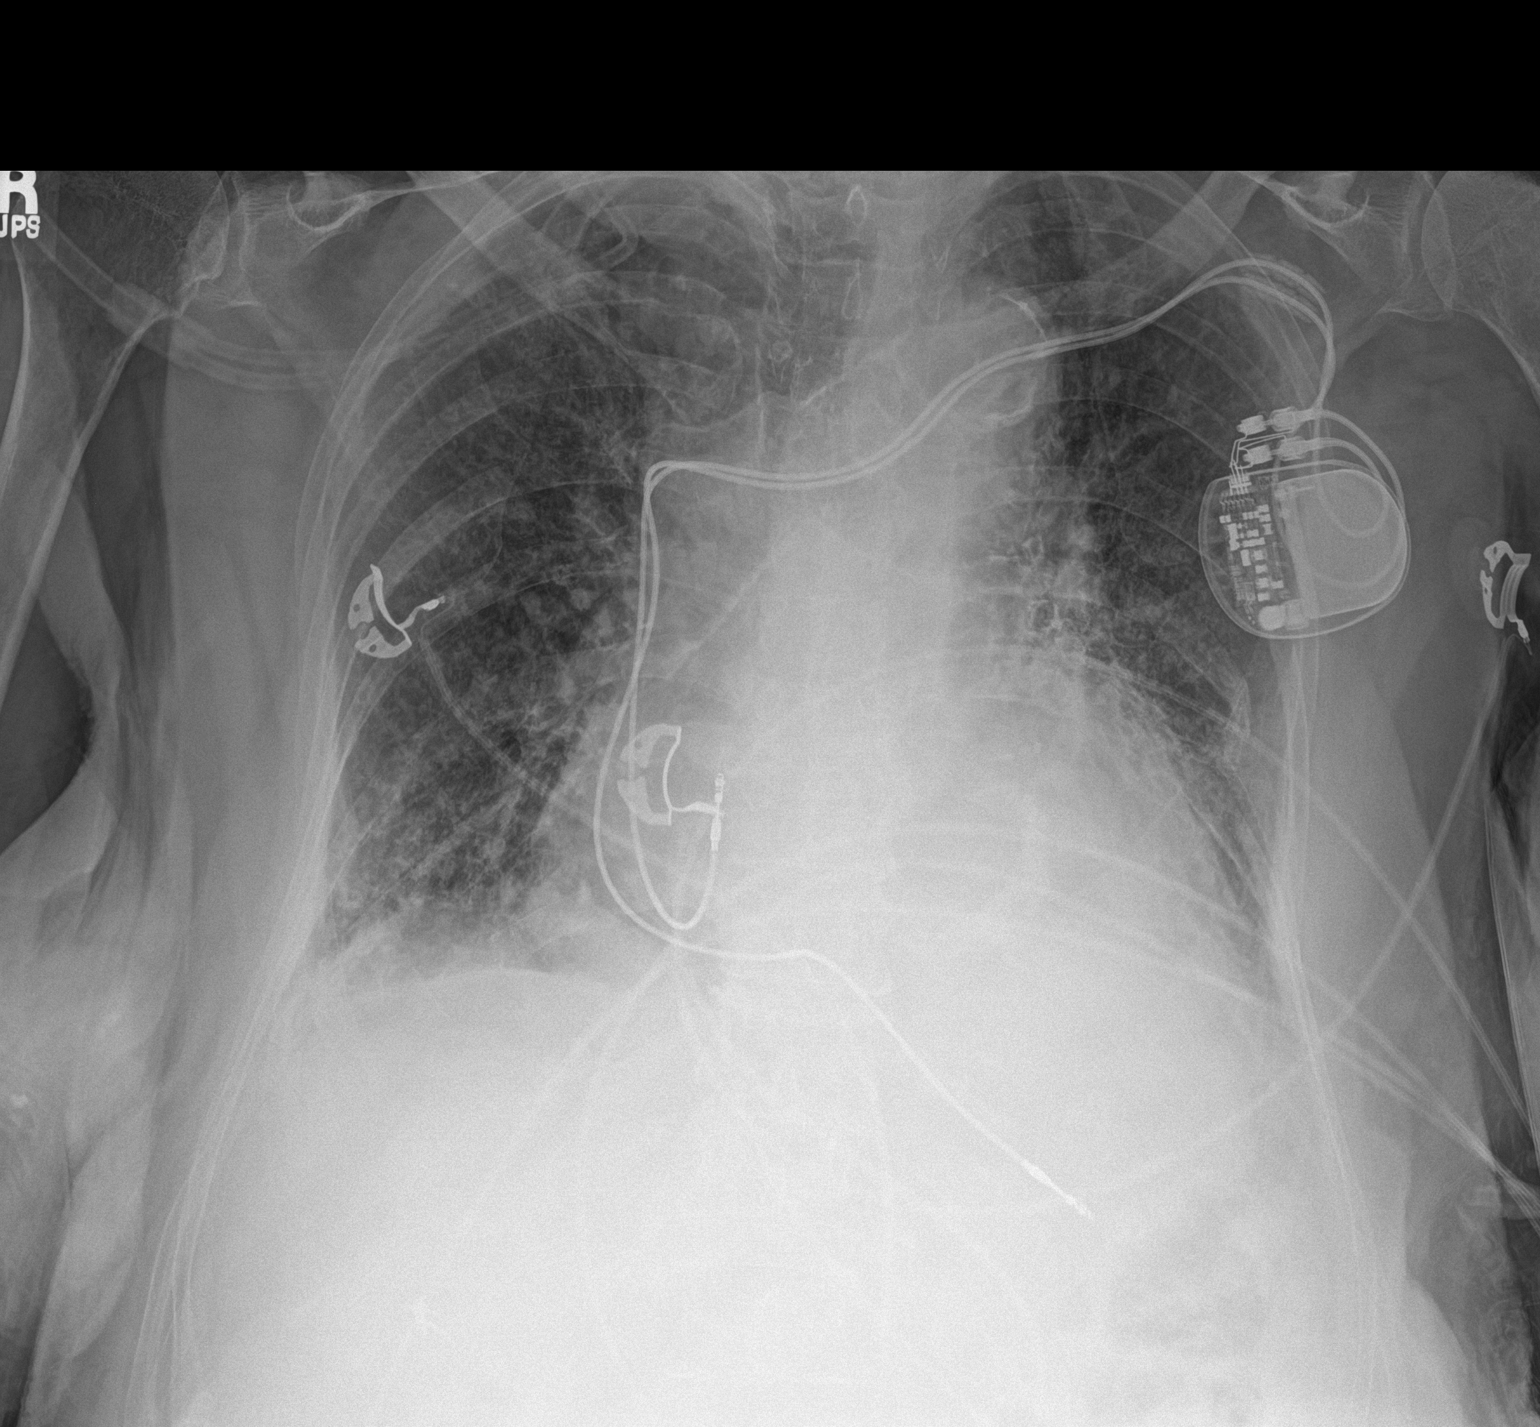

[1 of 1 positions shown; findings below may reference images not displayed]

FINDINGS: There are small pleural effusions bilaterally. There is patchy
consolidation in the left base.

There is cardiomegaly with pulmonary vascularity normal. Pacemaker
leads are attached to the right atrium and right ventricle. No
adenopathy. There is aortic atherosclerosis. No evident bone
lesions.
IMPRESSION: Persistent patchy consolidation left base with small pleural
effusions bilaterally.

Cardiomegaly. Pulmonary vascularity normal. There is aortic
atherosclerosis. Pacemaker leads attached to right atrium and right
ventricle.

Aortic Atherosclerosis ([id number]).
